# Patient Record
Sex: Female | Born: 1945 | Race: White | Hispanic: No | Marital: Married | Smoking: Current some day smoker
Health system: Southern US, Community
[De-identification: ages and names within clinical notes are randomized; demographics above are authoritative.]

## PROBLEM LIST (undated history)

## (undated) DIAGNOSIS — J449 Chronic obstructive pulmonary disease, unspecified: Secondary | ICD-10-CM

## (undated) DIAGNOSIS — B9681 Helicobacter pylori [H. pylori] as the cause of diseases classified elsewhere: Secondary | ICD-10-CM

## (undated) DIAGNOSIS — I2510 Atherosclerotic heart disease of native coronary artery without angina pectoris: Secondary | ICD-10-CM

## (undated) DIAGNOSIS — G894 Chronic pain syndrome: Secondary | ICD-10-CM

## (undated) DIAGNOSIS — G4733 Obstructive sleep apnea (adult) (pediatric): Secondary | ICD-10-CM

## (undated) DIAGNOSIS — K2970 Gastritis, unspecified, without bleeding: Secondary | ICD-10-CM

## (undated) DIAGNOSIS — B1920 Unspecified viral hepatitis C without hepatic coma: Secondary | ICD-10-CM

## (undated) DIAGNOSIS — I10 Essential (primary) hypertension: Secondary | ICD-10-CM

## (undated) DIAGNOSIS — I255 Ischemic cardiomyopathy: Secondary | ICD-10-CM

## (undated) HISTORY — DX: Helicobacter pylori (H. pylori) as the cause of diseases classified elsewhere: B96.81

## (undated) HISTORY — PX: TONSILLECTOMY: SUR1361

## (undated) HISTORY — DX: Chronic obstructive pulmonary disease, unspecified: J44.9

## (undated) HISTORY — DX: Chronic pain syndrome: G89.4

## (undated) HISTORY — DX: Unspecified viral hepatitis C without hepatic coma: B19.20

## (undated) HISTORY — DX: Gastritis, unspecified, without bleeding: K29.70

## (undated) HISTORY — DX: Ischemic cardiomyopathy: I25.5

## (undated) HISTORY — PX: TOTAL VAGINAL HYSTERECTOMY: SHX2548

## (undated) HISTORY — PX: OTHER SURGICAL HISTORY: SHX169

## (undated) HISTORY — DX: Obstructive sleep apnea (adult) (pediatric): G47.33

## (undated) HISTORY — PX: APPENDECTOMY: SHX54

## (undated) SURGERY — COLONOSCOPY WITH PROPOFOL
Anesthesia: Monitor Anesthesia Care

---

## 2002-02-07 HISTORY — PX: CARDIAC DEFIBRILLATOR PLACEMENT: SHX171

## 2002-10-09 DIAGNOSIS — I2510 Atherosclerotic heart disease of native coronary artery without angina pectoris: Secondary | ICD-10-CM

## 2002-10-09 HISTORY — DX: Atherosclerotic heart disease of native coronary artery without angina pectoris: I25.10

## 2002-10-28 ENCOUNTER — Inpatient Hospital Stay (HOSPITAL_COMMUNITY): Admission: EM | Admit: 2002-10-28 | Discharge: 2002-11-04 | Payer: Self-pay | Admitting: Internal Medicine

## 2002-10-29 ENCOUNTER — Encounter: Payer: Self-pay | Admitting: Internal Medicine

## 2002-10-31 ENCOUNTER — Encounter: Payer: Self-pay | Admitting: Internal Medicine

## 2002-12-10 ENCOUNTER — Ambulatory Visit (HOSPITAL_COMMUNITY): Admission: RE | Admit: 2002-12-10 | Discharge: 2002-12-11 | Payer: Self-pay | Admitting: Internal Medicine

## 2004-03-02 ENCOUNTER — Ambulatory Visit: Payer: Self-pay | Admitting: Internal Medicine

## 2004-03-02 ENCOUNTER — Ambulatory Visit: Payer: Self-pay

## 2004-07-15 ENCOUNTER — Ambulatory Visit: Payer: Self-pay | Admitting: Cardiology

## 2004-07-19 ENCOUNTER — Ambulatory Visit: Payer: Self-pay | Admitting: Internal Medicine

## 2004-09-23 ENCOUNTER — Ambulatory Visit: Payer: Self-pay | Admitting: *Deleted

## 2005-01-06 ENCOUNTER — Ambulatory Visit: Payer: Self-pay

## 2005-05-31 ENCOUNTER — Ambulatory Visit: Payer: Self-pay | Admitting: Cardiology

## 2006-02-23 ENCOUNTER — Ambulatory Visit: Payer: Self-pay | Admitting: Cardiology

## 2006-03-20 ENCOUNTER — Ambulatory Visit: Payer: Self-pay | Admitting: Cardiology

## 2006-03-27 ENCOUNTER — Ambulatory Visit: Payer: Self-pay | Admitting: Cardiology

## 2006-04-21 ENCOUNTER — Ambulatory Visit: Payer: Self-pay | Admitting: Internal Medicine

## 2006-04-25 ENCOUNTER — Ambulatory Visit: Payer: Self-pay | Admitting: Cardiology

## 2006-09-07 ENCOUNTER — Encounter: Payer: Self-pay | Admitting: Cardiology

## 2006-09-07 ENCOUNTER — Ambulatory Visit: Payer: Self-pay | Admitting: Cardiology

## 2006-09-11 ENCOUNTER — Ambulatory Visit: Payer: Self-pay | Admitting: Cardiology

## 2006-11-20 ENCOUNTER — Ambulatory Visit: Payer: Self-pay | Admitting: Cardiology

## 2006-11-21 ENCOUNTER — Ambulatory Visit: Payer: Self-pay | Admitting: Cardiology

## 2006-11-21 ENCOUNTER — Encounter: Payer: Self-pay | Admitting: Cardiology

## 2006-12-12 ENCOUNTER — Ambulatory Visit: Payer: Self-pay | Admitting: Cardiology

## 2007-04-27 ENCOUNTER — Ambulatory Visit: Payer: Self-pay | Admitting: Internal Medicine

## 2007-06-19 ENCOUNTER — Ambulatory Visit: Payer: Self-pay | Admitting: Cardiology

## 2008-01-29 ENCOUNTER — Ambulatory Visit: Payer: Self-pay | Admitting: Cardiology

## 2008-03-25 ENCOUNTER — Encounter: Payer: Self-pay | Admitting: Internal Medicine

## 2008-05-03 ENCOUNTER — Encounter: Payer: Self-pay | Admitting: Cardiology

## 2008-06-23 ENCOUNTER — Encounter: Payer: Self-pay | Admitting: Cardiology

## 2008-06-30 ENCOUNTER — Encounter: Payer: Self-pay | Admitting: Cardiology

## 2008-07-14 ENCOUNTER — Encounter: Payer: Self-pay | Admitting: Cardiology

## 2008-11-06 ENCOUNTER — Telehealth: Payer: Self-pay | Admitting: Internal Medicine

## 2008-11-06 DIAGNOSIS — I10 Essential (primary) hypertension: Secondary | ICD-10-CM | POA: Insufficient documentation

## 2008-11-06 DIAGNOSIS — I429 Cardiomyopathy, unspecified: Secondary | ICD-10-CM | POA: Insufficient documentation

## 2008-11-06 DIAGNOSIS — E785 Hyperlipidemia, unspecified: Secondary | ICD-10-CM

## 2008-11-19 ENCOUNTER — Ambulatory Visit: Payer: Self-pay

## 2008-11-20 ENCOUNTER — Ambulatory Visit: Payer: Self-pay | Admitting: Internal Medicine

## 2008-11-27 ENCOUNTER — Ambulatory Visit (HOSPITAL_COMMUNITY): Admission: RE | Admit: 2008-11-27 | Discharge: 2008-11-27 | Payer: Self-pay | Admitting: Internal Medicine

## 2008-11-27 ENCOUNTER — Ambulatory Visit: Payer: Self-pay | Admitting: Internal Medicine

## 2008-12-03 ENCOUNTER — Ambulatory Visit: Payer: Self-pay | Admitting: Internal Medicine

## 2009-01-05 ENCOUNTER — Telehealth (INDEPENDENT_AMBULATORY_CARE_PROVIDER_SITE_OTHER): Payer: Self-pay | Admitting: *Deleted

## 2009-02-11 ENCOUNTER — Encounter: Payer: Self-pay | Admitting: Internal Medicine

## 2009-04-27 ENCOUNTER — Telehealth: Payer: Self-pay | Admitting: Internal Medicine

## 2009-04-28 ENCOUNTER — Encounter (INDEPENDENT_AMBULATORY_CARE_PROVIDER_SITE_OTHER): Payer: Self-pay | Admitting: *Deleted

## 2009-04-28 ENCOUNTER — Ambulatory Visit: Payer: Self-pay | Admitting: Cardiology

## 2009-04-28 DIAGNOSIS — M79609 Pain in unspecified limb: Secondary | ICD-10-CM | POA: Insufficient documentation

## 2009-04-28 DIAGNOSIS — Z95810 Presence of automatic (implantable) cardiac defibrillator: Secondary | ICD-10-CM | POA: Insufficient documentation

## 2009-04-28 DIAGNOSIS — I2510 Atherosclerotic heart disease of native coronary artery without angina pectoris: Secondary | ICD-10-CM | POA: Insufficient documentation

## 2009-04-28 DIAGNOSIS — R072 Precordial pain: Secondary | ICD-10-CM | POA: Insufficient documentation

## 2009-05-05 ENCOUNTER — Encounter: Payer: Self-pay | Admitting: Cardiology

## 2009-05-05 ENCOUNTER — Ambulatory Visit: Payer: Self-pay | Admitting: Cardiology

## 2009-05-11 ENCOUNTER — Encounter (INDEPENDENT_AMBULATORY_CARE_PROVIDER_SITE_OTHER): Payer: Self-pay | Admitting: *Deleted

## 2009-05-13 ENCOUNTER — Telehealth (INDEPENDENT_AMBULATORY_CARE_PROVIDER_SITE_OTHER): Payer: Self-pay | Admitting: *Deleted

## 2009-06-17 ENCOUNTER — Encounter: Payer: Self-pay | Admitting: Internal Medicine

## 2009-08-05 ENCOUNTER — Ambulatory Visit: Payer: Self-pay | Admitting: Internal Medicine

## 2009-11-06 ENCOUNTER — Ambulatory Visit: Payer: Self-pay | Admitting: Internal Medicine

## 2009-11-06 DIAGNOSIS — I472 Ventricular tachycardia: Secondary | ICD-10-CM

## 2009-12-24 ENCOUNTER — Encounter: Payer: Self-pay | Admitting: Cardiology

## 2009-12-25 ENCOUNTER — Ambulatory Visit: Payer: Self-pay | Admitting: Cardiology

## 2009-12-25 ENCOUNTER — Encounter: Payer: Self-pay | Admitting: Physician Assistant

## 2009-12-28 ENCOUNTER — Encounter: Payer: Self-pay | Admitting: Cardiology

## 2010-01-19 ENCOUNTER — Ambulatory Visit: Payer: Self-pay | Admitting: Cardiology

## 2010-01-20 ENCOUNTER — Encounter: Payer: Self-pay | Admitting: Cardiology

## 2010-03-01 ENCOUNTER — Ambulatory Visit: Admit: 2010-03-01 | Payer: Self-pay | Admitting: Internal Medicine

## 2010-03-09 NOTE — Miscellaneous (Signed)
  Clinical Lists Changes  Observations: Added new observation of NUCLEAR NOS: The nuclear images reveal normal uptake in all segments. There is no scar or ischemia. Wall motion is normal. This is a normal stress nuclear study. (05/05/2009 10:18)      Nuclear Study  Procedure date:  05/05/2009  Findings:      The nuclear images reveal normal uptake in all segments. There is no scar or ischemia. Wall motion is normal. This is a normal stress nuclear study.

## 2010-03-09 NOTE — Letter (Signed)
Summary: Appointment -missed  [facility] at [facility] [address]   [place], [place] [address]   Phone: [phone]  Fax: [phone]     April 28, 2009 MRN: [id number]     [facility] [address] [place], [place]  [address]     Dear [name],  Our records indicate you missed your appointment on April 28, 2009                        with Dr.   Andee Lineman.   It is very important that we reach you to reschedule this appointment. We look forward to participating in your health care needs.   Please contact us at the number listed above at your earliest convenience to reschedule this appointment.   Sincerely,    Glass blower/designer

## 2010-03-09 NOTE — Progress Notes (Signed)
Summary: chest pain  Phone Note Call from Patient Call back at [phone]   Caller: Patient Reason for Call: Talk to Nurse Summary of Call: pt having chest pain since last night, SOB, [place] pt, [place] told pt to call here to get appt tomorrow, pt did get cut off in transfer, home number dc in emr, left message on home # in IDX to call back asap Initial call taken by: Migdalia Dk,  April 27, 2009 4:10 PM  Follow-up for Phone Call        attempted to reach pt, home number no longer in service, work # listed states it is full and can not leave mess at this time, home #in idx is [phone], Left message to call back Meredith Staggers, RN  April 27, 2009 4:27 PM   pt called back, c/o sharpe chest pain off/on , she states at the location of her defib, she states also has increased SOB and had to use her CPAP machine last pm, she is not sure if pain is her heart or defib, also states she has been having some trouble w/fluid and has been taking her furosemide, pt would like an appt w/MD for tom will call pt back w/appt, her call back # is  [phone] Meredith Staggers, RN  April 27, 2009 4:37 PM   Additional Follow-up for Phone Call Additional follow up Details #1::        Feel pt needs to be seen by her reg. cardiologist for eval of chest pain and increased SOB, will forward to Thompson Caul, RN  April 27, 2009 4:54 PM     Additional Follow-up for Phone Call Additional follow up Details #2::    Appt. scheduled for tomorrow at 9:00.  Pt. notified.   Follow-up by: Hoover Brunette, LPN,  April 27, 2009 5:11 PM   Appended Document: chest pain Patient no showed for appointment this morning.

## 2010-03-09 NOTE — Letter (Signed)
Summary: Lexiscan or Dobutamine Pharmacist, community at [facility]  [address]   [place], [place] [address]   Phone: [phone]  Fax: [phone]      [facility] Cardiovascular Services  Lexiscan or Dobutamine Cardiolite Strss Test    [facility]  Appointment Date:_  Appointment Time:_  Your doctor has ordered a CARDIOLITE STRESS TEST using a medication to stimulate exercise so that you will not have to walk on the treadmill to determine the condition of your heart during stress. If you take blood pressure medication, ask your doctor if you should take it the day of your test. You should not have anything to eat or drink at least 4 hours before your test is scheduled, and no caffeine, including decaffeinated tea and coffee, chocolate, and soft drinks for 24 hours before your test.  You will need to register at the Outpatient/Main Entrance at the hospital 15 minutes before your appointment time. It is a good idea to bring a copy of your order with you. They will direct you to the Diagnostic Imaging (Radiology) Department.  You will be asked to undress from the waist up and given a hospital gown to wear, so dress comfortably from the waist down for example: Sweat pants, shorts, or skirt Rubber soled lace up shoes (tennis shoes)  Plan on about three hours from registration to release from the hospital

## 2010-03-09 NOTE — Progress Notes (Signed)
Summary: Pending ABI's   Phone Note Outgoing Call Call back at [facility] Phone [phone]   Call placed by: Cyril Loosen, RN, BSN,  May 13, 2009 1:30 PM Call placed to: Patient Summary of Call: Contacted pt regarding appt for dopplers that was scheduled for April 4th. Pt states she did not have this done b/c she has a cold. She would like it rescheduled for one day next week if possible. Notified Lynden Ang will r/s and notify her of date and time.  Initial call taken by: Cyril Loosen, RN, BSN,  May 13, 2009 1:33 PM     Appended Document: Pending ABI's  Called Mrs. Lieurance and left voice message asking that she call me back so that I can re-schedule her test.

## 2010-03-09 NOTE — Letter (Signed)
Summary: Engineer, materials at [facility]  [address]   [place], [place] [address]   Phone: [phone]  Fax: [phone]        May 11, 2009 MRN: [id number]   [facility] [address] [place], [place]  [address]   Dear Ms. Cornfield,  Your test ordered by Selena Batten has been reviewed by your physician (or physician assistant) and was found to be normal or stable. Your physician (or physician assistant) felt no changes were needed at this time.  ____ Echocardiogram  __X__ Cardiac Stress Test  ____ Lab Work  ____ Peripheral vascular study of arms, legs or neck  ____ CT scan or X-ray  ____ Lung or Breathing test  ____ Other:   Thank you.   Hoover Brunette, LPN    Duane Boston, M.D., F.A.C.C. Thressa Sheller, M.D., F.A.C.C. Oneal Grout, M.D., F.A.C.C. Cheree Ditto, M.D., F.A.C.C. Daiva Nakayama, M.D., F.A.C.C. Kenney Houseman, M.D., F.A.C.C. Jeanne Ivan, PA-C

## 2010-03-09 NOTE — Letter (Signed)
Summary: Appointment -missed  [facility] at [facility] [address]   [place], [place] [address]   Phone: [phone]  Fax: [phone]     February 11, 2009 MRN: [id number]     [facility] [address] [place], [place]  [address]     Dear Ms. Gu,  Our records indicate you missed your appointment on February 11, 2009                        with Dr. Abundio Miu.   It is very important that we reach you to reschedule this appointment. We look forward to participating in your health care needs.   Please contact us at the number listed above at your earliest convenience to reschedule this appointment.   Sincerely,    Glass blower/designer

## 2010-03-09 NOTE — Assessment & Plan Note (Signed)
Summary: 3 month in [place] office/sl   Visit Type:  Pacemaker check Primary Provider:  none   History of Present Illness: [name] is a 65 year old female with a history of a mixed ischemic/nonischemic cardiomyopathy. Initially her EF was 19% and she had a Medtronic ICD implant in 2004. Her ejection fraction however is improved to 55% by echo in January 2008. She also normal adenosine Cardiolite study 3/11 with ejection fraction of 59%.  I recently replaced her ICD pulse generator for ERI battery status.   She received a Medtronic Virtuoso VR single chamber cardioverter defibrillator. She does have a 6949 lead and declined replacement of this. She has been made aware of potential defibrillator discharges and has a magnet at home. Presently she is doing well.  She reports no CP, SOB, orthopnea, or PND.   Preventive Screening-Counseling & Management  Alcohol-Tobacco     Smoking Status: quit > 6 months  Current Medications (verified): 1)  Carvedilol 12.5 Mg Tabs (Carvedilol) .... Take 1 Tablet By Mouth Two Times A Day 2)  Furosemide 20 Mg Tabs (Furosemide) .... Take 1 Tablet By Mouth Twice A Day 3)  Lisinopril 2.5 Mg Tabs (Lisinopril) .Marland Kitchen.. 1 Tablet By Mouth Once A Day (Not Compliant) As Needed 4)  Plavix 75 Mg Tabs (Clopidogrel Bisulfate) .... Take 1 Tablet By Mouth Once A Day 5)  Opana Er 20 Mg Xr12h-Tab (Oxymorphone Hcl) .... Take 1 By Mouth Two Times A Day 6)  Flexeril 10 Mg Tabs (Cyclobenzaprine Hcl) .... Take 1 By Mouth Two Times A Day As Needed 7)  Fish Oil 1000 Mg Caps (Omega-3 Fatty Acids) .... Take 2 By Mouth Once Daily 8)  Roxicodone 15 Mg Tabs (Oxycodone Hcl) .... Take 1 Tablet By Mouth Two Times A Day 9)  Centrum Silver  Tabs (Multiple Vitamins-Minerals) .... Take 1 Tablet By Mouth Once A Day 10)  Xanax 1 Mg Tabs (Alprazolam) .... Uad  Allergies (verified): 1)  ! Asa 2)  ! Pcn 3)  ! Lipitor (Atorvastatin) 4)  ! Zocor  Comments:  Nurse/Medical Assistant: The patient is  currently on medications but does not know the name or dosage at this time. Instructed to contact our office with details. Will update medication list at that time.  Past History:  Past Medical History: Reviewed history from 11/20/2008 and no changes required. HYPERTENSION, UNSPECIFIED (ICD-401.9) HYPERLIPIDEMIA-MIXED (ICD-272.4) CARDIOMYOPATHY, SECONDARY (ICD-425.9) Ventricular tachycardia, s/p ICD implantation OSA Chronic pain syndrome.  Heavy narcotic use COPD/ongoing tobacco.   Past Surgical History: Reviewed history from 11/06/2008 and no changes required. forearm reatachment Abdominal Hysterectomy-Total ICD - medtronic marquis  Social History: Reviewed history from 11/06/2008 and no changes required. Married  Tobacco Use - Yes.  Alcohol Use - no Drug Use - no Smoking Status:  quit > 6 months  Review of Systems       All systems are reviewed and negative except as listed in the HPI.   Vital Signs:  Patient profile:   65 year old female Height:      61 inches Weight:      151 pounds Pulse rate:   91 / minute BP sitting:   102 / 65  (left arm) Cuff size:   regular  Vitals Entered By: Carlye Grippe (November 06, 2009 3:51 PM)  Physical Exam  General:  lethargic on exam but interactive Head:  normocephalic and atraumatic Eyes:  meiotic pupils Mouth:  Teeth, gums and palate normal. Oral mucosa normal. Neck:  Neck supple, no JVD. No masses,  thyromegaly or abnormal cervical nodes. Chest Wall:  L ICD pocket is well healed, nontender on exam Lungs:  Clear bilaterally to auscultation and percussion. Heart:  Non-displaced PMI, chest non-tender; regular rate and rhythm, S1, S2 without murmurs, rubs or gallops. Carotid upstroke normal, no bruit. Normal abdominal aortic size, no bruits. Femorals normal pulses, no bruits. Pedals normal pulses. No edema, no varicosities. Abdomen:  Bowel sounds positive; abdomen soft and non-tender without masses, organomegaly, or hernias  noted. No hepatosplenomegaly. Msk:  Back normal, normal gait. L arm s/p surgery with major chronic deformity and chronic pain Pulses:  pulses normal in all 4 extremities Extremities:  No clubbing or cyanosis. Neurologic:  Alert and oriented x 3.    ICD Specifications Following MD:  Hillis Range, MD     ICD Vendor:  Medtronic     ICD Model Number:  [id number]     ICD Serial Number:  [id number] ICD DOI:  11/27/2008     ICD Implanting MD:  Hillis Range, MD  Lead 1:    Location: RV     DOI: 12/10/2002     Model #: [id number]     Serial #: [id number]     Status: active  Indications::  NSVT LIA  Explantation Comments: 11/27/2008 MARQUIS [id number] h explanted.  5621 LEAD REVISION REFUSED  ICD Follow Up Battery Voltage:  3.20 V     Charge Time:  8.7 seconds     Underlying rhythm:  SR ICD Dependent:  No       ICD Device Measurements Right Ventricle:  Amplitude: 9.0 mV, Impedance: 627 ohms, Threshold: 0.50 V at 0.40 msec Shock Impedance: 44/59 ohms   Episodes [name] Episodes:  0     Shock:  0     ATP:  0     Nonsustained:  6     Atrial Therapies:  0 Ventricular Pacing:  <0.1%  Brady Parameters Mode VVI     Lower Rate Limit:  40      Tachy Zones VF:  222     VT1:  182     Next Cardiology Appt Due:  01/11/2010 Tech Comments:  OPTIVOL INCREASE FROM 10-26-09 THRU ONGOING.  6 MONITORED VT EPISODES--LONGEST WAS 6 SECONDS.  CHANGED VF ZONE TO 222 AND VT ZONE TO 182 bpm.  6949 LEAD STABLE. SIC 0.  DEMONSTRATED TONES FOR PT.  PT HAS LETTER AND MAGNET.  ROV IN 3 MTHS W/DEVICE CLINIC. Vella Kohler  November 06, 2009 4:11 PM MD Comments:  Pt had VT (CL 300-330 [name]) which self terminated as well as nonsustained VT in the VF zone (CL 260-244ms) which self terminated.  I have reprogrammed today to turn the VT therapies on for CL <330 msec.  No evidence of fidelis lead issues.  Impression & Recommendations:  Problem # 1:  IMPLANTATION OF DEFIBRILLATOR, HX OF (ICD-V45.02) Normal ICD function. 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 She is euvolemic on exam  no changes today  Problem # 4:  HYPERTENSION, UNSPECIFIED (ICD-401.9) stable  Patient Instructions: 1)  return to the device clinic in 3 months

## 2010-03-09 NOTE — Cardiovascular Report (Signed)
Summary: Card Device Clinic/ INTERROGATION QUICK LOOK  Card Device Clinic/ INTERROGATION QUICK LOOK   Imported By: Dorise Hiss 11/09/2009 11:52:05  _____________________________________________________________________  External Attachment:    Type:   Image     Comment:   External Document

## 2010-03-09 NOTE — Assessment & Plan Note (Signed)
Summary: ROV-CHEST PAIN   Visit Type:  Follow-up Primary Provider:  Hasanaj  CC:  follow-up visit.  History of Present Illness: the patient is a 65 year old femalehistory of a mixed ischemic/nonischemic cardiomyopathy. Initially showed an ejection fraction of 19% and since he received a Medtronic ICD implant in 2004. Her ejection fraction however is improved to 55% by echo in January 2008. She also normal adenosine Cardiolite study with ejection fraction of 50% November 2008. She is a chronic pain syndrome which pain medication addiction. She also COPD with ongoing tobacco use. She statin intolerant.  Patient has a recent generator replacement. She received a Medtronic Virtuoso VR single chamber cardioverter defibrillator. She does have a 6949 lead and declined for placement of this. She has been made aware of potential defibrillator discharges and has a magnet at home X.  The patient currently reports substernal chest pain. She reports no orthopnea PND. She does report increased shortness of breath.she denies any palpitations orthopnea or PND. The patient is very lethargic in the office today.the patient reports pain in the lower extremities upon exertion. She also reports dizziness with carvedilol.  Preventive Screening-Counseling & Management  Alcohol-Tobacco     Smoking Status: current     Smoking Cessation Counseling: yes     Packs/Day: <0.25 PPD  Current Medications (verified): 1)  Carvedilol 12.5 Mg Tabs (Carvedilol) .... Take 1 Tablet By Mouth Two Times A Day 2)  Furosemide 20 Mg Tabs (Furosemide) .... Take 1 Tablet By Mouth Twice A Day 3)  Lisinopril 2.5 Mg Tabs (Lisinopril) .Marland Kitchen.. 1 Tablet By Mouth Once A Day (Not Compliant) As Needed 4)  Plavix 75 Mg Tabs (Clopidogrel Bisulfate) .... Take 1 Tablet By Mouth Once A Day 5)  Opana Er 20 Mg Xr12h-Tab (Oxymorphone Hcl) .... Take 1 By Mouth Two Times A Day 6)  Flexeril 10 Mg Tabs (Cyclobenzaprine Hcl) .... Take 1 By Mouth Two Times A  Day As Needed 7)  Fish Oil 1000 Mg Caps (Omega-3 Fatty Acids) .... Take 2 By Mouth Once Daily 8)  Roxicodone 15 Mg Tabs (Oxycodone Hcl) .... Take 1 Tablet By Mouth Two Times A Day 9)  Centrum Silver  Tabs (Multiple Vitamins-Minerals) .... Take 1 Tablet By Mouth Once A Day  Allergies (verified): 1)  ! Asa 2)  ! Pcn 3)  ! Lipitor (Atorvastatin) 4)  ! Zocor  Comments:  Nurse/Medical Assistant: The patient's medications and allergies were reviewed with the patient and were updated in the Medication and Allergy Lists. Verbally gave names.  Past History:  Past Medical History: Last updated: 11/20/2008 HYPERTENSION, UNSPECIFIED (ICD-401.9) HYPERLIPIDEMIA-MIXED (ICD-272.4) CARDIOMYOPATHY, SECONDARY (ICD-425.9) Ventricular tachycardia, s/p ICD implantation OSA Chronic pain syndrome.  Heavy narcotic use COPD/ongoing tobacco.   Past Surgical History: Last updated: 11/06/2008 forearm reatachment Abdominal Hysterectomy-Total ICD - medtronic marquis  Family History: Last updated: 11/20/2008 CAD  Social History: Last updated: 11/06/2008 Married  Tobacco Use - Yes.  Alcohol Use - no Drug Use - no  Risk Factors: Smoking Status: current (04/28/2009) Packs/Day: <0.25 PPD (04/28/2009)  Social History: Packs/Day:  <0.25 PPD  Review of Systems       The patient complains of chest pain, shortness of breath, and dizziness.  The patient denies fatigue, malaise, fever, weight gain/loss, vision loss, decreased hearing, hoarseness, palpitations, prolonged cough, wheezing, sleep apnea, coughing up blood, abdominal pain, blood in stool, nausea, vomiting, diarrhea, heartburn, incontinence, blood in urine, muscle weakness, joint pain, leg swelling, rash, skin lesions, headache, fainting, depression, anxiety, enlarged lymph nodes, easy  bruising or bleeding, and environmental allergies.    Vital Signs:  Patient profile:   65 year old female Height:      61 inches Weight:      157  pounds O2 Sat:      976 % Pulse rate:   91 / minute BP sitting:   108 / 72  (right arm) Cuff size:   regular  Vitals Entered By: Carlye Grippe (April 28, 2009 1:54 PM) CC: follow-up visit   Physical Exam  Additional Exam:  General: Somnolent white female in no distress head: Normocephalic and atraumatic eyes PERRLA/EOMI intact, conjunctiva and lids normal nose: No deformity or lesions mouth normal dentition, normal posterior pharynx neck: Supple, no JVD.  No masses, thyromegaly or abnormal cervical nodes lungs: Normal breath sounds bilaterally without wheezing.  Normal percussion Chest: Well-seated defibrillator implant without any erythema or swelling heart: regular rate and rhythm with normal S1 and S2, no S3 or S4.  PMI is normal.  No pathological murmurs abdomen: Normal bowel sounds, abdomen is soft and nontender without masses, organomegaly or hernias noted.  No hepatosplenomegaly musculoskeletal: Back normal, normal gait muscle strength and tone normal pulsus: difficult to palpate pulses dorsalis pedis and posterior tibial pulses Extremities: No peripheral pitting edema neurologic: Alert and oriented x 3 skin: Intact without lesions or rashes cervical nodes: No significant adenopathy psychologic: Normal affect     ICD Specifications Following MD:  Hillis Range, MD     ICD Vendor:  Medtronic     ICD Model Number:  [id number]     ICD Serial Number:  [id number] ICD DOI:  11/27/2008     ICD Implanting MD:  Hillis Range, MD  Lead 1:    Location: RV     DOI: 12/10/2002     Model #: [id number]     Serial #: [id number]     Status: active  Indications::  NSVT LIA  Explantation Comments: 11/27/2008 MARQUIS 7230/[id number] h explanted.  4854 LEAD REVISION REFUSED  ICD Follow Up ICD Dependent:  No      Brady Parameters Mode VVI     Lower Rate Limit:  40      Tachy Zones VF:  188     VT1:  162     Impression & Recommendations:  Problem # 1:  LEG PAIN, BILATERAL (ICD-729.5) the  patient has multiple risk factors for peripheral arterial disease. We have scheduled ABIs. 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 Take 1 tablet by mouth two times a day    Lisinopril 2.5 Mg Tabs (Lisinopril) .Marland Kitchen... 1 tablet by mouth once a day (not compliant) as needed    Plavix 75 Mg Tabs (Clopidogrel bisulfate) .Marland Kitchen... Take 1 tablet by mouth once a day  Orders: Nuclear Med (Nuc Med)  Problem # 3:  CARDIOMYOPATHY, SECONDARY (ICD-425.9) the patient is unable to tolerate high-dose carvedilol. She reports dizziness with this. I decreased her dosing to 12.5 milligram p.o. b.i.d. Her updated medication list for this problem includes:    Carvedilol 12.5 Mg Tabs (Carvedilol) .Marland Kitchen... Take 1 tablet by mouth two times a day    Furosemide 20 Mg Tabs (Furosemide) .Marland Kitchen... Take 1 tablet by mouth twice a day  Lisinopril 2.5 Mg Tabs (Lisinopril) .Marland Kitchen... 1 tablet by mouth once a day (not compliant) as needed    Plavix 75 Mg Tabs (Clopidogrel bisulfate) .Marland Kitchen... Take 1 tablet by mouth once a day  Orders: EKG w/ Interpretation (93[address])  Problem # 4:  IMPLANTATION OF DEFIBRILLATOR, HX OF (ICD-V45.02) 6949-lead  Patient Instructions: 1)  Lexiscan on meds 2)  ABI's  3)  Decrease Coreg to 12.5mg  two times a day  4)  Follow up in  6 months Prescriptions: CARVEDILOL 12.5 MG TABS (CARVEDILOL) Take 1 tablet by mouth two times a day  #60 x 6   Entered by:   Hoover Brunette, LPN   Authorized by:   Lewayne Bunting, MD, [facility]   Signed by:   Hoover Brunette, LPN on 25/36/6440   Method used:   Electronically to        [facility] [address] (retail)       [address]       [place], [place]  [address]       Ph: [phone] or [phone]       Fax: [phone]   RxID:   [id number]

## 2010-03-09 NOTE — Assessment & Plan Note (Signed)
Summary: f12m/per [name] Ofc/jss   Visit Type:  Follow-up Primary Provider:  Hasanaj   History of Present Illness: [name] is a 65 year old female with a history of a mixed ischemic/nonischemic cardiomyopathy. Initially her EF was 19% and she had a Medtronic ICD implant in 2004. Her ejection fraction however is improved to 55% by echo in January 2008. She also normal adenosine Cardiolite study 3/11 with ejection fraction of 59%.  I recently replaced her ICD pulse generator for ERI battery status.  She received a Medtronic Virtuoso VR single chamber cardioverter defibrillator. She does have a 6949 lead and declined replacement of this. She has been made aware of potential defibrillator discharges and has a magnet at home. Presently she is doing well.  She reports no CP, SOB, orthopnea, or PND.   Current Medications (verified): 1)  Carvedilol 12.5 Mg Tabs (Carvedilol) .... Take 1 Tablet By Mouth Two Times A Day 2)  Furosemide 20 Mg Tabs (Furosemide) .... Take 1 Tablet By Mouth Twice A Day 3)  Lisinopril 2.5 Mg Tabs (Lisinopril) .Marland Kitchen.. 1 Tablet By Mouth Once A Day (Not Compliant) As Needed 4)  Plavix 75 Mg Tabs (Clopidogrel Bisulfate) .... Take 1 Tablet By Mouth Once A Day 5)  Opana Er 20 Mg Xr12h-Tab (Oxymorphone Hcl) .... Take 1 By Mouth Two Times A Day 6)  Flexeril 10 Mg Tabs (Cyclobenzaprine Hcl) .... Take 1 By Mouth Two Times A Day As Needed 7)  Fish Oil 1000 Mg Caps (Omega-3 Fatty Acids) .... Take 2 By Mouth Once Daily 8)  Roxicodone 15 Mg Tabs (Oxycodone Hcl) .... Take 1 Tablet By Mouth Two Times A Day 9)  Centrum Silver  Tabs (Multiple Vitamins-Minerals) .... Take 1 Tablet By Mouth Once A Day 10)  Xanax 1 Mg Tabs (Alprazolam) .... Uad  Allergies: 1)  ! Asa 2)  ! Pcn 3)  ! Lipitor (Atorvastatin) 4)  ! Zocor  Past History:  Past Medical History: Reviewed history from 11/20/2008 and no changes required. HYPERTENSION, UNSPECIFIED (ICD-401.9) HYPERLIPIDEMIA-MIXED  (ICD-272.4) CARDIOMYOPATHY, SECONDARY (ICD-425.9) Ventricular tachycardia, s/p ICD implantation OSA Chronic pain syndrome.  Heavy narcotic use COPD/ongoing tobacco.   Past Surgical History: Reviewed history from 11/06/2008 and no changes required. forearm reatachment Abdominal Hysterectomy-Total ICD - medtronic marquis  Social History: Reviewed history from 11/06/2008 and no changes required. Married  Tobacco Use - Yes.  Alcohol Use - no Drug Use - no  Vital Signs:  Patient profile:   65 year old female Height:      61 inches Weight:      154 pounds BMI:     29.20 O2 Sat:      91 % Pulse rate:   95 / minute BP sitting:   140 / 80  (left arm)  Vitals Entered By: Laurance Flatten CMA (August 05, 2009 11:26 AM)  Physical Exam  General:  lethargic on exam but interactive Head:  normocephalic and atraumatic Eyes:  meiotic pupils Mouth:  Teeth, gums and palate normal. Oral mucosa normal. Neck:  Neck supple, no JVD. No masses, thyromegaly or abnormal cervical nodes. Chest Wall:  L ICD pocket is well healed, nontender on exam Lungs:  Clear bilaterally to auscultation and percussion. Heart:  Non-displaced PMI, chest non-tender; regular rate and rhythm, S1, S2 without murmurs, rubs or gallops. Carotid upstroke normal, no bruit. Normal abdominal aortic size, no bruits. Femorals normal pulses, no bruits. Pedals normal pulses. No edema, no varicosities. Abdomen:  Bowel sounds positive; abdomen soft and non-tender without masses,  organomegaly, or hernias noted. No hepatosplenomegaly. Msk:  Back normal, normal gait. Muscle strength and tone normal. Pulses:  pulses normal in all 4 extremities Extremities:  No clubbing or cyanosis.   EKG  Procedure date:  08/05/2009  Findings:      sinus rhythm 89 bpm, nonspecific ST/T changes   ICD Specifications Following MD:  [name] Range, MD     ICD Vendor:  Medtronic     ICD Model Number:  [id number]     ICD Serial Number:  [id number] H ICD DOI:   11/27/2008     ICD Implanting MD:  [name] Range, MD  Lead 1:    Location: RV     DOI: 12/10/2002     Model #: [id number]     Serial #: [id number]     Status: active  Indications::  NSVT LIA  Explantation Comments: 11/27/2008 MARQUIS [id number] h explanted.  5621 LEAD REVISION REFUSED  ICD Follow Up Remote Check?  No Battery Voltage:  3.20 V     Charge Time:  8.7 seconds     ICD Dependent:  No       ICD Device Measurements Right Ventricle:  Amplitude: 7.4 mV, Impedance: 589 ohms, Threshold: 0.5 V at 0.4 msec Shock Impedance: 40/55 ohms   Episodes Shock:  0     ATP:  0     Nonsustained:  23     Ventricular Pacing:  <0.1%  Brady Parameters Mode VVI     Lower Rate Limit:  40      Tachy Zones VF:  200     VT1:  OFF     Next Cardiology Appt Due:  10/08/2009 Tech Comments:  Normal device function.  No changes made today.  Longest NSVT episode 10 seconds.  Pt without symptoms.  [phone] lead stable.  ROV 3 months [facility]. Gypsy Balsam, RN, BSN MD Comments:  agree  Impression & Recommendations:  Problem # 1:  CARDIOMYOPATHY, SECONDARY (ICD-425.9) Stable without significant volume overload on exam.   She has chronic systolic dysfunction, though a recent myoview was without evidence of ischemia and revealed normalization of EF. Normal ICD function today.  No changes  Problem # 2:  HYPERTENSION, UNSPECIFIED (ICD-401.9) stable  Patient Instructions: 1)  Your physician recommends that you schedule a follow-up appointment in: 3 months with Dr Johney Frame in [place] Prescriptions: LISINOPRIL 2.5 MG TABS (LISINOPRIL) 1 tablet by mouth once a day (NOT COMPLIANT) as needed  #30 x 6   Entered by:   Dennis Bast, RN, BSN   Authorized by:   [name] Range, MD   Signed by:   Dennis Bast, RN, BSN on 08/05/2009   Method used:   Electronically to        [facility] [address] (retail)       [address]       [place], [place]  [address]       Ph: [phone] or  [phone]       Fax: [phone]   RxID:   [id number]

## 2010-03-11 NOTE — Letter (Signed)
Summary: [facility] D/C DR. HASANAJ  [facility] D[facility]C DR. HASANAJ   Imported By: Zachary George 01/19/2010 08:43:13  _____________________________________________________________________  External Attachment:    Type:   Image     Comment:   External Document

## 2010-03-11 NOTE — Consult Note (Signed)
Summary: CARDIOLOGY CONSULT/ [facility]  CARDIOLOGY CONSULT/ [facility]   Imported By: Zachary George 01/19/2010 08:38:52  _____________________________________________________________________  External Attachment:    Type:   Image     Comment:   External Document

## 2010-03-11 NOTE — Letter (Signed)
Summary: Appointment -missed  [facility] [address]   [place], [place] [address]   Phone: [phone]  Fax: [phone]     January 19, 2010 MRN: [id number]    [facility] [address] [place], [place]  [address]    Dear Ms. Horsfall,  Our records indicate you missed your appointment on January 19, 2010                        with Dr.  Andee Lineman.   It is very important that we reach you to reschedule this appointment. We look forward to participating in your health care needs.   Please contact us at the number listed above at your earliest convenience to reschedule this appointment.   Sincerely,    Glass blower/designer

## 2010-03-12 ENCOUNTER — Telehealth (INDEPENDENT_AMBULATORY_CARE_PROVIDER_SITE_OTHER): Payer: Self-pay | Admitting: *Deleted

## 2010-03-17 NOTE — Progress Notes (Signed)
Summary: Wants to know name of company to help in home  Phone Note Call from Patient Call back at [phone]   Summary of Call: Pt left message on voicemail stating she would like to know the phone number to an agency that helps in the home.   Returned call but received voicemail that was full.  Initial call taken by: Cyril Loosen, RN, BSN,  March 12, 2010 11:59 AM  Follow-up for Phone Call        Pt wanted to know an agency who could help in the home. Notified pt should could contact counsel on aging (or it's equivalent in [place]) for this.  Pt wanted to know if Gene had found out why her ICD fired when she was in the hospital several months ago. Pt notified she should schedule f/u appts with Drs Earnestine Leys and Allred as she no showed for her last appts with both providers.  Pt scheduled with Dr. Johney Frame on Monday, March 5th at 2pm and Dr. Earnestine Leys on Wednesday, April 21, 2010 at 10:30am. Pt confirmed both appts. Follow-up by: Cyril Loosen, RN, BSN,  March 12, 2010 12:09 PM

## 2010-04-12 ENCOUNTER — Encounter: Payer: Self-pay | Admitting: Internal Medicine

## 2010-04-12 ENCOUNTER — Encounter (INDEPENDENT_AMBULATORY_CARE_PROVIDER_SITE_OTHER): Payer: Medicare Other | Admitting: Internal Medicine

## 2010-04-12 DIAGNOSIS — I428 Other cardiomyopathies: Secondary | ICD-10-CM

## 2010-04-12 DIAGNOSIS — F17200 Nicotine dependence, unspecified, uncomplicated: Secondary | ICD-10-CM | POA: Insufficient documentation

## 2010-04-12 DIAGNOSIS — I10 Essential (primary) hypertension: Secondary | ICD-10-CM

## 2010-04-12 DIAGNOSIS — I5022 Chronic systolic (congestive) heart failure: Secondary | ICD-10-CM

## 2010-04-20 NOTE — Assessment & Plan Note (Signed)
Summary: DEVICE CHECK-NO SHOWED FOR LAST APPT-MEDTRONIC-JM   Visit Type:  Follow-up Primary Provider:  Dr. Olena Leatherwood   History of Present Illness: The patient presents today for routine electrophysiology followup. She reports doing very well since last being seen in our clinic.  She did receive ICD shocks for T wave oversensing 11/11.  Her sensitivity was adjusted to 0.6 (DFT testing done at 1.68mv) and this has resolved.  The patient denies symptoms of palpitations, chest pain, shortness of breath, orthopnea, PND, lower extremity edema, dizziness, presyncope, syncope, or neurologic sequela. The patient is tolerating medications without difficulties and is otherwise without complaint today.   Preventive Screening-Counseling & Management  Alcohol-Tobacco     Smoking Status: current     Packs/Day: 1/day - 1 / week  Comments: very seldon  Current Medications (verified): 1)  Carvedilol 12.5 Mg Tabs (Carvedilol) .... Take 1 Tablet By Mouth Two Times A Day 2)  Furosemide 20 Mg Tabs (Furosemide) .... Take 1 Tablet By Mouth Twice A Day 3)  Lisinopril 2.5 Mg Tabs (Lisinopril) .Marland Kitchen.. 1 Tablet By Mouth Once A Day (Not Compliant) As Needed 4)  Plavix 75 Mg Tabs (Clopidogrel Bisulfate) .... Take 1 Tablet By Mouth Once A Day 5)  Opana Er 20 Mg Xr12h-Tab (Oxymorphone Hcl) .... Take 1 By Mouth Two Times A Day 6)  Flexeril 10 Mg Tabs (Cyclobenzaprine Hcl) .... Take 1 By Mouth Two Times A Day As Needed 7)  Fish Oil 1000 Mg Caps (Omega-3 Fatty Acids) .... Take 2 By Mouth Once Daily 8)  Roxicodone 15 Mg Tabs (Oxycodone Hcl) .... Take One Tab Four X Day 9)  Centrum Silver  Tabs (Multiple Vitamins-Minerals) .... Take 1 Tablet By Mouth Once A Day 10)  Xanax 1 Mg Tabs (Alprazolam) .... Take One Tab Two Times A Day To Three Times A Day As Needed  Allergies (verified): 1)  ! Asa 2)  ! Pcn 3)  ! Lipitor (Atorvastatin) 4)  ! Zocor  Past History:  Past Medical History: Reviewed history from 11/20/2008 and no  changes required. HYPERTENSION, UNSPECIFIED (ICD-401.9) HYPERLIPIDEMIA-MIXED (ICD-272.4) CARDIOMYOPATHY, SECONDARY (ICD-425.9) Ventricular tachycardia, s/p ICD implantation OSA Chronic pain syndrome.  Heavy narcotic use COPD/ongoing tobacco.   Past Surgical History: Reviewed history from 11/06/2008 and no changes required. forearm reatachment Abdominal Hysterectomy-Total ICD - medtronic marquis  Social History: Reviewed history from 11/06/2008 and no changes required. Married  Tobacco Use - Yes.  Alcohol Use - no Drug Use - no Smoking Status:  current Packs/Day:  1/day - 1 / week  Vital Signs:  Patient profile:   65 year old female Weight:      153.75 pounds Pulse rate:   84 / minute BP sitting:   150 / 86  (right arm) Cuff size:   regular  Vitals Entered By: Hoover Brunette, LPN (April 11, 1608 2:42 PM) Is Patient Diabetic? No   Physical Exam  General:  lethargic on exam but interactive Head:  normocephalic and atraumatic Eyes:  meiotic pupils Mouth:  Teeth, gums and palate normal. Oral mucosa normal. Neck:  Neck supple, no JVD. No masses, thyromegaly or abnormal cervical nodes. Chest Wall:  L ICD pocket is well healed, nontender on exam Lungs:  Clear bilaterally to auscultation and percussion. Heart:  Non-displaced PMI, chest non-tender; regular rate and rhythm, S1, S2 without murmurs, rubs or gallops. Carotid upstroke normal, no bruit. Normal abdominal aortic size, no bruits. Femorals normal pulses, no bruits. Pedals normal pulses. No edema, no varicosities. Abdomen:  Bowel  sounds positive; abdomen soft and non-tender without masses, organomegaly, or hernias noted. No hepatosplenomegaly. Msk:  Back normal, normal gait. L arm s/p surgery with major chronic deformity and chronic pain Extremities:  No clubbing or cyanosis. Neurologic:  Alert and oriented x 3. Skin:  Intact without lesions or rashes. Psych:  Normal affect.    ICD Specifications Following MD:  Hillis Range, MD     ICD Vendor:  Medtronic     ICD Model Number:  [id number]     ICD Serial Number:  [id number] H ICD DOI:  11/27/2008     ICD Implanting MD:  Hillis Range, MD  Lead 1:    Location: RV     DOI: 12/10/2002     Model #: [id number]     Serial #: [id number]     Status: active  Indications::  NSVT LIA  Explantation Comments: 11/27/2008 MARQUIS 7230/[id number] h explanted.  5621 LEAD REVISION REFUSED  ICD Follow Up ICD Dependent:  No      Brady Parameters Mode VVI     Lower Rate Limit:  40      Tachy Zones VF:  222     VT1:  182     MD Comments:  see scanned report  Impression & Recommendations:  Problem # 1:  CARDIOMYOPATHY, SECONDARY (ICD-425.9) The patient has chronic systolic dysfunction.  Her EF has normalized.  No CHF on exam.  Problem # 2:  IMPLANTATION OF DEFIBRILLATOR, HX OF (ICD-V45.02) Normal ICD function today.  [phone] lead is working appropriately.  She did receive ICD shocks 11/11 for T wave oversensing.  Her sensitivity has been adjusted to 0.36ms (DFT testing at 1.63ms).  I also reprogrammed to single zone given normalization of EF to hopefully prevent inappropriate ICD shocks  Problem # 3:  TOBACCO ABUSE (ICD-305.1) cessation advised  Patient Instructions: 1)  Return to device clinic in 3 months 2)  follow-up with Dr Andee Lineman as scheduled

## 2010-04-21 ENCOUNTER — Ambulatory Visit: Payer: Medicare Other | Admitting: Cardiology

## 2010-04-27 NOTE — Cardiovascular Report (Signed)
Summary: Card Device Clinic/  SESSION SUMMARY  Card Device Clinic/  SESSION SUMMARY   Imported By: Dorise Hiss 04/21/2010 09:30:50  _____________________________________________________________________  External Attachment:    Type:   Image     Comment:   External Document

## 2010-05-13 LAB — BASIC METABOLIC PANEL
BUN: 12 mg/dL (ref 6–23)
CO2: 33 mEq/L — ABNORMAL HIGH (ref 19–32)
Calcium: 9.3 mg/dL (ref 8.4–10.5)
Chloride: 98 mEq/L (ref 96–112)
Creatinine, Ser: 1.05 mg/dL (ref 0.4–1.2)
GFR calc Af Amer: >60 mL/min (ref >60)
GFR calc non Af Amer: 53 mL/min — ABNORMAL LOW (ref >60)
Glucose, Bld: 96 mg/dL (ref 70–99)
Potassium: 4.2 mEq/L (ref 3.5–5.1)
Sodium: 140 mEq/L (ref 135–145)

## 2010-05-13 LAB — CBC
HCT: 42.4 % (ref 36.0–46.0)
Hemoglobin: 14.4 g/dL (ref 12.0–15.0)
MCHC: 34 g/dL (ref 30.0–36.0)
MCV: 87.7 fL (ref 78.0–100.0)
Platelets: 179 10*3/uL (ref 150–400)
RBC: 4.83 MIL/uL (ref 3.87–5.11)
RDW: 13.2 % (ref 11.5–15.5)
WBC: 7.3 10*3/uL (ref 4.0–10.5)

## 2010-06-17 ENCOUNTER — Other Ambulatory Visit: Payer: Self-pay | Admitting: Cardiology

## 2010-06-22 NOTE — Assessment & Plan Note (Signed)
[facility]                          [facility] NOTE   [name], [name]                    MRN:          [id number]  DATE:06/19/2007                            DOB:          05/02/1945    REFERRING PHYSICIAN:  Lia Hopping   HISTORY OF PRESENT ILLNESS:  The patient is a very pleasant 65 year old  female with a history of mixed ischemic and nonischemic cardiomyopathy  with improved ejection fraction to 55% status post ICD implant.  The  patient doing well.  She reports no chest pain, shortness of breath,  orthopnea, PND.  The patient previously described pain across the ICD  implant which has much improved.  Unfortunately, she has resumed tobacco  use and is requesting Chantix today.   MEDICATIONS:  Listed in the chart and include:  1. Plavix 75 mg p.o. daily.  2. Coreg 25 mg b.i.d.  3. Percocet.  4. OxyContin.  5. Lasix.  6. Lisinopril.  7. Fish oil.  8. _________.  9. Vitamin E.  10.Calcium.  11.Multivitamin.  12.Xanax.   PHYSICAL EXAMINATION:  VITAL SIGNS:  Blood pressure 122/73, heart rate  74, weight 160 pounds.  NECK:  Normal carotid upstrokes.  No carotid bruits.  LUNGS:  Clear.  HEART:  Regular rate and rhythm.  Normal S1 and S2.  ABDOMEN:  Soft.  EXTREMITIES:  No clubbing, cyanosis, or edema.   PROBLEM LIST:  1. Atypical chest pain, resolved.  2. History of vasovagal syncope.  3. Mixed ischemic and nonischemic cardiomyopathy with improved      ejection fraction.  4. Coronary artery disease with negative adenosine Cardiolite.  Last      study February of 2008.  5. Status post implantable cardioverter-defibrillator implant.  6. Dyslipidemia.  7. Chronic obstructive pulmonary disease.  8. Sleep apnea.  9. Hypertension.  10.Normal renal arteries by catheterization.  11.Chronic pain syndrome.   PLAN:  1. The patient is doing remarkably well.  She has no further pain      across the ICD implant.  2. I have  given the patient a prescription for Chantix.  3. No further cardiovascular testing is required at the present time.     Learta Codding, MD,FACC  Electronically Signed    GED/MedQ  DD: 06/19/2007  DT: 06/19/2007  Job #: [id number]   cc:   Lia Hopping
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
## 2010-06-22 NOTE — Cardiovascular Report (Signed)
[facility]                   [place] ELECTROPHYSIOLOGY DEVICE CLINIC NOTE   [name], [name]                    MRN:          [id number]  DATE:11/20/2006                            DOB:          1945-06-07    [name] was seen today in the [facility] for followup of  her Medtronic ICD.  She has a Marquis model [id number] that was implanted on  December 10, 2002, for ventricular tachycardia.   Interrogation of her device demonstrates,  The presence of a 69/49 lead which was stable.  Her R waves measured 7.2 millivolts with an RV impedance of 544 ohms and  a threshold of 1 volt at 0.2 milliseconds.  Her shock impedances were 47 and 60 ohms.  Her battery voltage was 2.96 volts with a charge time of 7.96 seconds.  She was in a normal sinus rhythm today.  She had 11 nonsustained episodes which ranged from 5-9 beats.  She had  not had any episodes that had been treated by her device.  She is programmed as a one zone VF device at 188 beats per minute.  She is currently V pacing less than 0.1% of the time.  I was unable to install the lead integrity alert on her device because  she has a Psychologist, sport and exercise and the older models do not accept the software.   An appointment was made for her today to return to the clinic in 3  months in [place].      Gypsy Balsam, RN,BSN  Electronically Signed      Doylene Canning. Ladona Ridgel, MD  Electronically Signed   AS/MedQ  DD: 11/20/2006  DT: 11/20/2006  Job #: [id number]
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
## 2010-06-25 NOTE — Discharge Summary (Signed)
[name], [name]                       ACCOUNT NO.:  [id number]   MEDICAL RECORD NO.:  [id number]                   PATIENT TYPE:  OIB   LOCATION:  6529                                 FACILITY:  [facility]   PHYSICIAN:  Doylene Canning. Ladona Ridgel, M.D.               DATE OF BIRTH:  1945-02-17   DATE OF ADMISSION:  12/10/2002  DATE OF DISCHARGE:  12/11/2002                                 DISCHARGE SUMMARY   PRIMARY DIAGNOSIS:  Ischemic cardiomyopathy with nonsustained ventricular  tachycardia.   SECONDARY DIAGNOSES:  1. Hyperlipidemia.  2. Bilateral renal insufficiency.  3. Class II congestive heart failure.  4. Positive tobacco abuse.   This is a 65 year old female who was initially admitted to the hospital  transferred from [facility] for evaluation of chest pain and congestive heart  failure.  During her hospitalization she had nonsustained ventricular  tachycardia.  Because of concerns of ischemic heart disease with her chest  pain and nonsustained VT in the setting of left ventricular dysfunction she  underwent a heart catheterization which demonstrated mildly- to moderately-  elevated pulmonary pressures, modest coronary disease - specifically, she  had a diffuse stenosis 70% stenosis and an 80% ostial diagonal stenosis; the  left circumflex artery was free of significant disease; right coronary had a  tubular 75% stenosis in the mid region with two-vessel coronary disease.  She was considered for operative therapy but her LV function was severely  impaired with an EF of 19%.  She underwent an adenosine thallium stress test  demonstrating inferior scar but no clear-cut areas of viability or ischemia.  She was on heart failure medications and returns for follow-up.  Overall she  feels better than she did prior hospitalization.  Heart failure presently is  a class I to II.  She denies syncope or near-syncope or palpitations.  The  patient underwent a P-wave alternans path per the  Masters trial and was  admitted for placement of a Medtronic-type defibrillator.  She underwent  placement of a Medtronic defibrillator on December 10, 2002, tolerated the  procedure well, had no immediate postoperative complications.  Chest x-ray  showed the lead to be in the RV with no pneumothorax.  Values were within  normal limits.  Threshold R wave was 10.5 volts, impendence 608 ohms, HBB  was 41 or HVX 54 ohms, threshold was 1.01 volts at 0.1 milliseconds.  She  was discharged on all of her previous medications:  1. Plavix 75 daily.  2. Coreg 6.25 b.i.d.  3. Altace 2.5 daily.  4.     Zocor 20 nightly.  5. Lipitor 10 daily.  6. All of her previous pain medications.      Chinita Pester, C.R.N.P. [facility]                 Doylene Canning. Ladona Ridgel, M.D.    DS/MEDQ  D:  12/11/2002  T:  12/12/2002  Job:  [id number]  cc:   [facility]   Evlyn Courier, M.D.  [place], [place]   [facility] [employer] in [place]

## 2010-06-25 NOTE — Assessment & Plan Note (Signed)
[facility]                          [place] CARDI[facility] OFFICE NOTE   [name], [name]                    MRN:          [id number]  DATE:01/30/2008                            DOB:          11/21/1945    PRIMARY CARDIOLOGIST:  Learta Codding, MD, [facility]   REASON FOR VISIT:  Scheduled followup.   HISTORY OF PRESENT ILLNESS:  [name] returns to the clinic for  scheduled followup, essentially reporting no significant change from her  baseline symptoms.  Unfortunately, she continues to smoke, but is  currently down to 2-3 cigarettes a day.  She states that the Chantix  that Dr. Andee Lineman prescribed for her earlier this year did help,  initially.  However, she was not able to tolerate the recommended higher  doses.  Consequently, she stopped taking the medication and resumed  smoking.  However, she does report that for the initial period that she  was on this medication, she did stop smoking altogether.   With respect to her chronic symptoms of exertional dyspnea, these are  essentially stable and unchanged from her previous visit.   The patient denies any firing of her defibrillator device.   CURRENT MEDICATIONS:  1. Plavix 75 mg daily.  2. Coreg 25 mg b.i.d.  3. OxyContin 40 mg t.i.d.  4. Lasix 20 mg b.i.d.  5. Lisinopril 40 mg daily.  6. Fish oil 1 g daily.  7. Seroquel 200 mg nightly.  8. Xanax 0.25 mg t.i.d.  9. Percocet p.r.n.  10.CPAP.   PHYSICAL EXAMINATION:  VITAL SIGNS:  Blood pressure 134/72, pulse 60 and  regular, and weight 155 (down 5 pounds).  GENERAL:  A 65 year old female, sitting upright, in no distress.  HEENT:  Normocephalic, atraumatic.  NECK:  Palpable carotid pulses without bruits.  No JVD.  LUNGS:  Clear to auscultation in all fields.  HEART:  Regular rate and rhythm.  No significant murmurs.  ABDOMEN:  Benign.  EXTREMITIES:  No significant edema.  NEUROLOGIC:  Mildly anxious, but no focal deficit.   IMPRESSION:  1. Mixed cardiomyopathy.      a.     Ischemic/nonischemic      b.     Improved LVF (EF 55%) by 2-D echo, January 2008.      c.     Previous EF of 19%.      d.     Status post Medtronic ICD implantation in 2004, for primary       prevention.      e.     Normal adenosine Cardiolite; EF 58%, November 2008.  2. Chronic pain syndrome.  3. COPD/ongoing tobacco.  4. Hypertension.  5. Dyslipidemia.      a.     Statin intolerance.  6. Sleep apnea.  7. ASPIRIN allergy.   PLAN:  1. Continue current medication regimen.  In addition, we have agreed      to comply with her request to provide her with a 1-week supply of      OxyContin at the current dose, given that her primary physician is      on vacation.  I told her that I will be willing to do this, but      with a limited supply consisting only of enough to last for the      next 7 days, without any refills.  She is to otherwise follow up      with Dr. Olena Leatherwood for continuation of this medication.  2. The patient has also expressed a desire in being rechallenged with      Chantix, albeit at the initial starting dose of 0.5 mg.  She was      not able to tolerate the advised higher doses secondary to      significant headache.  I will, therefore, prescribe for her this      low dose to be continued throughout the recommended 3 months.  3. Schedule return clinic follow up with myself and Dr. Andee Lineman in 6      months, or sooner if needed.  The patient is to otherwise follow up      with Dr. Sherryl Manges here in the [facility], as scheduled.      Gene Serpe, PA-C  Electronically Signed      Learta Codding, MD,FACC  Electronically Signed   GS/MedQ  DD: 01/30/2008  DT: 01/31/2008  Job #: [id number]   cc:   [name]

## 2010-06-25 NOTE — Assessment & Plan Note (Signed)
[facility]                         ELECTROPHYSIOLOGY OFFICE NOTE   LISANNE, PONCE                    MRN:          [id number]  DATE:04/21/2006                            DOB:          1945/09/11    Ms. Delafuente is a 65 year old woman with a history of a mixed  cardiomyopathy, previously implanted defibrillator for nonsustained  ventricular tachycardia in the setting of this myopathy with an ejection  fraction of 20%.  She has never had appropriate ICD therapy.  She does  have a 6949 lead implanted.  Her last evaluation of her ejection  fraction in January of 2008 demonstrated, by echo, 55%.  She denies  chest pain, shortness of breath.  She has stopped smoking.   MEDICATIONS:  1. Plavix 425 b.i.d.  2. Percocet and OxyContin for chronic pain.  3. Lasix 20 b.i.d.  4. Lisinopril 20.  5. Fish oil.  6. Ambien h.s.   EXAMINATION:  She is an elderly female appearing somewhat older than her  stated age of 8.  Her blood pressure was recorded but I cannot find it.  NECK VEINS:  Flat.  CAROTIDS:  Brisk and full bilaterally without bruits.  BACK:  Without kyphosis, scoliosis.  LUNGS:  Clear.  HEART SOUNDS:  Regular.  EXTREMITIES:  Without edema.   Interrogation of her Medtronic 7230 ICD demonstrates an R wave of 8.8  with an impedance of 560, a threshold of 1 volt at 0.4, battery voltage  3, there were no intercurrent therapies, there were 5 intercurrent  episodes.   IMPRESSION:  1. Nonsustained ventricular tachycardia.  2. Ischemic and nonischemic cardiomyopathy, previously with an      ejection fraction of 20%, now at 55.  3. Status post implantable cardioverter-defibrillator for the above.  4. Cigarette cessation - hooray.  5. Chronic narcotic use for her pain.   Ms. Waitman is stable from an arrhythmia point of view and a  cardiovascular point of view; she is trying to establish with primary  care.  I think it is very important, it  would be cognizant of her  narcotic use and I would defer filling these prescriptions to primary  care.     Duke Salvia, MD, [facility]  Electronically Signed    SCK/MedQ  DD: 04/21/2006  DT: 04/23/2006  Job #: [id number]
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
## 2010-06-25 NOTE — Op Note (Signed)
NAMEEMAAN, GARY                       ACCOUNT NO.:  [id number]   MEDICAL RECORD NO.:  [id number]                   PATIENT TYPE:  OIB   LOCATION:  6529                                 FACILITY:  [facility]   PHYSICIAN:  Doylene Canning. Ladona Ridgel, M.D.               DATE OF BIRTH:  1945/04/21   DATE OF PROCEDURE:  12/10/2002  DATE OF DISCHARGE:                                 OPERATIVE REPORT   PROCEDURE PERFORMED:  Insertion of a single chamber  defibrillator.   INDICATIONS FOR PROCEDURE:  Paroxysmal nonsustained ventricular tachycardia,  status post myocardial infarction with ischemic cardiomyopathy and an  ejection fraction of 19%.   INTRODUCTION:  The patient is a 65 year old woman with a history of  coronary artery disease, status post  myocardial infarction. She has severe  left ventricular dysfunction which has been sustained. She has had a  diaphragmatic MI with an ejection fraction of 19%. She is now referred for  ICD insertion as part of the master trial.   DESCRIPTION OF PROCEDURE:  After informed consent was obtained the patient  was taken to the diagnostic electrophysiology laboratory  in a fasting  state. After the usual prepping and draping, intravenous Fentanyl and  midazolam was given for sedation. A total of 30 mL of Lidocaine was  infiltrated into the left infraclavicular region.   A 10-cm incision was carried out over this region and electrocautery was  utilized to dissect down to the fascial plane. The left subclavian vein was  punctured, and the Medtronic model 6949-65 cm active fixation  defibrillation lead serial number [id number] was advanced into the right  ventricle. Mapping was carried out in the right ventricle and at the RV  apex. The R-waves through the analyzer measured 15 millivolts with a pacing  threshold of 0.5 volts at 0.5 milliseconds once the lead was actively fixed.  The pacing impedance was 833 ohms; 10 volt pacing did not stimulate the  diaphragm.   With the ventricular lead in satisfactory position, it was secured to the  subpectoralis fascia with a figure-of-8 silk suture. The sewing sleeve was  secured with silk suture. Electrocautery was then utilized to make a  subcutaneous pocket. Kanamycin irrigation was utilized to irrigate the  pocket and electrocautery was utilized to assure hemostasis.   The Medtronic [place] VR model [id number], serial number [id number] was connected  to the fibrillation lead and placed in the subcutaneous pocket. The  generator was secured with a silk suture. At this point  defibrillation  threshold testing was carried out.   After the patient was more deeply sedated with a combination of Fentanyl,  Valium and Versed,  defibrillation threshold testing was carried out. Of  note the patient required a total of 350 micrograms of Fentanyl, 30 mg of  Valium, and 22 mg of Versed to achieve adequate sedation for  defibrillation  testing. Ventricular fibrillation was induced with T-wave shock and  a 14  joule shock was delivered, terminating ventricular fibrillation, restoring  sinus rhythm.   Five minutes was allowed to elapse and a 2nd DFT test was carried out. Again  ventricular fibrillation was induced with a T-wave shock and a 14 joule  shock terminated ventricular fibrillation, restoring sinus rhythm. At this  point no additional  defibrillation threshold testing was carried out and  the incision was closed with a layer of 2-0 Vicryl followed  by a layer of 3-  0 Vicryl, followed  by a layer of 4-0 Vicryl.   Benzoin was painted on the skin, Steri-Strips were applied and a pressure  dressing  was placed. The patient returned to her room in satisfactory  condition.   COMPLICATIONS:  There were no immediate procedure complications.   RESULTS:  Successful  implantation of a Medtronic single chamber  defibrillator in a patient with an ischemic cardiomyopathy status post  myocardial infarction with  an ejection fraction of 19% and nonsustained  ventricular tachycardia.                                               Doylene Canning. Ladona Ridgel, M.D.    GWT/MEDQ  D:  12/10/2002  T:  12/10/2002  Job:  [id number]   cc:   Evlyn Courier, M.D.  [place], [place]   [facility]  [place], [place]   Kathrine Cords, R.N. [facility]
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
## 2010-06-25 NOTE — Assessment & Plan Note (Signed)
[facility]                          [facility] NOTE   [name], [name]                    MRN:          [id number]  DATE:03/20/2006                            DOB:          [birth year]    PRIMARY CARDIOLOGIST:  Dr. Andee Lineman.   PRIMARY ELECTROPHYSIOLOGIST:  Dr. Doylene Canning. Ladona Ridgel.   REASON FOR VISIT:  Post hospital followup.   [name] is a remarkable 65 year old female, with complex cardiac  history notable for mixed cardiomyopathy (EF previously 19%) and status  post Medtronic single chamber ICG (MASTER study) by Dr. Lewayne Bunting  November 2004, who was recently referred to [place] here at [facility] for further evaluation of chest pain.  Her symptoms were felt  to be quite atypical for ischemia and, in fact, serial cardiac markers  were all within normal limits.  Recommendation, thusly, was to proceed  with outpatient testing consisting of an adenosine Cardiolite.  However,  this has not yet been scheduled.   Additionally we repeated a 2D echocardiogram which showed marked  improvement with normalization of LV function with an EF of 55%.  The  right ventricle was also within normal limits.   Additional recommendations consisted of up titration of Coreg to b.i.d.  dosing regimen; however, this also was not fully executed.   From a symptomatic standpoint, patient remains stable and refers to  occasional episodes of chest pain which continue to remain quite  atypical.  Of note, however, the patient was, and remains, a somewhat  difficult historian.   Of note, the patient is in the process of establishing with her primary  care physician.   Also of note, while the patient was in the hospital we did request  interrogation of her defibrillator.  This was interpreted as revealing  SVT and sinus tachycardia, but no atrial fibrillation or ventricular  tachycardia/fibrillation.   CURRENT MEDICATIONS:  1. Plavix.  2. Coreg  25 daily.  3. OxyContin 40 t.i.d.  4. Percocet 5/325 q. 4 hours.  5. Lasix 20 b.i.d.  6. Lisinopril 20 daily,   PHYSICAL EXAMINATION:  Blood pressure 162/100, pulse 86 and regular,  weight 147.8.  GENERAL:  A 65 year old female sitting upright in no distress.  NECK:  Palpable bilateral carotid pulses without bruits; no JVD.  LUNGS:  Diminished breath sounds at bases, but without crackles or  wheezes.  HEART:  Regular rate and rhythm (S1, S2), no significant murmurs.  EXTREMITIES:  Palpable distal pulses without edema.  NEURO:  No focal deficit.   IMPRESSION:  1. Recurrent atypical chest pain.  2. Mixed cardiomyopathy.      a.     Ejection fraction 19% by cardiac catheterization September       2004; now improved to 55% by current 2D echo.      b.     Diffuse residual moderate disease by catheterization       September 2004.      c.     Status post single chamber Medtronic ICD, November 2004       (MASTER study) by Dr. Lewayne Bunting:  Interrogated during  recent       admission with no evidence of ventricular tachycardia/fibrillation       or atrial fibrillation.  3. Chronic obstructive pulmonary disease/history of tobacco.  4. Intermittent claudication.      a.     Bilateral femoral bruits.  5. ASPIRIN ALLERGY.      a.     On Plavix.  6. History of nonsustained ventricular tachycardia.  7. Hypertension.  8. History of hyperlipidemia.   PLAN:  1. Reschedule adenosine stress Cardiolite for risk stratification.  2. Up titrate Coreg to 25 b.i.d. as previously recommended.  3. Check fasting lipids/liver profile at time of stress testing for      reassessment of lipid status.  Further recommendations will be made      pending review of lipid levels.  4. Schedule lower extremity arterial Dopplers (ABIs) to exclude      significant peripheral vascular disease.  5. Resume followup with Dr. Graciela Husbands here in [place] for continued      monitoring of her defibrillator.  6. Schedule return  clinic, follow with myself and Dr. Andee Lineman in 1      month for review of stress test results and further      recommendations.      Gene Serpe, PA-C  Electronically Signed      Learta Codding, MD,FACC  Electronically Signed   GS/MedQ  DD: 03/20/2006  DT: 03/20/2006  Job #: [id number]

## 2010-06-25 NOTE — Cardiovascular Report (Signed)
[name], [name]                       ACCOUNT NO.:  [id number]   MEDICAL RECORD NO.:  [id number]                   PATIENT TYPE:  INP   LOCATION:  3709                                 FACILITY:  [facility]   PHYSICIAN:  Carole Binning, M.D. [facility]         DATE OF BIRTH:  [birth year]   DATE OF PROCEDURE:  10/29/2002  DATE OF DISCHARGE:                              CARDIAC CATHETERIZATION   PROCEDURE PERFORMED:  Right and left heart catheterization with coronary  angiography, left ventriculography, and abdominal aortography.   INDICATIONS:  [name] is a 65 year old woman who presented with  congestive heart failure.  An echocardiogram showed severely decreased left  ventricular systolic function with the ejection fraction of less than 25%.  She was referred for right and left heart catheterization to evaluate her  hemodynamics and rule out coronary artery disease.   PROCEDURAL NOTE:  An 8-French sheath was placed in the right femoral vein, 6-  French sheath in the right femoral artery.  Right heart catheterization was  performed with a Swan-Ganz catheter.  Left ventriculography and abdominal  aortography were performed with an angled pigtail catheter.  Coronary  arteriography was performed with a 6-French JL4 catheter and a 6-French no-  torque right catheter.  Contrast was Omnipaque.  There were no  complications.   RESULTS:  HEMODYNAMICS:  Right atrial mean pressure 20.  Right ventricular pressure 58/22.  Pulmonary  artery pressure 58/36.  Pulmonary capillary wedge mean pressure 32.  Left  ventricular pressure 126/32.  Aortic pressure 126/80.  There is no aortic  valve gradient.  Simultaneous measurements and pulmonary capillary wedge  pressure and left ventricular end-diastolic pressure revealed no gradient  across the mitral valve.   Cardiac output by the Fick method is 2.6, cardiac index 1.6.   LEFT VENTRICULOGRAM:  There is severe global hypokinesis of the left  ventricle with akinesis of  the anterior apical wall.  Ejection fraction calculated at 19%.   ABDOMINAL AORTOGRAM:  Normal renal arteries.  There is minimal atherosclerotic disease of the  abdominal aorta.  The iliac arteries are normal.   CORONARY ARTERIOGRAPHY:  (Right dominant)  Left main is normal.   Left anterior descending artery has a diffuse 70% stenosis extending to the  proximal mid vessel.  The LAD gives rise to a single very large first  diagonal branch.  There is an 80% stenosis at the ostium of this diagonal  branch.   Left circumflex gives rise to a small to normal sized first marginal, normal  sized second marginal, and small third marginal.  The left circumflex has  only mild luminal irregularities.   Right coronary artery has a tubular 75% stenosis in the mid vessel.  The  distal right coronary artery gives rise to a large posterior descending  artery and a large posterolateral branch.   IMPRESSION:  1. Elevated right and left heart filling pressures with significant     pulmonary  hypertension.  2. Severely decreased left ventricular systolic function which appears to be     out of proportion to the coronary artery disease present.  3. Two vessel coronary artery disease characterized by bifurcational disease     involving the left anterior descending artery and a large diagonal branch     as well as disease in the right coronary artery.   PLAN:  The patient will be referred for an Adenosine thallium scan to assess  for ischemia and viability.  Further recommendations for revascularization  will be based on results of this study.                                               Carole Binning, M.D. [facility]    MWP/MEDQ  D:  10/29/2002  T:  10/29/2002  Job:  [id number]   cc:   Evlyn Courier, M.D.  [place], [place]   [facility], [employer]
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
## 2010-06-25 NOTE — Assessment & Plan Note (Signed)
[facility]                          [facility]   [name], [name]                    MRN:          [id number]  DATE:04/25/2006                            DOB:          05-Nov-1945    Please send a copy of my note to Dr. Gerilyn [name] and also to Dr. Caron Presume.      Dorian Pod, ACNP       Learta Codding, MD,FACC    MB/MedQ  DD: 04/25/2006  DT: 04/25/2006  Job #: [id number]   cc:   [name], M.D.
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
## 2010-09-10 ENCOUNTER — Other Ambulatory Visit: Payer: Self-pay | Admitting: Cardiology

## 2010-09-17 ENCOUNTER — Other Ambulatory Visit: Payer: Self-pay | Admitting: Cardiology

## 2010-09-17 NOTE — Telephone Encounter (Signed)
[place] pt. 

## 2010-10-08 ENCOUNTER — Encounter: Payer: Self-pay | Admitting: *Deleted

## 2010-11-02 ENCOUNTER — Encounter: Payer: Self-pay | Admitting: *Deleted

## 2010-11-02 ENCOUNTER — Other Ambulatory Visit: Payer: Self-pay | Admitting: Cardiology

## 2010-11-04 ENCOUNTER — Ambulatory Visit (INDEPENDENT_AMBULATORY_CARE_PROVIDER_SITE_OTHER): Payer: Medicare Other | Admitting: Internal Medicine

## 2010-11-04 ENCOUNTER — Encounter: Payer: Self-pay | Admitting: Internal Medicine

## 2010-11-04 DIAGNOSIS — I429 Cardiomyopathy, unspecified: Secondary | ICD-10-CM

## 2010-11-04 DIAGNOSIS — I472 Ventricular tachycardia, unspecified: Secondary | ICD-10-CM

## 2010-11-04 DIAGNOSIS — F17200 Nicotine dependence, unspecified, uncomplicated: Secondary | ICD-10-CM

## 2010-11-04 DIAGNOSIS — I509 Heart failure, unspecified: Secondary | ICD-10-CM

## 2010-11-04 LAB — ICD DEVICE OBSERVATION
BATTERY VOLTAGE: 3.1696 V
BRDY-0002RV: 40 {beats}/min
CHARGE TIME: 9.379 s
DEV-0020ICD: NEGATIVE
FVT: 0
PACEART VT: 0
RV LEAD AMPLITUDE: 8 mv
RV LEAD IMPEDENCE ICD: 589 Ohm
RV LEAD THRESHOLD: 0.75 V
TOT-0001: 7
TOT-0002: 2
TOT-0006: 20101021000000
TZAT-0001FASTVT: 1
TZAT-0001SLOWVT: 1
TZAT-0001SLOWVT: 2
TZAT-0002FASTVT: NEGATIVE
TZAT-0004SLOWVT: 8
TZAT-0004SLOWVT: 8
TZAT-0005SLOWVT: 84 pct
TZAT-0005SLOWVT: 88 pct
TZAT-0011SLOWVT: 10 ms
TZAT-0011SLOWVT: 10 ms
TZAT-0012FASTVT: 200 ms
TZAT-0012SLOWVT: 200 ms
TZAT-0012SLOWVT: 200 ms
TZAT-0013SLOWVT: 3
TZAT-0013SLOWVT: 3
TZAT-0018FASTVT: NEGATIVE
TZAT-0018SLOWVT: NEGATIVE
TZAT-0018SLOWVT: NEGATIVE
TZAT-0019FASTVT: 8 V
TZAT-0019SLOWVT: 8 V
TZAT-0019SLOWVT: 8 V
TZAT-0020FASTVT: 1.5 ms
TZAT-0020SLOWVT: 1.5 ms
TZAT-0020SLOWVT: 1.5 ms
TZON-0003SLOWVT: 330 ms
TZON-0003VSLOWVT: 320 ms
TZON-0004SLOWVT: 32
TZON-0004VSLOWVT: 40
TZON-0005SLOWVT: 12
TZST-0001FASTVT: 2
TZST-0001FASTVT: 3
TZST-0001FASTVT: 4
TZST-0001FASTVT: 5
TZST-0001FASTVT: 6
TZST-0001SLOWVT: 3
TZST-0001SLOWVT: 4
TZST-0001SLOWVT: 5
TZST-0001SLOWVT: 6
TZST-0002FASTVT: NEGATIVE
TZST-0002FASTVT: NEGATIVE
TZST-0002FASTVT: NEGATIVE
TZST-0002FASTVT: NEGATIVE
TZST-0002FASTVT: NEGATIVE
TZST-0003SLOWVT: 25 J
TZST-0003SLOWVT: 35 J
TZST-0003SLOWVT: 35 J
TZST-0003SLOWVT: 35 J
VENTRICULAR PACING ICD: 0.01 pct
VF: 0

## 2010-11-04 NOTE — Assessment & Plan Note (Signed)
No recent VT Prior ICD shocks for T wave oversensing have been resolved with sensitivity reprogramming  Normal ICD function See Pace Art report No changes today  I have strongly encouraged home monitoring of her device, but she declines.   She will return in 3 months
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
## 2010-11-04 NOTE — Assessment & Plan Note (Signed)
Complete cessation encouraged. 

## 2010-11-30 ENCOUNTER — Other Ambulatory Visit: Payer: Self-pay | Admitting: Cardiology

## 2010-12-21 ENCOUNTER — Encounter: Payer: Medicare Other | Admitting: *Deleted

## 2010-12-22 ENCOUNTER — Encounter: Payer: Self-pay | Admitting: *Deleted

## 2011-01-05 ENCOUNTER — Ambulatory Visit: Payer: Medicare Other | Admitting: Cardiology

## 2011-02-23 ENCOUNTER — Ambulatory Visit: Payer: Medicare Other | Admitting: Cardiology

## 2011-02-24 ENCOUNTER — Encounter: Payer: Self-pay | Admitting: Cardiology

## 2011-02-28 DIAGNOSIS — R079 Chest pain, unspecified: Secondary | ICD-10-CM

## 2011-02-28 DIAGNOSIS — R55 Syncope and collapse: Secondary | ICD-10-CM

## 2011-03-02 ENCOUNTER — Other Ambulatory Visit: Payer: Self-pay | Admitting: Cardiovascular Disease

## 2011-03-02 DIAGNOSIS — R072 Precordial pain: Secondary | ICD-10-CM

## 2011-03-07 ENCOUNTER — Encounter: Payer: Self-pay | Admitting: *Deleted

## 2011-03-09 ENCOUNTER — Telehealth: Payer: Self-pay | Admitting: *Deleted

## 2011-03-09 DIAGNOSIS — R55 Syncope and collapse: Secondary | ICD-10-CM

## 2011-03-09 NOTE — Telephone Encounter (Signed)
Patient states that Dr. Andee Lineman told her to see Dr. Sandria Manly (neurology) for her black out spells when she was in the hospital.  Stated she called to do this, but they need referral from [place].  Explained to her that I reviewed consult note & d/c summary from [facility] and did not see this mentioned anywhere.  Will forward to MD.  Also, advised her to go to ED for worsening symptoms or syncope.  She verbalized understanding.

## 2011-03-13 NOTE — Telephone Encounter (Signed)
Yes I did recommend that - we can give her referral.

## 2011-03-15 NOTE — Telephone Encounter (Signed)
Left message to return call 

## 2011-03-17 NOTE — Telephone Encounter (Signed)
Referral faxed

## 2011-03-21 ENCOUNTER — Encounter: Payer: Medicare Other | Admitting: Cardiology

## 2011-04-26 ENCOUNTER — Encounter: Payer: Self-pay | Admitting: Internal Medicine

## 2011-04-26 ENCOUNTER — Telehealth: Payer: Self-pay | Admitting: Internal Medicine

## 2011-04-26 NOTE — Telephone Encounter (Signed)
04-26-11 sent past due letter, pt needs device ck with clinic/mt

## 2011-05-21 ENCOUNTER — Other Ambulatory Visit: Payer: Self-pay | Admitting: Cardiology

## 2011-07-21 ENCOUNTER — Ambulatory Visit (INDEPENDENT_AMBULATORY_CARE_PROVIDER_SITE_OTHER): Payer: Medicare Other | Admitting: *Deleted

## 2011-07-21 ENCOUNTER — Encounter: Payer: Self-pay | Admitting: Internal Medicine

## 2011-07-21 DIAGNOSIS — I429 Cardiomyopathy, unspecified: Secondary | ICD-10-CM

## 2011-07-21 DIAGNOSIS — I509 Heart failure, unspecified: Secondary | ICD-10-CM

## 2011-07-21 LAB — ICD DEVICE OBSERVATION
BATTERY VOLTAGE: 3.1287 V
BRDY-0002RV: 40 {beats}/min
CHARGE TIME: 9.779 s
DEV-0020ICD: NEGATIVE
FVT: 0
PACEART VT: 0
RV LEAD AMPLITUDE: 8 mv
RV LEAD IMPEDENCE ICD: 551 Ohm
RV LEAD THRESHOLD: 0.75 V
TOT-0001: 7
TOT-0002: 2
TOT-0006: 20101021000000
TZAT-0001FASTVT: 1
TZAT-0001SLOWVT: 1
TZAT-0001SLOWVT: 2
TZAT-0002FASTVT: NEGATIVE
TZAT-0004SLOWVT: 8
TZAT-0004SLOWVT: 8
TZAT-0005SLOWVT: 84 pct
TZAT-0005SLOWVT: 88 pct
TZAT-0011SLOWVT: 10 ms
TZAT-0011SLOWVT: 10 ms
TZAT-0012FASTVT: 200 ms
TZAT-0012SLOWVT: 200 ms
TZAT-0012SLOWVT: 200 ms
TZAT-0013SLOWVT: 3
TZAT-0013SLOWVT: 3
TZAT-0018FASTVT: NEGATIVE
TZAT-0018SLOWVT: NEGATIVE
TZAT-0018SLOWVT: NEGATIVE
TZAT-0019FASTVT: 8 V
TZAT-0019SLOWVT: 8 V
TZAT-0019SLOWVT: 8 V
TZAT-0020FASTVT: 1.5 ms
TZAT-0020SLOWVT: 1.5 ms
TZAT-0020SLOWVT: 1.5 ms
TZON-0003SLOWVT: 330 ms
TZON-0003VSLOWVT: 320 ms
TZON-0004SLOWVT: 32
TZON-0004VSLOWVT: 40
TZON-0005SLOWVT: 12
TZST-0001FASTVT: 2
TZST-0001FASTVT: 3
TZST-0001FASTVT: 4
TZST-0001FASTVT: 5
TZST-0001FASTVT: 6
TZST-0001SLOWVT: 3
TZST-0001SLOWVT: 4
TZST-0001SLOWVT: 5
TZST-0001SLOWVT: 6
TZST-0002FASTVT: NEGATIVE
TZST-0002FASTVT: NEGATIVE
TZST-0002FASTVT: NEGATIVE
TZST-0002FASTVT: NEGATIVE
TZST-0002FASTVT: NEGATIVE
TZST-0003SLOWVT: 25 J
TZST-0003SLOWVT: 35 J
TZST-0003SLOWVT: 35 J
TZST-0003SLOWVT: 35 J
VENTRICULAR PACING ICD: 0 pct
VF: 0

## 2011-07-21 NOTE — Progress Notes (Signed)
defib check in clinic  

## 2011-08-10 ENCOUNTER — Ambulatory Visit: Payer: Medicare Other | Admitting: Cardiology

## 2011-10-04 ENCOUNTER — Ambulatory Visit: Payer: Medicare Other | Admitting: Cardiology

## 2011-10-27 ENCOUNTER — Encounter: Payer: Self-pay | Admitting: *Deleted

## 2011-10-27 ENCOUNTER — Ambulatory Visit (INDEPENDENT_AMBULATORY_CARE_PROVIDER_SITE_OTHER): Payer: Medicare Other | Admitting: Physician Assistant

## 2011-10-27 ENCOUNTER — Encounter: Payer: Self-pay | Admitting: Physician Assistant

## 2011-10-27 ENCOUNTER — Other Ambulatory Visit: Payer: Self-pay | Admitting: Physician Assistant

## 2011-10-27 VITALS — BP 150/80 | HR 97 | Ht 61.0 in | Wt 160.7 lb

## 2011-10-27 DIAGNOSIS — R079 Chest pain, unspecified: Secondary | ICD-10-CM

## 2011-10-27 DIAGNOSIS — I10 Essential (primary) hypertension: Secondary | ICD-10-CM

## 2011-10-27 DIAGNOSIS — R5381 Other malaise: Secondary | ICD-10-CM

## 2011-10-27 DIAGNOSIS — E782 Mixed hyperlipidemia: Secondary | ICD-10-CM

## 2011-10-27 DIAGNOSIS — R0602 Shortness of breath: Secondary | ICD-10-CM

## 2011-10-27 DIAGNOSIS — R5383 Other fatigue: Secondary | ICD-10-CM

## 2011-10-27 DIAGNOSIS — I739 Peripheral vascular disease, unspecified: Secondary | ICD-10-CM

## 2011-10-27 DIAGNOSIS — I2510 Atherosclerotic heart disease of native coronary artery without angina pectoris: Secondary | ICD-10-CM

## 2011-10-27 MED ORDER — LISINOPRIL 2.5 MG PO TABS: 2.5000 mg | ORAL_TABLET | Freq: Every day | ORAL | Status: DC

## 2011-10-27 MED ORDER — NITROGLYCERIN 0.4 MG SL SUBL: 0.4000 mg | SUBLINGUAL_TABLET | SUBLINGUAL | Status: DC | PRN

## 2011-10-27 NOTE — Assessment & Plan Note (Signed)
Will renew prescription for lisinopril at current dose, and reemphasize importance of strict compliance with all her medications.

## 2011-10-27 NOTE — Assessment & Plan Note (Signed)
We'll reassess LVF with 2-D echocardiogram.
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
## 2011-10-27 NOTE — Assessment & Plan Note (Signed)
Will reassess lipid status with a FLP

## 2011-10-27 NOTE — Patient Instructions (Addendum)
   Schedule follow up with Dr. Johney Frame  Lisinopril - refill sent to pharmacy  Nitroglycerin - as needed for severe chest pain   Labs:  CMET, CBC, BNP, TSH, FLP  Echo   Dobutamine Stress Myoview  Lower Extremity Arterial Doppler + ABI  Office will contact with results Follow up in  2 weeks

## 2011-10-27 NOTE — Assessment & Plan Note (Signed)
We'll order lower extremity segmental arterial Dopplers, to rule out significant PAD. 

## 2011-10-27 NOTE — Assessment & Plan Note (Signed)
Resume followup with Dr. Hillis Range here in our device clinic, as previously scheduled.

## 2011-10-27 NOTE — Assessment & Plan Note (Addendum)
We'll rule out CAD progression with a dobutamine stress Myoview study. If this is suggestive of ischemia, then I would recommend more aggressive evaluation with a cardiac catheterization. Patient presents with several cardiac risk factors, including ongoing tobacco smoking, in the context of previously documented moderate CAD , back in 2004.

## 2011-10-31 ENCOUNTER — Telehealth: Payer: Self-pay

## 2011-10-31 NOTE — Telephone Encounter (Signed)
Dobutamine Stress Myoview Scheduled for 11-01-2011 [facility]

## 2011-10-31 NOTE — Telephone Encounter (Signed)
Pt has Medicare and (out of [place]  No precert required.

## 2011-11-02 ENCOUNTER — Other Ambulatory Visit: Payer: Self-pay

## 2011-11-02 ENCOUNTER — Other Ambulatory Visit (INDEPENDENT_AMBULATORY_CARE_PROVIDER_SITE_OTHER): Payer: Medicare Other

## 2011-11-02 DIAGNOSIS — R0602 Shortness of breath: Secondary | ICD-10-CM

## 2011-11-02 DIAGNOSIS — I2510 Atherosclerotic heart disease of native coronary artery without angina pectoris: Secondary | ICD-10-CM

## 2011-11-02 DIAGNOSIS — R079 Chest pain, unspecified: Secondary | ICD-10-CM

## 2011-11-03 ENCOUNTER — Encounter (INDEPENDENT_AMBULATORY_CARE_PROVIDER_SITE_OTHER): Payer: Medicare Other

## 2011-11-03 ENCOUNTER — Other Ambulatory Visit: Payer: Self-pay | Admitting: *Deleted

## 2011-11-03 DIAGNOSIS — I739 Peripheral vascular disease, unspecified: Secondary | ICD-10-CM

## 2011-11-03 MED ORDER — CLOPIDOGREL BISULFATE 75 MG PO TABS: 75.0000 mg | ORAL_TABLET | Freq: Every day | ORAL | Status: DC

## 2011-11-03 MED ORDER — CARVEDILOL 12.5 MG PO TABS: 12.5000 mg | ORAL_TABLET | Freq: Two times a day (BID) | ORAL | Status: DC

## 2011-11-11 ENCOUNTER — Ambulatory Visit (INDEPENDENT_AMBULATORY_CARE_PROVIDER_SITE_OTHER): Payer: Medicare Other | Admitting: Physician Assistant

## 2011-11-11 ENCOUNTER — Encounter: Payer: Self-pay | Admitting: Physician Assistant

## 2011-11-11 VITALS — BP 150/97 | HR 96 | Ht 61.0 in | Wt 157.8 lb

## 2011-11-11 DIAGNOSIS — E785 Hyperlipidemia, unspecified: Secondary | ICD-10-CM

## 2011-11-11 DIAGNOSIS — R7989 Other specified abnormal findings of blood chemistry: Secondary | ICD-10-CM

## 2011-11-11 DIAGNOSIS — R946 Abnormal results of thyroid function studies: Secondary | ICD-10-CM

## 2011-11-11 DIAGNOSIS — Z79899 Other long term (current) drug therapy: Secondary | ICD-10-CM

## 2011-11-11 MED ORDER — ATORVASTATIN CALCIUM 20 MG PO TABS: 20.0000 mg | ORAL_TABLET | Freq: Every evening | ORAL | Status: DC

## 2011-11-11 MED ORDER — CARVEDILOL 12.5 MG PO TABS: 18.7500 mg | ORAL_TABLET | Freq: Two times a day (BID) | ORAL | Status: DC

## 2011-11-11 NOTE — Assessment & Plan Note (Signed)
Will increase carvedilol to 18.75 twice a day for more aggressive management.

## 2011-11-11 NOTE — Assessment & Plan Note (Signed)
Will reschedule a dobutamine stress Myoview for next week. As noted before, if this is suggestive of ischemia, then I would recommend more aggressive evaluation with a cardiac catheterization. If, however, the study is negative, then we will have patient  resume clinic followup with me in 3 months.

## 2011-11-11 NOTE — Assessment & Plan Note (Signed)
We'll initiate statin therapy with Lipitor 20 daily. FLP/LFT profile in 12 weeks. Aggressive management recommended with target LDL 70 or less, if feasible.

## 2011-11-11 NOTE — Assessment & Plan Note (Signed)
Recent TSH 0.32. Will proceed with a free T3/T4 level to rule out hyperthyroidism.

## 2011-11-11 NOTE — Assessment & Plan Note (Signed)
No further workup indicated. Recent LE arterial Dopplers revealed normal ABIs.
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
## 2011-11-11 NOTE — Patient Instructions (Addendum)
   Reschedule Dobutamine Stress Myoview - remember to hold Coreg x 24 hours prior to test.    Increase Coreg to 18.75mg  twice a day    Begin Lipitor 20mg  every evening   Labs:  Due around February 11, 2012 - FLP / LFT  Labs: today - free T4, free T3  Office will contact with results Follow up in  3 months

## 2011-11-15 DIAGNOSIS — R079 Chest pain, unspecified: Secondary | ICD-10-CM

## 2011-11-17 ENCOUNTER — Encounter: Payer: Self-pay | Admitting: *Deleted

## 2011-11-25 ENCOUNTER — Other Ambulatory Visit: Payer: Self-pay | Admitting: *Deleted

## 2011-11-25 MED ORDER — FUROSEMIDE 20 MG PO TABS: 20.0000 mg | ORAL_TABLET | Freq: Every day | ORAL | Status: DC

## 2011-12-06 ENCOUNTER — Encounter: Payer: Self-pay | Admitting: *Deleted

## 2011-12-12 ENCOUNTER — Ambulatory Visit (INDEPENDENT_AMBULATORY_CARE_PROVIDER_SITE_OTHER): Payer: Medicare Other | Admitting: Internal Medicine

## 2011-12-12 ENCOUNTER — Encounter: Payer: Self-pay | Admitting: Internal Medicine

## 2011-12-12 VITALS — BP 128/79 | HR 101 | Ht 61.0 in | Wt 158.0 lb

## 2011-12-12 DIAGNOSIS — I2510 Atherosclerotic heart disease of native coronary artery without angina pectoris: Secondary | ICD-10-CM

## 2011-12-12 DIAGNOSIS — I429 Cardiomyopathy, unspecified: Secondary | ICD-10-CM

## 2011-12-12 DIAGNOSIS — I472 Ventricular tachycardia: Secondary | ICD-10-CM

## 2011-12-12 DIAGNOSIS — Z95810 Presence of automatic (implantable) cardiac defibrillator: Secondary | ICD-10-CM

## 2011-12-12 LAB — ICD DEVICE OBSERVATION
FVT: 0
RV LEAD IMPEDENCE ICD: 589 Ohm
RV LEAD THRESHOLD: 0.75 V
TZAT-0001FASTVT: 1
TZAT-0001SLOWVT: 1
TZAT-0001SLOWVT: 2
TZAT-0002FASTVT: NEGATIVE
TZAT-0004SLOWVT: 8
TZAT-0004SLOWVT: 8
TZAT-0012FASTVT: 200 ms
TZAT-0013SLOWVT: 3
TZAT-0013SLOWVT: 3
TZAT-0018FASTVT: NEGATIVE
TZAT-0020SLOWVT: 1.5 ms
TZAT-0020SLOWVT: 1.5 ms
TZON-0004SLOWVT: 32
TZST-0001FASTVT: 4
TZST-0001FASTVT: 5
TZST-0001FASTVT: 6
TZST-0001SLOWVT: 3
TZST-0001SLOWVT: 5
TZST-0002FASTVT: NEGATIVE
TZST-0002FASTVT: NEGATIVE
TZST-0003SLOWVT: 35 J
TZST-0003SLOWVT: 35 J
VF: 0

## 2011-12-12 MED ORDER — CLOPIDOGREL BISULFATE 75 MG PO TABS: 75.0000 mg | ORAL_TABLET | Freq: Every day | ORAL | Status: DC

## 2011-12-12 NOTE — Assessment & Plan Note (Signed)
Normal ICD function See Arita Miss Art report No changes today  Nonsustained VT observed, no sustained arrhythmias Normal MDT 6949 lead function

## 2011-12-12 NOTE — Patient Instructions (Addendum)
   Plavix refill sent to [facility] Continue all current medications. 3 months - device clinic with Kristen 1 year - Dr. Johney Frame

## 2011-12-12 NOTE — Assessment & Plan Note (Signed)
Recent dobutamine stress test was low risk No further workup planned
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
## 2012-02-17 ENCOUNTER — Encounter: Payer: Self-pay | Admitting: Internal Medicine

## 2012-07-27 ENCOUNTER — Encounter: Payer: Self-pay | Admitting: Internal Medicine

## 2012-07-27 ENCOUNTER — Ambulatory Visit (INDEPENDENT_AMBULATORY_CARE_PROVIDER_SITE_OTHER): Payer: Medicare Other | Admitting: *Deleted

## 2012-07-27 DIAGNOSIS — I472 Ventricular tachycardia: Secondary | ICD-10-CM

## 2012-07-27 LAB — ICD DEVICE OBSERVATION
DEV-0020ICD: NEGATIVE
FVT: 0
PACEART VT: 0
TOT-0001: 7
TOT-0002: 2
TZAT-0001SLOWVT: 1
TZAT-0001SLOWVT: 2
TZAT-0002FASTVT: NEGATIVE
TZAT-0004SLOWVT: 8
TZAT-0004SLOWVT: 8
TZAT-0005SLOWVT: 84 pct
TZAT-0005SLOWVT: 88 pct
TZAT-0018FASTVT: NEGATIVE
TZAT-0019FASTVT: 8 V
TZAT-0020FASTVT: 1.5 ms
TZON-0004SLOWVT: 32
TZON-0004VSLOWVT: 40
TZST-0001FASTVT: 5
TZST-0001FASTVT: 6
TZST-0001SLOWVT: 3
TZST-0001SLOWVT: 6
TZST-0002FASTVT: NEGATIVE
TZST-0002FASTVT: NEGATIVE
TZST-0002FASTVT: NEGATIVE
TZST-0003SLOWVT: 35 J
VENTRICULAR PACING ICD: 0.01 pct

## 2012-07-27 NOTE — Progress Notes (Signed)
ICD check in office. 

## 2012-09-15 ENCOUNTER — Encounter: Payer: Self-pay | Admitting: Physician Assistant

## 2012-09-15 DIAGNOSIS — R079 Chest pain, unspecified: Secondary | ICD-10-CM

## 2012-09-21 ENCOUNTER — Other Ambulatory Visit: Payer: Self-pay | Admitting: Physician Assistant

## 2012-10-07 ENCOUNTER — Other Ambulatory Visit: Payer: Self-pay | Admitting: Internal Medicine

## 2012-10-23 ENCOUNTER — Telehealth: Payer: Self-pay | Admitting: *Deleted

## 2012-10-23 NOTE — Telephone Encounter (Signed)
Mrs. Sampedro needs a note written stating what her heart condition is. She needs this paper so that she can try and get Some type of financial help.  Please call her # [phone]

## 2012-10-26 NOTE — Telephone Encounter (Signed)
Returned call - pt stated that she has this taken care of.

## 2012-11-02 ENCOUNTER — Ambulatory Visit (INDEPENDENT_AMBULATORY_CARE_PROVIDER_SITE_OTHER): Payer: Medicare Other | Admitting: *Deleted

## 2012-11-02 DIAGNOSIS — I472 Ventricular tachycardia: Secondary | ICD-10-CM

## 2012-11-02 DIAGNOSIS — I429 Cardiomyopathy, unspecified: Secondary | ICD-10-CM

## 2012-11-02 LAB — ICD DEVICE OBSERVATION
BRDY-0002RV: 40 {beats}/min
CHARGE TIME: 10.2 s
DEV-0020ICD: NEGATIVE
RV LEAD AMPLITUDE: 6.4 mv
TZAT-0001SLOWVT: 1
TZAT-0001SLOWVT: 2
TZAT-0002FASTVT: NEGATIVE
TZAT-0012FASTVT: 200 ms
TZAT-0018SLOWVT: NEGATIVE
TZAT-0018SLOWVT: NEGATIVE
TZAT-0019SLOWVT: 8 V
TZAT-0019SLOWVT: 8 V
TZAT-0020FASTVT: 1.5 ms
TZAT-0020SLOWVT: 1.5 ms
TZAT-0020SLOWVT: 1.5 ms
TZON-0003VSLOWVT: 320 ms
TZST-0001FASTVT: 5
TZST-0001SLOWVT: 5
TZST-0002FASTVT: NEGATIVE
TZST-0002FASTVT: NEGATIVE
TZST-0002FASTVT: NEGATIVE
TZST-0003SLOWVT: 25 J
TZST-0003SLOWVT: 35 J
TZST-0003SLOWVT: 35 J

## 2012-11-02 NOTE — Progress Notes (Signed)
icd check in clinic. Battery voltate 3.10 V .[id number] lead stable at this time. SIC 0. Normal device function. 3 nst episodes recorded. No changes made. ROV in 3 mths w/JA in [place].

## 2012-11-10 ENCOUNTER — Encounter: Payer: Self-pay | Admitting: Internal Medicine

## 2012-11-23 ENCOUNTER — Other Ambulatory Visit: Payer: Self-pay | Admitting: Physician Assistant

## 2012-12-06 ENCOUNTER — Ambulatory Visit: Payer: Medicare Other | Admitting: Cardiovascular Disease

## 2013-01-08 ENCOUNTER — Ambulatory Visit: Payer: Medicare Other | Admitting: Cardiovascular Disease

## 2013-01-08 ENCOUNTER — Encounter: Payer: Self-pay | Admitting: Cardiovascular Disease

## 2013-01-17 ENCOUNTER — Encounter: Payer: Medicare Other | Admitting: Internal Medicine

## 2013-01-17 ENCOUNTER — Encounter: Payer: Self-pay | Admitting: Internal Medicine

## 2013-03-07 ENCOUNTER — Other Ambulatory Visit: Payer: Self-pay | Admitting: Physician Assistant

## 2013-05-15 ENCOUNTER — Encounter: Payer: Self-pay | Admitting: Cardiovascular Disease

## 2013-05-15 ENCOUNTER — Ambulatory Visit (INDEPENDENT_AMBULATORY_CARE_PROVIDER_SITE_OTHER): Payer: Medicare Other | Admitting: Cardiovascular Disease

## 2013-05-15 ENCOUNTER — Other Ambulatory Visit: Payer: Self-pay | Admitting: *Deleted

## 2013-05-15 VITALS — BP 154/93 | HR 92 | Ht 61.5 in | Wt 167.0 lb

## 2013-05-15 DIAGNOSIS — Z95810 Presence of automatic (implantable) cardiac defibrillator: Secondary | ICD-10-CM | POA: Diagnosis not present

## 2013-05-15 DIAGNOSIS — I472 Ventricular tachycardia: Secondary | ICD-10-CM | POA: Diagnosis not present

## 2013-05-15 DIAGNOSIS — I2510 Atherosclerotic heart disease of native coronary artery without angina pectoris: Secondary | ICD-10-CM | POA: Diagnosis not present

## 2013-05-15 DIAGNOSIS — E785 Hyperlipidemia, unspecified: Secondary | ICD-10-CM

## 2013-05-15 DIAGNOSIS — I4729 Other ventricular tachycardia: Secondary | ICD-10-CM

## 2013-05-15 DIAGNOSIS — I10 Essential (primary) hypertension: Secondary | ICD-10-CM

## 2013-05-15 MED ORDER — LISINOPRIL 5 MG PO TABS: 5.0000 mg | ORAL_TABLET | Freq: Every day | ORAL | Status: DC

## 2013-05-15 MED ORDER — CLOPIDOGREL BISULFATE 75 MG PO TABS: 75.0000 mg | ORAL_TABLET | Freq: Every day | ORAL | Status: DC

## 2013-05-15 MED ORDER — FUROSEMIDE 20 MG PO TABS: 20.0000 mg | ORAL_TABLET | Freq: Every day | ORAL | Status: DC

## 2013-05-15 NOTE — Patient Instructions (Signed)
   Increase Lisinopril to 5mg  daily - may take 2 tabs of your 2.5mg  tablet daily till finish current supply - new sent to pharm Continue all other medications.   Labs for FLP / LFT Office will contact with results via phone or letter.   Your physician wants you to follow up in: 6 months.  You will receive a reminder letter in the mail one-two months in advance.  If you don't receive a letter, please call our office to schedule the follow up appointment
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
## 2013-07-19 ENCOUNTER — Ambulatory Visit (INDEPENDENT_AMBULATORY_CARE_PROVIDER_SITE_OTHER): Payer: Medicare Other | Admitting: *Deleted

## 2013-07-19 ENCOUNTER — Encounter: Payer: Self-pay | Admitting: Internal Medicine

## 2013-07-19 DIAGNOSIS — I472 Ventricular tachycardia: Secondary | ICD-10-CM

## 2013-07-19 DIAGNOSIS — I429 Cardiomyopathy, unspecified: Secondary | ICD-10-CM

## 2013-07-19 DIAGNOSIS — I4729 Other ventricular tachycardia: Secondary | ICD-10-CM

## 2013-07-19 LAB — MDC_IDC_ENUM_SESS_TYPE_INCLINIC
Battery Voltage: 3.05 V
Brady Statistic RV Percent Paced: 0.04 %
Date Time Interrogation Session: 20150612134041
HIGH POWER IMPEDANCE MEASURED VALUE: 47 Ohm
HighPow Impedance: 66 Ohm
Lead Channel Setting Pacing Pulse Width: 0.4 ms
Lead Channel Setting Sensing Sensitivity: 0.6 mV
MDC IDC MSMT LEADCHNL RV IMPEDANCE VALUE: 551 Ohm
MDC IDC MSMT LEADCHNL RV PACING THRESHOLD AMPLITUDE: 0.75 V
MDC IDC MSMT LEADCHNL RV PACING THRESHOLD PULSEWIDTH: 0.4 ms
MDC IDC MSMT LEADCHNL RV SENSING INTR AMPL: 7.75 mV
MDC IDC SET LEADCHNL RV PACING AMPLITUDE: 2.5 V
MDC IDC SET ZONE DETECTION INTERVAL: 270 ms
Zone Setting Detection Interval: 320 ms
Zone Setting Detection Interval: 330 ms

## 2013-07-19 NOTE — Progress Notes (Signed)
ICD check in clinic. Battery voltage 3.05 V. 6949 lead stable at this time. SIC 0 . Normal device function. 9 nst episodes. ROV 08-22-13 @ 1215 with JA/[place].

## 2013-08-22 ENCOUNTER — Encounter: Payer: Medicare Other | Admitting: Internal Medicine

## 2013-08-27 ENCOUNTER — Encounter: Payer: Medicare Other | Admitting: Internal Medicine

## 2013-09-02 ENCOUNTER — Emergency Department (HOSPITAL_COMMUNITY): Payer: Medicare Other

## 2013-09-02 ENCOUNTER — Encounter (HOSPITAL_COMMUNITY): Payer: Self-pay | Admitting: Emergency Medicine

## 2013-09-02 ENCOUNTER — Inpatient Hospital Stay (HOSPITAL_COMMUNITY)
Admission: EM | Admit: 2013-09-02 | Discharge: 2013-09-07 | DRG: 392 | Disposition: A | Discharge status: Home / Self Care | Payer: Medicare Other | Attending: Internal Medicine | Admitting: Internal Medicine

## 2013-09-02 DIAGNOSIS — Z823 Family history of stroke: Secondary | ICD-10-CM

## 2013-09-02 DIAGNOSIS — E860 Dehydration: Secondary | ICD-10-CM | POA: Diagnosis present

## 2013-09-02 DIAGNOSIS — I2510 Atherosclerotic heart disease of native coronary artery without angina pectoris: Secondary | ICD-10-CM | POA: Diagnosis present

## 2013-09-02 DIAGNOSIS — I4729 Other ventricular tachycardia: Secondary | ICD-10-CM | POA: Diagnosis present

## 2013-09-02 DIAGNOSIS — I959 Hypotension, unspecified: Secondary | ICD-10-CM | POA: Diagnosis present

## 2013-09-02 DIAGNOSIS — R079 Chest pain, unspecified: Secondary | ICD-10-CM | POA: Diagnosis not present

## 2013-09-02 DIAGNOSIS — R072 Precordial pain: Secondary | ICD-10-CM

## 2013-09-02 DIAGNOSIS — R110 Nausea: Secondary | ICD-10-CM

## 2013-09-02 DIAGNOSIS — K219 Gastro-esophageal reflux disease without esophagitis: Secondary | ICD-10-CM | POA: Diagnosis present

## 2013-09-02 DIAGNOSIS — I472 Ventricular tachycardia, unspecified: Secondary | ICD-10-CM

## 2013-09-02 DIAGNOSIS — F259 Schizoaffective disorder, unspecified: Secondary | ICD-10-CM | POA: Diagnosis present

## 2013-09-02 DIAGNOSIS — I10 Essential (primary) hypertension: Secondary | ICD-10-CM

## 2013-09-02 DIAGNOSIS — Z800 Family history of malignant neoplasm of digestive organs: Secondary | ICD-10-CM

## 2013-09-02 DIAGNOSIS — G894 Chronic pain syndrome: Secondary | ICD-10-CM | POA: Diagnosis present

## 2013-09-02 DIAGNOSIS — F17200 Nicotine dependence, unspecified, uncomplicated: Secondary | ICD-10-CM

## 2013-09-02 DIAGNOSIS — I252 Old myocardial infarction: Secondary | ICD-10-CM

## 2013-09-02 DIAGNOSIS — K3184 Gastroparesis: Principal | ICD-10-CM

## 2013-09-02 DIAGNOSIS — M549 Dorsalgia, unspecified: Secondary | ICD-10-CM | POA: Diagnosis present

## 2013-09-02 DIAGNOSIS — N179 Acute kidney failure, unspecified: Secondary | ICD-10-CM | POA: Diagnosis present

## 2013-09-02 DIAGNOSIS — M542 Cervicalgia: Secondary | ICD-10-CM | POA: Diagnosis present

## 2013-09-02 DIAGNOSIS — K649 Unspecified hemorrhoids: Secondary | ICD-10-CM | POA: Diagnosis present

## 2013-09-02 DIAGNOSIS — Z8249 Family history of ischemic heart disease and other diseases of the circulatory system: Secondary | ICD-10-CM

## 2013-09-02 DIAGNOSIS — I25119 Atherosclerotic heart disease of native coronary artery with unspecified angina pectoris: Secondary | ICD-10-CM

## 2013-09-02 DIAGNOSIS — I429 Cardiomyopathy, unspecified: Secondary | ICD-10-CM

## 2013-09-02 DIAGNOSIS — Z95810 Presence of automatic (implantable) cardiac defibrillator: Secondary | ICD-10-CM

## 2013-09-02 DIAGNOSIS — K5900 Constipation, unspecified: Secondary | ICD-10-CM

## 2013-09-02 DIAGNOSIS — K625 Hemorrhage of anus and rectum: Secondary | ICD-10-CM

## 2013-09-02 DIAGNOSIS — I2589 Other forms of chronic ischemic heart disease: Secondary | ICD-10-CM | POA: Diagnosis present

## 2013-09-02 DIAGNOSIS — Z886 Allergy status to analgesic agent status: Secondary | ICD-10-CM

## 2013-09-02 DIAGNOSIS — I739 Peripheral vascular disease, unspecified: Secondary | ICD-10-CM

## 2013-09-02 DIAGNOSIS — J4489 Other specified chronic obstructive pulmonary disease: Secondary | ICD-10-CM | POA: Diagnosis present

## 2013-09-02 DIAGNOSIS — R1310 Dysphagia, unspecified: Secondary | ICD-10-CM

## 2013-09-02 DIAGNOSIS — R634 Abnormal weight loss: Secondary | ICD-10-CM

## 2013-09-02 DIAGNOSIS — R1319 Other dysphagia: Secondary | ICD-10-CM

## 2013-09-02 DIAGNOSIS — G4733 Obstructive sleep apnea (adult) (pediatric): Secondary | ICD-10-CM | POA: Diagnosis present

## 2013-09-02 DIAGNOSIS — J449 Chronic obstructive pulmonary disease, unspecified: Secondary | ICD-10-CM | POA: Diagnosis present

## 2013-09-02 DIAGNOSIS — R946 Abnormal results of thyroid function studies: Secondary | ICD-10-CM

## 2013-09-02 DIAGNOSIS — R1010 Upper abdominal pain, unspecified: Secondary | ICD-10-CM

## 2013-09-02 DIAGNOSIS — E785 Hyperlipidemia, unspecified: Secondary | ICD-10-CM

## 2013-09-02 HISTORY — DX: Essential (primary) hypertension: I10

## 2013-09-02 HISTORY — DX: Atherosclerotic heart disease of native coronary artery without angina pectoris: I25.10

## 2013-09-02 LAB — COMPREHENSIVE METABOLIC PANEL
ALT: 25 U/L (ref 0–35)
AST: 32 U/L (ref 0–37)
Albumin: 3.8 g/dL (ref 3.5–5.2)
Alkaline Phosphatase: 57 U/L (ref 39–117)
Anion gap: 10 (ref 5–15)
BILIRUBIN TOTAL: 0.3 mg/dL (ref 0.3–1.2)
BUN: 12 mg/dL (ref 6–23)
CHLORIDE: 99 meq/L (ref 96–112)
CO2: 32 meq/L (ref 19–32)
Calcium: 9.3 mg/dL (ref 8.4–10.5)
Creatinine, Ser: 0.9 mg/dL (ref 0.50–1.10)
GFR calc Af Amer: 75 mL/min — ABNORMAL LOW (ref >90)
GFR calc non Af Amer: 65 mL/min — ABNORMAL LOW (ref >90)
Glucose, Bld: 136 mg/dL — ABNORMAL HIGH (ref 70–99)
Potassium: 4 mEq/L (ref 3.7–5.3)
SODIUM: 141 meq/L (ref 137–147)
Total Protein: 7.4 g/dL (ref 6.0–8.3)

## 2013-09-02 LAB — CBC WITH DIFFERENTIAL/PLATELET
BASOS ABS: 0 10*3/uL (ref 0.0–0.1)
BASOS PCT: 0 % (ref 0–1)
Eosinophils Absolute: 0.4 10*3/uL (ref 0.0–0.7)
Eosinophils Relative: 5 % (ref 0–5)
HCT: 42.5 % (ref 36.0–46.0)
Hemoglobin: 14.3 g/dL (ref 12.0–15.0)
LYMPHS PCT: 42 % (ref 12–46)
Lymphs Abs: 3.4 10*3/uL (ref 0.7–4.0)
MCH: 30 pg (ref 26.0–34.0)
MCHC: 33.6 g/dL (ref 30.0–36.0)
MCV: 89.1 fL (ref 78.0–100.0)
Monocytes Absolute: 0.6 10*3/uL (ref 0.1–1.0)
Monocytes Relative: 7 % (ref 3–12)
NEUTROS ABS: 3.6 10*3/uL (ref 1.7–7.7)
NEUTROS PCT: 46 % (ref 43–77)
PLATELETS: 192 10*3/uL (ref 150–400)
RBC: 4.77 MIL/uL (ref 3.87–5.11)
RDW: 12.6 % (ref 11.5–15.5)
WBC: 8.1 10*3/uL (ref 4.0–10.5)

## 2013-09-02 LAB — TROPONIN I

## 2013-09-02 LAB — LIPASE, BLOOD: Lipase: 27 U/L (ref 11–59)

## 2013-09-02 NOTE — ED Notes (Signed)
Pt c/o chest heaviness and sob
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
## 2013-09-03 ENCOUNTER — Observation Stay (HOSPITAL_COMMUNITY): Payer: Medicare Other

## 2013-09-03 ENCOUNTER — Encounter (HOSPITAL_COMMUNITY): Payer: Self-pay

## 2013-09-03 DIAGNOSIS — Z95810 Presence of automatic (implantable) cardiac defibrillator: Secondary | ICD-10-CM

## 2013-09-03 DIAGNOSIS — F17200 Nicotine dependence, unspecified, uncomplicated: Secondary | ICD-10-CM

## 2013-09-03 DIAGNOSIS — R072 Precordial pain: Secondary | ICD-10-CM

## 2013-09-03 DIAGNOSIS — I2510 Atherosclerotic heart disease of native coronary artery without angina pectoris: Secondary | ICD-10-CM

## 2013-09-03 DIAGNOSIS — I369 Nonrheumatic tricuspid valve disorder, unspecified: Secondary | ICD-10-CM

## 2013-09-03 DIAGNOSIS — E785 Hyperlipidemia, unspecified: Secondary | ICD-10-CM

## 2013-09-03 DIAGNOSIS — I209 Angina pectoris, unspecified: Secondary | ICD-10-CM

## 2013-09-03 DIAGNOSIS — I472 Ventricular tachycardia, unspecified: Secondary | ICD-10-CM

## 2013-09-03 DIAGNOSIS — I429 Cardiomyopathy, unspecified: Secondary | ICD-10-CM

## 2013-09-03 DIAGNOSIS — I4729 Other ventricular tachycardia: Secondary | ICD-10-CM

## 2013-09-03 DIAGNOSIS — R946 Abnormal results of thyroid function studies: Secondary | ICD-10-CM

## 2013-09-03 DIAGNOSIS — I10 Essential (primary) hypertension: Secondary | ICD-10-CM

## 2013-09-03 DIAGNOSIS — I739 Peripheral vascular disease, unspecified: Secondary | ICD-10-CM

## 2013-09-03 DIAGNOSIS — R079 Chest pain, unspecified: Secondary | ICD-10-CM

## 2013-09-03 LAB — TROPONIN I: Troponin I: <0.3 ng/mL (ref <0.30)

## 2013-09-03 MED ORDER — OXYCODONE HCL 5 MG PO TABS
15.0000 mg | ORAL_TABLET | Freq: Four times a day (QID) | ORAL | Status: DC | PRN
  Administered 2013-09-03 – 2013-09-07 (×14): 15 mg via ORAL
  Filled 2013-09-03 (×14): qty 3

## 2013-09-03 MED ORDER — REGADENOSON 0.4 MG/5ML IV SOLN
0.4000 mg | Freq: Once | INTRAVENOUS | Status: AC | PRN
  Administered 2013-09-03: 0.4 mg via INTRAVENOUS
  Filled 2013-09-03: qty 5

## 2013-09-03 MED ORDER — TECHNETIUM TC 99M SESTAMIBI GENERIC - CARDIOLITE
30.0000 | Freq: Once | INTRAVENOUS | Status: AC | PRN
  Administered 2013-09-03: 30 via INTRAVENOUS

## 2013-09-03 MED ORDER — ALPRAZOLAM 1 MG PO TABS
2.0000 mg | ORAL_TABLET | Freq: Three times a day (TID) | ORAL | Status: DC | PRN
  Administered 2013-09-03 – 2013-09-05 (×3): 2 mg via ORAL
  Filled 2013-09-03 (×3): qty 2

## 2013-09-03 MED ORDER — CARVEDILOL 3.125 MG PO TABS
18.7500 mg | ORAL_TABLET | Freq: Two times a day (BID) | ORAL | Status: DC
  Administered 2013-09-03 – 2013-09-07 (×8): 18.75 mg via ORAL
  Filled 2013-09-03: qty 2
  Filled 2013-09-03: qty 1
  Filled 2013-09-03: qty 2
  Filled 2013-09-03 (×3): qty 1
  Filled 2013-09-03: qty 2
  Filled 2013-09-03: qty 1
  Filled 2013-09-03 (×2): qty 2
  Filled 2013-09-03: qty 1
  Filled 2013-09-03 (×4): qty 2
  Filled 2013-09-03 (×3): qty 1

## 2013-09-03 MED ORDER — BIOTENE DRY MOUTH MT LIQD
15.0000 mL | Freq: Two times a day (BID) | OROMUCOSAL | Status: DC
  Administered 2013-09-03 – 2013-09-07 (×9): 15 mL via OROMUCOSAL

## 2013-09-03 MED ORDER — LISINOPRIL 5 MG PO TABS
5.0000 mg | ORAL_TABLET | Freq: Every day | ORAL | Status: DC
  Administered 2013-09-03 – 2013-09-07 (×5): 5 mg via ORAL
  Filled 2013-09-03 (×5): qty 1

## 2013-09-03 MED ORDER — SODIUM CHLORIDE 0.9 % IJ SOLN
INTRAMUSCULAR | Status: AC
  Administered 2013-09-03: 10 mL via INTRAVENOUS
  Filled 2013-09-03: qty 10

## 2013-09-03 MED ORDER — REGADENOSON 0.4 MG/5ML IV SOLN
INTRAVENOUS | Status: AC
  Administered 2013-09-03: 0.4 mg via INTRAVENOUS
  Filled 2013-09-03: qty 5

## 2013-09-03 MED ORDER — TECHNETIUM TC 99M SESTAMIBI - CARDIOLITE
10.0000 | Freq: Once | INTRAVENOUS | Status: AC | PRN
  Administered 2013-09-03: 11:00:00 11 via INTRAVENOUS

## 2013-09-03 MED ORDER — ACETAMINOPHEN 325 MG PO TABS
650.0000 mg | ORAL_TABLET | ORAL | Status: DC | PRN
  Administered 2013-09-03: 650 mg via ORAL
  Filled 2013-09-03: qty 2

## 2013-09-03 MED ORDER — FUROSEMIDE 20 MG PO TABS
20.0000 mg | ORAL_TABLET | Freq: Every day | ORAL | Status: DC
  Administered 2013-09-03 – 2013-09-04 (×2): 20 mg via ORAL
  Filled 2013-09-03 (×2): qty 1

## 2013-09-03 MED ORDER — CLOPIDOGREL BISULFATE 75 MG PO TABS
75.0000 mg | ORAL_TABLET | Freq: Every day | ORAL | Status: DC
  Administered 2013-09-03 – 2013-09-07 (×5): 75 mg via ORAL
  Filled 2013-09-03 (×5): qty 1

## 2013-09-03 MED ORDER — ONDANSETRON HCL 4 MG/2ML IJ SOLN
4.0000 mg | Freq: Four times a day (QID) | INTRAMUSCULAR | Status: DC | PRN
  Administered 2013-09-03 – 2013-09-06 (×5): 4 mg via INTRAVENOUS
  Filled 2013-09-03 (×5): qty 2

## 2013-09-03 MED ORDER — SODIUM CHLORIDE 0.9 % IJ SOLN
10.0000 mL | INTRAMUSCULAR | Status: DC | PRN
  Administered 2013-09-03: 10 mL via INTRAVENOUS
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
## 2013-09-03 NOTE — Progress Notes (Signed)
[facility] PROGRESS NOTE   [name] ZOX:[id number] DOB: [birth year] DOA: 09/02/2013 PCP: Pcp Not In System  HPI/Subjective: Denies any chest pain, shortness of breath.  Assessment/Plan: Principal Problem:   Chest pain Active Problems:   HYPERTENSION, UNSPECIFIED   CORONARY ATHEROSCLEROSIS NATIVE CORONARY ARTERY   CARDIOMYOPATHY, SECONDARY   VENTRICULAR TACHYCARDIA   ICD-Medtronic    Chest pain -Atypical chest pain, seen by cardiology. -Has significant cardiac history, history of cardiomyopathy status post ICD. -Cardiology recommended 2-D echocardiogram and [name] LexiScan stress test.  Hypertension -Continue home medications.  Chronic pain syndrome -Patient is narcotic dependent due to to chronic back and neck pain.  History of tobacco use -Patient used to smoke 1-1.5 pack per day for about 40 years, patient said she quit 2 years ago. -Now she uses nicotine free laconic cigarette.  COPD/OSA -Patient wears CPAP at night, continue, continue her bronchodilators.  Code Status: Full code Family Communication: Plan discussed with the patient. Disposition Plan: Remains inpatient   Consultants:  Cardiology  Procedures:  None  Antibiotics:  None   Objective: Filed Vitals:   09/03/13 0654  BP: 117/64  Pulse: 67  Temp: 97.6 F (36.4 C)  Resp: 18   No intake or output data in the 24 hours ending 09/03/13 1153 Filed Weights   09/02/13 2101 09/03/13 0046  Weight: 72.122 kg (159 lb) 71.079 kg (156 lb 11.2 oz)    Exam: General: Alert and awake, oriented x3, not in any acute distress. HEENT: anicteric sclera, pupils reactive to light and accommodation, EOMI CVS: S1-S2 clear, no murmur rubs or gallops Chest: clear to auscultation bilaterally, no wheezing, rales or rhonchi Abdomen: soft nontender, nondistended, normal bowel sounds, no organomegaly Extremities: no cyanosis, clubbing or edema noted bilaterally Neuro: Cranial nerves II-XII intact,  no focal neurological deficits  Data Reviewed: Basic Metabolic Panel:  Recent Labs Lab 09/02/13 2213  NA 141  K 4.0  CL 99  CO2 32  GLUCOSE 136*  BUN 12  CREATININE 0.90  CALCIUM 9.3   Liver Function Tests:  Recent Labs Lab 09/02/13 2213  AST 32  ALT 25  ALKPHOS 57  BILITOT 0.3  PROT 7.4  ALBUMIN 3.8    Recent Labs Lab 09/02/13 2213  LIPASE 27   No results found for this basename: AMMONIA,  in the last 168 hours CBC:  Recent Labs Lab 09/02/13 2213  WBC 8.1  NEUTROABS 3.6  HGB 14.3  HCT 42.5  MCV 89.1  PLT 192   Cardiac Enzymes:  Recent Labs Lab 09/02/13 2213 09/03/13 0139 09/03/13 0758  TROPONINI <0.30 <0.30 <0.30   BNP (last 3 results) No results found for this basename: PROBNP,  in the last 8760 hours CBG: No results found for this basename: GLUCAP,  in the last 168 hours  Micro No results found for this or any previous visit (from the past 240 hour(s)).   Studies: Dg Chest 2 View  09/02/2013   CLINICAL DATA:  Chest pain tonight.  Smoker.  EXAM: CHEST  2 VIEW  COMPARISON:  None.  FINDINGS: Cardiac pacemaker. Normal heart size and pulmonary vascularity. No focal airspace disease or consolidation in the lungs. No blunting of costophrenic angles. No pneumothorax. Calcification of the aorta.  IMPRESSION: No active cardiopulmonary disease.   Electronically Signed   By: Burman Nieves M.D.   On: 09/02/2013 22:48    Scheduled Meds: . antiseptic oral rinse  15 mL Mouth Rinse BID  . carvedilol  18.75 mg Oral BID  .  clopidogrel  75 mg Oral Daily  . furosemide  20 mg Oral Daily  . lisinopril  5 mg Oral Daily   Continuous Infusions:      Time spent: 35 minutes    Meritus Medical CenterELMAHI,[name]  [facility] Pager [phone] If 7PM-7AM, please contact night-coverage at [email or web address], password [email or web address] 09/03/2013, 11:53 AM  LOS: 1 day

## 2013-09-03 NOTE — Progress Notes (Signed)
Stress Lab Nurses Notes - [facility]  Seabron SpatesMickey H Hallstrom 09/03/2013 Reason for doing test: CAD and Chest Pain Type of test: Marlane HatcherLexiscan Cardiolite / Inpatient Rm 306 Nurse performing test: Parke PoissonPhyllis Billingsly, RN Nuclear Medicine Tech: Lyndel Pleasureyan Liles Echo Tech: Not Applicable MD performing test: S. McDowell/K.Lawrence NP Family ZO:XWRUED:Young  Test explained and consent signed: Yes.   IV started: No redness or edema and Saline lock from floor Symptoms: pressure in chest Treatment/Intervention: None Reason test stopped: protocol completed After recovery IV was: No redness or edema and Saline Lock flushed Patient to return to Nuc. Med at : 12:20 Patient discharged: Transported back to room 306 via wc Patient's Condition upon discharge was: stable Comments: During test BP 134/75 & HR 88.  Recovery BP 132/63 & HR 75.  Symptoms resolved in recovery.  Erskine SpeedBillingsley, Detravion Tester T

## 2013-09-03 NOTE — Progress Notes (Signed)
CARE MANAGEMENT NOTE 09/03/2013  Patient:  [name] SpatesCHAMBERS,[name]   Account Number:  [id number]  Date Initiated:  09/03/2013  Documentation initiated by:  Kathyrn SheriffHILDRESS,[name]  Subjective/Objective Assessment:   Patient admitted with chest pain. Patient is from home and lives with husband. Patient has walker that she uses as needed for weakness at night. Patient has CPAP and home oxygen at 2-3L that she uses at night.     Action/Plan:   Will give patient list of private duty care agencies.   Anticipated DC Date:  09/04/2013   Anticipated DC Plan:  HOME/SELF CARE      DC Planning Services  CM consult      Choice offered to / List presented to:             Status of service:  Completed, signed off Medicare Important Message given?   (If response is "NO", the following Medicare IM given date fields will be blank) Date Medicare IM given:   Medicare IM given by:   Date Additional Medicare IM given:   Additional Medicare IM given by:    Discharge Disposition:  HOME/SELF CARE  Per UR Regulation:    If discussed at Long Length of Stay Meetings, dates discussed:    Comments:  09/03/2013 1122 Christie NottinghamJessica Childeress, RN, MSN, [facility]icsCCN

## 2013-09-03 NOTE — Progress Notes (Signed)
Utilization review completed.  

## 2013-09-03 NOTE — Progress Notes (Signed)
  Echocardiogram 2D Echocardiogram has been performed.  Debar Plate 09/03/2013, 11:50 AM

## 2013-09-04 ENCOUNTER — Observation Stay (HOSPITAL_COMMUNITY): Payer: Medicare Other

## 2013-09-04 DIAGNOSIS — R110 Nausea: Secondary | ICD-10-CM | POA: Diagnosis not present

## 2013-09-04 DIAGNOSIS — R079 Chest pain, unspecified: Secondary | ICD-10-CM | POA: Diagnosis not present

## 2013-09-04 LAB — CBC
HEMATOCRIT: 41 % (ref 36.0–46.0)
Hemoglobin: 13.5 g/dL (ref 12.0–15.0)
MCH: 29.8 pg (ref 26.0–34.0)
MCHC: 32.9 g/dL (ref 30.0–36.0)
MCV: 90.5 fL (ref 78.0–100.0)
Platelets: 163 10*3/uL (ref 150–400)
RBC: 4.53 MIL/uL (ref 3.87–5.11)
RDW: 12.7 % (ref 11.5–15.5)
WBC: 8.7 10*3/uL (ref 4.0–10.5)

## 2013-09-04 LAB — COMPREHENSIVE METABOLIC PANEL
ALBUMIN: 3.7 g/dL (ref 3.5–5.2)
ALK PHOS: 50 U/L (ref 39–117)
ALT: 23 U/L (ref 0–35)
ANION GAP: 11 (ref 5–15)
AST: 29 U/L (ref 0–37)
BILIRUBIN TOTAL: 0.3 mg/dL (ref 0.3–1.2)
BUN: 24 mg/dL — AB (ref 6–23)
CHLORIDE: 96 meq/L (ref 96–112)
CO2: 32 mEq/L (ref 19–32)
Calcium: 9.2 mg/dL (ref 8.4–10.5)
Creatinine, Ser: 1.78 mg/dL — ABNORMAL HIGH (ref 0.50–1.10)
GFR calc Af Amer: 33 mL/min — ABNORMAL LOW (ref >90)
GFR calc non Af Amer: 28 mL/min — ABNORMAL LOW (ref >90)
Glucose, Bld: 100 mg/dL — ABNORMAL HIGH (ref 70–99)
POTASSIUM: 4.3 meq/L (ref 3.7–5.3)
SODIUM: 139 meq/L (ref 137–147)
Total Protein: 7.2 g/dL (ref 6.0–8.3)

## 2013-09-04 LAB — LIPASE, BLOOD: Lipase: 33 U/L (ref 11–59)

## 2013-09-04 MED ORDER — PANTOPRAZOLE SODIUM 40 MG IV SOLR
40.0000 mg | INTRAVENOUS | Status: DC
  Administered 2013-09-04 – 2013-09-05 (×2): 40 mg via INTRAVENOUS
  Filled 2013-09-04 (×2): qty 40

## 2013-09-04 MED ORDER — SODIUM CHLORIDE 0.9 % IV BOLUS (SEPSIS)
500.0000 mL | Freq: Once | INTRAVENOUS | Status: AC
  Administered 2013-09-04: 500 mL via INTRAVENOUS

## 2013-09-04 MED ORDER — OXYCODONE HCL 5 MG PO TABS
7.5000 mg | ORAL_TABLET | Freq: Once | ORAL | Status: AC
  Administered 2013-09-04: 7.5 mg via ORAL
  Filled 2013-09-04: qty 2

## 2013-09-04 MED ORDER — SODIUM CHLORIDE 0.9 % IV SOLN
INTRAVENOUS | Status: AC
  Administered 2013-09-04 – 2013-09-05 (×2): via INTRAVENOUS

## 2013-09-04 NOTE — Progress Notes (Signed)
[facility] PROGRESS NOTE  KOA PALLA ZOX:[id number] DOB: 09-16-1945 DOA: 09/02/2013  Brief HPI: 68yo female presented with chest pain.  Past medical history:  Past Medical History  Diagnosis Date  . Cardiomyopathy, ischemic     Low ejection fraction of 19%,but normalized to 55% in 2008.  Marland Kitchen Coronary atherosclerosis of native coronary artery 10/2002    Diffuse moderate  . Ventricular tachycardia, nonsustained   . COPD (chronic obstructive pulmonary disease)   . Essential hypertension, benign   . OSA (obstructive sleep apnea)     CPAP Compliant  . Chronic pain syndrome     Consultants: Cardiology  Procedures:  Cardiac Stress Test: Overall low risk Lexiscan Cardiolite. There were no diagnostic ST segment changes. Perfusion imaging is most consistent with breast attenuation artifact, no clear evidence of scar or ischemia. LVEF calculated at 46% with no focal wall motion abnormalities, and normal volumes.  2D ECHO: Impressions: Upper normal LV wall thickness with LVEF 50-55%, grade 2 diastolic dysfunction. Mild left atrial enlargement. Moderately sclerotic aortic valve. Device wire in right heart. PASP 30 mmHg.  Antibiotics: None  Subjective: Patient complains of nausea today. Denies chest pain currently. Some abdominal discomfort.  Objective: Vital Signs  Filed Vitals:   09/03/13 1456 09/03/13 2254 09/04/13 0442 09/04/13 1059  BP: 114/70 156/111 103/60 105/72  Pulse: 75 71 72   Temp: 97.8 F (36.6 C) 97.9 F (36.6 C) 97.5 F (36.4 C)   TempSrc:  Axillary Oral   Resp: 18 20 18    Height:      Weight:      SpO2: 96% 96% 100%     Intake/Output Summary (Last 24 hours) at 09/04/13 1402 Last data filed at 09/04/13 1317  Gross per 24 hour  Intake    480 ml  Output      0 ml  Net    480 ml   Filed Weights   09/02/13 2101 09/03/13 0046  Weight: 72.122 kg (159 lb) 71.079 kg (156 lb 11.2 oz)    General appearance: alert, cooperative, appears stated  age, no distress and moderately obese Head: Normocephalic, without obvious abnormality, atraumatic Throat: lips, mucosa, and tongue normal; teeth and gums normal Resp: clear to auscultation bilaterally Cardio: regular rate and rhythm, S1, S2 normal, no murmur, click, rub or gallop GI: soft, mildly tender in RUQ and epigastrium. No rebound rigidity or guarding. No masses. BS present. Neurologic: No focal deficits  Lab Results:  Basic Metabolic Panel:  Recent Labs Lab 09/02/13 2213 09/04/13 1033  NA 141 139  K 4.0 4.3  CL 99 96  CO2 32 32  GLUCOSE 136* 100*  BUN 12 24*  CREATININE 0.90 1.78*  CALCIUM 9.3 9.2   Liver Function Tests:  Recent Labs Lab 09/02/13 2213 09/04/13 1033  AST 32 29  ALT 25 23  ALKPHOS 57 50  BILITOT 0.3 0.3  PROT 7.4 7.2  ALBUMIN 3.8 3.7    Recent Labs Lab 09/02/13 2213  LIPASE 27   CBC:  Recent Labs Lab 09/02/13 2213 09/04/13 1033  WBC 8.1 8.7  NEUTROABS 3.6  --   HGB 14.3 13.5  HCT 42.5 41.0  MCV 89.1 90.5  PLT 192 163   Cardiac Enzymes:  Recent Labs Lab 09/02/13 2213 09/03/13 0139 09/03/13 0758 09/03/13 1341  TROPONINI <0.30 <0.30 <0.30 <0.30     Studies/Results: Dg Chest 2 View  09/02/2013   CLINICAL DATA:  Chest pain tonight.  Smoker.  EXAM: CHEST  2 VIEW  COMPARISON:  None.  FINDINGS: Cardiac pacemaker. Normal heart size and pulmonary vascularity. No focal airspace disease or consolidation in the lungs. No blunting of costophrenic angles. No pneumothorax. Calcification of the aorta.  IMPRESSION: No active cardiopulmonary disease.   Electronically Signed   By: Burman NievesWilliam  Stevens M.D.   On: 09/02/2013 22:48   [place] Abdomen Complete  09/04/2013   CLINICAL DATA:  Abdominal pain and nausea  EXAM: ULTRASOUND ABDOMEN COMPLETE  COMPARISON:  None.  FINDINGS: Gallbladder:  No gallstones or wall thickening visualized. There is no pericholecystic fluid. No sonographic Murphy sign noted.  Common bile duct:  Diameter: 6 mm. There is no  intrahepatic, common hepatic, or common bile duct dilatation.  Liver:  No focal lesion identified. Liver is prominent measuring 15.5 cm in length. Liver echogenicity is diffusely increased.  IVC:  No abnormality visualized.  Pancreas:  Visualized portion unremarkable. The pancreatic tail is incompletely visualized due to overlying gas.  Spleen:  Size and appearance within normal limits.  Right Kidney:  Length: 9.7 cm. Echogenicity within normal limits. No mass or hydronephrosis visualized.  Left Kidney:  Length: 10.0 cm. Echogenicity within normal limits. No mass or hydronephrosis visualized.  Abdominal aorta:  No aneurysm visualized.  Other findings:  No demonstrable ascites.  IMPRESSION: Liver mildly enlarged with diffuse increased echogenicity consistent with fatty change. While no focal liver lesions are identified, it must be cautioned that the sensitivity of ultrasound for focal liver lesions is diminished in this circumstance.  Portions of the pancreatic tailor obscured by gas. Visualized portions of pancreas appear normal.  Study otherwise unremarkable.   Electronically Signed   By: Bretta BangWilliam  Woodruff M.D.   On: 09/04/2013 10:34   Nm Myocar Single W/spect W/wall Motion And Ef  09/03/2013   CLINICAL DATA:  68 year old woman with history of CAD, ischemic cardiomyopathy with subsequent normalization of LVEF, prior ICD placement, COPD, and hypertension, now referred with atypical chest pain for the assessment of ischemia.  EXAM: MYOCARDIAL IMAGING WITH SPECT (REST AND PHARMACOLOGIC-STRESS)  GATED LEFT VENTRICULAR WALL MOTION STUDY  LEFT VENTRICULAR EJECTION FRACTION  TECHNIQUE: Standard myocardial SPECT imaging was performed after resting intravenous injection of 10 mCi [id number] sestamibi. Subsequently, intravenous infusion of Lexiscan was performed under the supervision of the Cardiology staff. At peak effect of the drug, 30 mCi Tc-4071m sestamibi was injected intravenously and standard myocardial SPECT imaging  was performed. Quantitative gated imaging was also performed to evaluate left ventricular wall motion, and estimate left ventricular ejection fraction.  FINDINGS: Baseline tracing shows sinus rhythm at 68 beats per min with poor R wave progression and nonspecific ST T changes. Lexiscan bolus was given in standard fashion. Heart rate increased from 66 beats per min up to 91 beats per min, and blood pressure increased from 107/69 up to 134/75. No chest pain was reported. No diagnostic ST segment changes were noted over baseline, there were rare PVCs.  Analysis of the overall perfusion data shows adequate radiotracer uptake within the myocardium, some breast attenuation as well.  Tomographic views were obtained using the short axis, vertical long axis, and horizontal long axis planes. There is a small, mild intensity, anteroapical defect that is consistent with soft tissue attenuation, fixed. No large reversible defects are noted to indicate ischemia.  Gated imaging reveals an EDV of 63, a ESV of 34, LVEF of 46%, and TID ratio of 1.23. No focal wall motion abnormalities noted.  IMPRESSION: Overall low risk Lexiscan Cardiolite. There were no diagnostic ST segment changes. Perfusion  imaging is most consistent with breast attenuation artifact, no clear evidence of scar or ischemia. LVEF calculated at 46% with no focal wall motion abnormalities, and normal volumes.   Electronically Signed   By: Nona Dell M.D.   On: 09/03/2013 13:52    Medications:  Scheduled: . antiseptic oral rinse  15 mL Mouth Rinse BID  . carvedilol  18.75 mg Oral BID  . clopidogrel  75 mg Oral Daily  . furosemide  20 mg Oral Daily  . lisinopril  5 mg Oral Daily  . pantoprazole (PROTONIX) IV  40 mg Intravenous Q24H   Continuous: . sodium chloride     ZOX:[id number], alprazolam, ondansetron (ZOFRAN) IV, oxyCODONE, sodium chloride  Assessment/Plan:  Principal Problem:   Chest pain Active Problems:   HYPERTENSION,  UNSPECIFIED   CORONARY ATHEROSCLEROSIS NATIVE CORONARY ARTERY   CARDIOMYOPATHY, SECONDARY   VENTRICULAR TACHYCARDIA   ICD-Medtronic    Chest pain  Atypical chest pain. Stress test was low risk. ECHO also did not show any concerning findings. Cleared by cardiology. She has significant cardiac history, history of cardiomyopathy status post ICD.    Nausea with Abdominal Pain LFT normal. US abdomen did not reveal any GB issues. Check Lipase. Antiemetics.  Elevated Creatinine Give IVF and recheck in AM.  Hypertension  Continue home medications.   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   COPD/OSA  Stable. CPCP.   Code Status: Full Code  DVT Prophylaxis: SCD's    Family Communication: Discussed with patient  Disposition Plan: Possible DC in AM.    LOS: 2 days   [facility]  [facility] Pager [phone] 09/04/2013, 2:02 PM  If 8PM-8AM, please contact night-coverage at [email or web address], password [email or web address]   Disclaimer: This note was dictated with voice recognition software. Similar sounding words can inadvertently be transcribed and may not be corrected upon review.

## 2013-09-05 ENCOUNTER — Observation Stay (HOSPITAL_COMMUNITY): Payer: Medicare Other

## 2013-09-05 ENCOUNTER — Encounter (HOSPITAL_COMMUNITY): Payer: Self-pay | Admitting: Radiology

## 2013-09-05 DIAGNOSIS — R110 Nausea: Secondary | ICD-10-CM | POA: Diagnosis not present

## 2013-09-05 DIAGNOSIS — R109 Unspecified abdominal pain: Secondary | ICD-10-CM | POA: Diagnosis not present

## 2013-09-05 DIAGNOSIS — R1314 Dysphagia, pharyngoesophageal phase: Secondary | ICD-10-CM

## 2013-09-05 DIAGNOSIS — Z823 Family history of stroke: Secondary | ICD-10-CM | POA: Diagnosis not present

## 2013-09-05 DIAGNOSIS — K649 Unspecified hemorrhoids: Secondary | ICD-10-CM | POA: Diagnosis present

## 2013-09-05 DIAGNOSIS — K5900 Constipation, unspecified: Secondary | ICD-10-CM

## 2013-09-05 DIAGNOSIS — M542 Cervicalgia: Secondary | ICD-10-CM | POA: Diagnosis present

## 2013-09-05 DIAGNOSIS — I429 Cardiomyopathy, unspecified: Secondary | ICD-10-CM | POA: Diagnosis present

## 2013-09-05 DIAGNOSIS — Z95810 Presence of automatic (implantable) cardiac defibrillator: Secondary | ICD-10-CM | POA: Diagnosis not present

## 2013-09-05 DIAGNOSIS — I10 Essential (primary) hypertension: Secondary | ICD-10-CM | POA: Diagnosis present

## 2013-09-05 DIAGNOSIS — I2510 Atherosclerotic heart disease of native coronary artery without angina pectoris: Secondary | ICD-10-CM | POA: Diagnosis present

## 2013-09-05 DIAGNOSIS — J449 Chronic obstructive pulmonary disease, unspecified: Secondary | ICD-10-CM | POA: Diagnosis present

## 2013-09-05 DIAGNOSIS — G4733 Obstructive sleep apnea (adult) (pediatric): Secondary | ICD-10-CM | POA: Diagnosis present

## 2013-09-05 DIAGNOSIS — Z8249 Family history of ischemic heart disease and other diseases of the circulatory system: Secondary | ICD-10-CM | POA: Diagnosis not present

## 2013-09-05 DIAGNOSIS — I959 Hypotension, unspecified: Secondary | ICD-10-CM | POA: Diagnosis present

## 2013-09-05 DIAGNOSIS — Z800 Family history of malignant neoplasm of digestive organs: Secondary | ICD-10-CM | POA: Diagnosis not present

## 2013-09-05 DIAGNOSIS — R1319 Other dysphagia: Secondary | ICD-10-CM | POA: Diagnosis present

## 2013-09-05 DIAGNOSIS — K219 Gastro-esophageal reflux disease without esophagitis: Secondary | ICD-10-CM | POA: Diagnosis present

## 2013-09-05 DIAGNOSIS — K3184 Gastroparesis: Secondary | ICD-10-CM | POA: Diagnosis present

## 2013-09-05 DIAGNOSIS — R079 Chest pain, unspecified: Secondary | ICD-10-CM | POA: Diagnosis present

## 2013-09-05 DIAGNOSIS — G894 Chronic pain syndrome: Secondary | ICD-10-CM | POA: Diagnosis present

## 2013-09-05 DIAGNOSIS — I2589 Other forms of chronic ischemic heart disease: Secondary | ICD-10-CM | POA: Diagnosis present

## 2013-09-05 DIAGNOSIS — M549 Dorsalgia, unspecified: Secondary | ICD-10-CM | POA: Diagnosis present

## 2013-09-05 DIAGNOSIS — N179 Acute kidney failure, unspecified: Secondary | ICD-10-CM | POA: Diagnosis present

## 2013-09-05 DIAGNOSIS — K625 Hemorrhage of anus and rectum: Secondary | ICD-10-CM

## 2013-09-05 DIAGNOSIS — I252 Old myocardial infarction: Secondary | ICD-10-CM | POA: Diagnosis not present

## 2013-09-05 DIAGNOSIS — E860 Dehydration: Secondary | ICD-10-CM | POA: Diagnosis present

## 2013-09-05 DIAGNOSIS — I4729 Other ventricular tachycardia: Secondary | ICD-10-CM | POA: Diagnosis present

## 2013-09-05 DIAGNOSIS — I472 Ventricular tachycardia: Secondary | ICD-10-CM | POA: Diagnosis present

## 2013-09-05 DIAGNOSIS — F17200 Nicotine dependence, unspecified, uncomplicated: Secondary | ICD-10-CM | POA: Diagnosis present

## 2013-09-05 DIAGNOSIS — R634 Abnormal weight loss: Secondary | ICD-10-CM

## 2013-09-05 DIAGNOSIS — Z886 Allergy status to analgesic agent status: Secondary | ICD-10-CM | POA: Diagnosis not present

## 2013-09-05 DIAGNOSIS — F259 Schizoaffective disorder, unspecified: Secondary | ICD-10-CM | POA: Diagnosis present

## 2013-09-05 DIAGNOSIS — E785 Hyperlipidemia, unspecified: Secondary | ICD-10-CM | POA: Diagnosis not present

## 2013-09-05 LAB — COMPREHENSIVE METABOLIC PANEL
ALBUMIN: 3.3 g/dL — AB (ref 3.5–5.2)
ALT: 22 U/L (ref 0–35)
AST: 26 U/L (ref 0–37)
Alkaline Phosphatase: 49 U/L (ref 39–117)
Anion gap: 10 (ref 5–15)
BUN: 22 mg/dL (ref 6–23)
CALCIUM: 8.5 mg/dL (ref 8.4–10.5)
CO2: 27 meq/L (ref 19–32)
Chloride: 104 mEq/L (ref 96–112)
Creatinine, Ser: 1.54 mg/dL — ABNORMAL HIGH (ref 0.50–1.10)
GFR calc Af Amer: 39 mL/min — ABNORMAL LOW (ref >90)
GFR calc non Af Amer: 34 mL/min — ABNORMAL LOW (ref >90)
Glucose, Bld: 117 mg/dL — ABNORMAL HIGH (ref 70–99)
Potassium: 4.8 mEq/L (ref 3.7–5.3)
SODIUM: 141 meq/L (ref 137–147)
TOTAL PROTEIN: 6.8 g/dL (ref 6.0–8.3)
Total Bilirubin: 0.2 mg/dL — ABNORMAL LOW (ref 0.3–1.2)

## 2013-09-05 LAB — CBC
HCT: 39.6 % (ref 36.0–46.0)
Hemoglobin: 12.8 g/dL (ref 12.0–15.0)
MCH: 29.1 pg (ref 26.0–34.0)
MCHC: 32.3 g/dL (ref 30.0–36.0)
MCV: 90 fL (ref 78.0–100.0)
Platelets: 180 10*3/uL (ref 150–400)
RBC: 4.4 MIL/uL (ref 3.87–5.11)
RDW: 12.7 % (ref 11.5–15.5)
WBC: 9.8 10*3/uL (ref 4.0–10.5)

## 2013-09-05 MED ORDER — SODIUM CHLORIDE 0.9 % IV SOLN
INTRAVENOUS | Status: AC
  Administered 2013-09-05: 10:00:00 via INTRAVENOUS

## 2013-09-05 MED ORDER — PANTOPRAZOLE SODIUM 40 MG PO TBEC
40.0000 mg | DELAYED_RELEASE_TABLET | Freq: Two times a day (BID) | ORAL | Status: DC
  Administered 2013-09-05 – 2013-09-07 (×4): 40 mg via ORAL
  Filled 2013-09-05 (×4): qty 1

## 2013-09-05 NOTE — Progress Notes (Signed)
Ambulated patient in hallway. Tolerated well 

## 2013-09-05 NOTE — Progress Notes (Signed)
Discussed patients low BP with physician. Will continue medicines as ordered.
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
## 2013-09-05 NOTE — Consult Note (Signed)
REVIEWED. GES JUL 31 ~9 AM. TCS/EGD Dx: NAUSEA, BRBPR/WEIGHT LOSS W/ MAC DUE TO POLYPHARMACY.

## 2013-09-05 NOTE — Progress Notes (Addendum)
[facility] PROGRESS NOTE  [name] SpatesMickey H [name] WUJ:[id number] DOB: [birth year] DOA: 09/02/2013  Brief HPI: 68yo female presented with chest pain.  Past medical history:  Past Medical History  Diagnosis Date  . Cardiomyopathy, ischemic     Low ejection fraction of 19%,but normalized to 55% in 2008.  Marland Kitchen. Coronary atherosclerosis of native coronary artery 10/2002    Diffuse moderate  . Ventricular tachycardia, nonsustained   . COPD (chronic obstructive pulmonary disease)   . Essential hypertension, benign   . OSA (obstructive sleep apnea)     CPAP Compliant  . Chronic pain syndrome     Consultants: Cardiology, GI  Procedures:  Cardiac Stress Test: Overall low risk Lexiscan Cardiolite. There were no diagnostic ST segment changes. Perfusion imaging is most consistent with breast attenuation artifact, no clear evidence of scar or ischemia. LVEF calculated at 46% with no focal wall motion abnormalities, and normal volumes.  2D ECHO: Impressions: Upper normal LV wall thickness with LVEF 50-55%, grade 2 diastolic dysfunction. Mild left atrial enlargement. Moderately sclerotic aortic valve. Device wire in right heart. PASP 30 mmHg.  Antibiotics: None  Subjective: Patient continues to have nausea and upper abdominal discomfort. Also complains of constipation. Also mentions occasional blood in stool. [place] [facility]. Also complains of headache staring this morning.  Objective: Vital Signs  Filed Vitals:   09/04/13 2230 09/05/13 0100 09/05/13 0550 09/05/13 0809  BP: 105/61 113/67 120/64 105/51  Pulse: 70  80 67  Temp:   99.7 F (37.6 C)   TempSrc:   Oral   Resp: 16  18   Height:      Weight:      SpO2: 95%  95%     Intake/Output Summary (Last 24 hours) at 09/05/13 0824 Last data filed at 09/05/13 0600  Gross per 24 hour  Intake 2221.67 ml  Output    450 ml  Net 1771.67 ml   Filed Weights   09/02/13 2101 09/03/13 0046  Weight: 72.122  kg (159 lb) 71.079 kg (156 lb 11.2 oz)    General appearance: alert, cooperative, appears stated age, no distress and moderately obese Resp: clear to auscultation bilaterally Cardio: regular rate and rhythm, S1, S2 normal, no murmur, click, rub or gallop GI: soft, mildly tender in RUQ and epigastrium. No rebound rigidity or guarding. No masses. BS present. Neurologic: No focal deficits  Lab Results:  Basic Metabolic Panel:  Recent Labs Lab 09/02/13 2213 09/04/13 1033 09/05/13 0543  NA 141 139 141  K 4.0 4.3 4.8  CL 99 96 104  CO2 32 32 27  GLUCOSE 136* 100* 117*  BUN 12 24* 22  CREATININE 0.90 1.78* 1.54*  CALCIUM 9.3 9.2 8.5   Liver Function Tests:  Recent Labs Lab 09/02/13 2213 09/04/13 1033 09/05/13 0543  AST 32 29 26  ALT 25 23 22   ALKPHOS 57 50 49  BILITOT 0.3 0.3 0.2*  PROT 7.4 7.2 6.8  ALBUMIN 3.8 3.7 3.3*    Recent Labs Lab 09/02/13 2213 09/04/13 1015  LIPASE 27 33   CBC:  Recent Labs Lab 09/02/13 2213 09/04/13 1033 09/05/13 0543  WBC 8.1 8.7 9.8  NEUTROABS 3.6  --   --   HGB 14.3 13.5 12.8  HCT 42.5 41.0 39.6  MCV 89.1 90.5 90.0  PLT 192 163 180   Cardiac Enzymes:  Recent Labs Lab 09/02/13 2213 09/03/13 0139 09/03/13 0758 09/03/13 1341  TROPONINI <0.30 <0.30 <0.30 <0.30  Studies/Results: US Abdomen Complete  09/04/2013   CLINICAL DATA:  Abdominal pain and nausea  EXAM: ULTRASOUND ABDOMEN COMPLETE  COMPARISON:  None.  FINDINGS: Gallbladder:  No gallstones or wall thickening visualized. There is no pericholecystic fluid. No sonographic Murphy sign noted.  Common bile duct:  Diameter: 6 mm. There is no intrahepatic, common hepatic, or common bile duct dilatation.  Liver:  No focal lesion identified. Liver is prominent measuring 15.5 cm in length. Liver echogenicity is diffusely increased.  IVC:  No abnormality visualized.  Pancreas:  Visualized portion unremarkable. The pancreatic tail is incompletely visualized due to overlying  gas.  Spleen:  Size and appearance within normal limits.  Right Kidney:  Length: 9.7 cm. Echogenicity within normal limits. No mass or hydronephrosis visualized.  Left Kidney:  Length: 10.0 cm. Echogenicity within normal limits. No mass or hydronephrosis visualized.  Abdominal aorta:  No aneurysm visualized.  Other findings:  No demonstrable ascites.  IMPRESSION: Liver mildly enlarged with diffuse increased echogenicity consistent with fatty change. While no focal liver lesions are identified, it must be cautioned that the sensitivity of ultrasound for focal liver lesions is diminished in this circumstance.  Portions of the pancreatic tailor obscured by gas. Visualized portions of pancreas appear normal.  Study otherwise unremarkable.   Electronically Signed   By: Bretta Bang M.D.   On: 09/04/2013 10:34   Nm Myocar Single W/spect W/wall Motion And Ef  09/03/2013   CLINICAL DATA:  68 year old woman with history of CAD, ischemic cardiomyopathy with subsequent normalization of LVEF, prior ICD placement, COPD, and hypertension, now referred with atypical chest pain for the assessment of ischemia.  EXAM: MYOCARDIAL IMAGING WITH SPECT (REST AND PHARMACOLOGIC-STRESS)  GATED LEFT VENTRICULAR WALL MOTION STUDY  LEFT VENTRICULAR EJECTION FRACTION  TECHNIQUE: Standard myocardial SPECT imaging was performed after resting intravenous injection of 10 mCi Tc-42m sestamibi. Subsequently, intravenous infusion of Lexiscan was performed under the supervision of the Cardiology staff. At peak effect of the drug, 30 mCi Tc-31m sestamibi was injected intravenously and standard myocardial SPECT imaging was performed. Quantitative gated imaging was also performed to evaluate left ventricular wall motion, and estimate left ventricular ejection fraction.  FINDINGS: Baseline tracing shows sinus rhythm at 68 beats per min with poor R wave progression and nonspecific ST T changes. Lexiscan bolus was given in standard fashion. Heart rate  increased from 66 beats per min up to 91 beats per min, and blood pressure increased from 107/69 up to 134/75. No chest pain was reported. No diagnostic ST segment changes were noted over baseline, there were rare PVCs.  Analysis of the overall perfusion data shows adequate radiotracer uptake within the myocardium, some breast attenuation as well.  Tomographic views were obtained using the short axis, vertical long axis, and horizontal long axis planes. There is a small, mild intensity, anteroapical defect that is consistent with soft tissue attenuation, fixed. No large reversible defects are noted to indicate ischemia.  Gated imaging reveals an EDV of 63, a ESV of 34, LVEF of 46%, and TID ratio of 1.23. No focal wall motion abnormalities noted.  IMPRESSION: Overall low risk Lexiscan Cardiolite. There were no diagnostic ST segment changes. Perfusion imaging is most consistent with breast attenuation artifact, no clear evidence of scar or ischemia. LVEF calculated at 46% with no focal wall motion abnormalities, and normal volumes.   Electronically Signed   By: Nona Dell M.D.   On: 09/03/2013 13:52    Medications:  Scheduled: . antiseptic oral rinse  15  mL Mouth Rinse BID  . carvedilol  18.75 mg Oral BID  . clopidogrel  75 mg Oral Daily  . lisinopril  5 mg Oral Daily  . pantoprazole (PROTONIX) IV  40 mg Intravenous Q24H   Continuous: . sodium chloride     WUJ:[id number], alprazolam, ondansetron (ZOFRAN) IV, oxyCODONE, sodium chloride  Assessment/Plan:  Principal Problem:   Chest pain Active Problems:   HYPERTENSION, UNSPECIFIED   CORONARY ATHEROSCLEROSIS NATIVE CORONARY ARTERY   CARDIOMYOPATHY, SECONDARY   VENTRICULAR TACHYCARDIA   ICD-Medtronic    Chest pain  Atypical chest pain. Stress test was low risk. ECHO also did not show any concerning findings. Cleared by cardiology. She has significant cardiac history, history of cardiomyopathy status post ICD. Hypotensive last night  which improved with IVF bolus.  Nausea with Abdominal Pain LFT and Lipase normal. US abdomen did not reveal any GB issues. Since symptoms persist will consult GI. Continue PRN Antiemetics. Continue PPI. Might need endoscopy and/or CT.  Elevated Creatinine Improved with IVF. Continue fluids for now.   Hypertension  Continue home medications.   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   COPD/OSA  Stable. CPCP.   Code Status: Full Code  DVT Prophylaxis: SCD's    Family Communication: Discussed with patient  Disposition Plan: Not ready for discharge.    LOS: 3 days   [facility]  [facility] Pager [phone] 09/05/2013, 8:24 AM  If 8PM-8AM, please contact night-coverage at [email or web address], password [email or web address]   Disclaimer: This note was dictated with voice recognition software. Similar sounding words can inadvertently be transcribed and may not be corrected upon review.

## 2013-09-05 NOTE — Progress Notes (Signed)
Patient IV leaking and not in arm well. IV removed and new attempt made but unable. Nursing supervisor notified and she will come try

## 2013-09-06 ENCOUNTER — Telehealth: Payer: Self-pay | Admitting: Gastroenterology

## 2013-09-06 ENCOUNTER — Inpatient Hospital Stay (HOSPITAL_COMMUNITY): Payer: Medicare Other

## 2013-09-06 LAB — CBC
HEMATOCRIT: 36.7 % (ref 36.0–46.0)
HEMOGLOBIN: 11.9 g/dL — AB (ref 12.0–15.0)
MCH: 29.3 pg (ref 26.0–34.0)
MCHC: 32.4 g/dL (ref 30.0–36.0)
MCV: 90.4 fL (ref 78.0–100.0)
Platelets: 131 10*3/uL — ABNORMAL LOW (ref 150–400)
RBC: 4.06 MIL/uL (ref 3.87–5.11)
RDW: 12.6 % (ref 11.5–15.5)
WBC: 8.1 10*3/uL (ref 4.0–10.5)

## 2013-09-06 LAB — BASIC METABOLIC PANEL
Anion gap: 7 (ref 5–15)
BUN: 17 mg/dL (ref 6–23)
CO2: 29 meq/L (ref 19–32)
CREATININE: 1.07 mg/dL (ref 0.50–1.10)
Calcium: 8.7 mg/dL (ref 8.4–10.5)
Chloride: 106 mEq/L (ref 96–112)
GFR calc Af Amer: 61 mL/min — ABNORMAL LOW (ref >90)
GFR calc non Af Amer: 52 mL/min — ABNORMAL LOW (ref >90)
GLUCOSE: 109 mg/dL — AB (ref 70–99)
Potassium: 4.6 mEq/L (ref 3.7–5.3)
Sodium: 142 mEq/L (ref 137–147)

## 2013-09-06 MED ORDER — POLYETHYLENE GLYCOL 3350 17 G PO PACK
17.0000 g | PACK | Freq: Once | ORAL | Status: AC
Start: 2013-09-06 — End: 2013-09-06
  Administered 2013-09-06: 17 g via ORAL
  Filled 2013-09-06: qty 1

## 2013-09-06 MED ORDER — METOCLOPRAMIDE HCL 10 MG PO TABS
5.0000 mg | ORAL_TABLET | Freq: Three times a day (TID) | ORAL | Status: DC
  Administered 2013-09-06 – 2013-09-07 (×4): 5 mg via ORAL
  Filled 2013-09-06 (×4): qty 1

## 2013-09-06 MED ORDER — POLYETHYLENE GLYCOL 3350 17 G PO PACK: 17.0000 g | PACK | Freq: Every day | ORAL | Status: DC | PRN

## 2013-09-06 MED ORDER — TECHNETIUM TC 99M SULFUR COLLOID
2.0000 | Freq: Once | INTRAVENOUS | Status: AC | PRN
  Administered 2013-09-06: 2 via ORAL

## 2013-09-06 NOTE — Progress Notes (Signed)
Pt ambulates to the bathroom. When asked if she will take a walk in the hallway, she states she will walk during the day. Pt educated on the benefits of movement . Pt voices understanding.

## 2013-09-06 NOTE — Telephone Encounter (Signed)
Please schedule TCS/EGD/ED with [facility] in OR (polypharmacy). ZO:XWRUE,AVx:brbpr,wt loss, nausea, esophageal dysphagia, abd pain. Patient likely to go home from hospital within 24 hours. Please triage for procedure.

## 2013-09-06 NOTE — Progress Notes (Signed)
REVIEWED. GES shows gastroparesis. ADD REGLAN 5 MG QACHS. LOW FAT/SOFT MECHANICAL DIET.  MEATS SHOULD BE CHOPPED OR GROUND ONLY. DO NOT EAT CHUNKS OF ANYTHING.

## 2013-09-06 NOTE — Progress Notes (Signed)
  Subjective:  Feels better. Wants to go home. Less abdominal pain and nausea. Tolerated diet yesterday. No BM since Monday.  Objective: Vital signs in last 24 hours: Temp:  [97.6 F (36.4 C)-98.6 F (37 C)] 97.8 F (36.6 C) (07/31 0531) Pulse Rate:  [66-82] 69 (07/31 0531) Resp:  [17-20] 17 (07/31 0531) BP: (94-125)/(48-77) 125/52 mmHg (07/31 0531) SpO2:  [94 %-95 %] 95 % (07/31 0531) Last BM Date: 09/02/13 General:   Alert,  Well-developed, well-nourished, pleasant and cooperative in NAD Head:  Normocephalic and atraumatic. Eyes:  Sclera clear, no icterus.  Abdomen:  Soft, nontender and nondistended.   Normal bowel sounds, without guarding, and without rebound.   Extremities:  Without clubbing, deformity or edema. Neurologic:  Alert and  oriented x4;  grossly normal neurologically. Skin:  Intact without significant lesions or rashes. Psych:  Alert and cooperative. Normal mood and affect.  Intake/Output from previous day: 07/30 0701 - 07/31 0700 In: 1310 [P.O.:560; I.V.:750] Out: -  Intake/Output this shift:    Lab Results: CBC  Recent Labs  09/04/13 1033 09/05/13 0543 09/06/13 0651  WBC 8.7 9.8 8.1  HGB 13.5 12.8 11.9*  HCT 41.0 39.6 36.7  MCV 90.5 90.0 90.4  PLT 163 180 131*   BMET  Recent Labs  09/04/13 1033 09/05/13 0543 09/06/13 0651  NA 139 141 142  K 4.3 4.8 4.6  CL 96 104 106  CO2 32 27 29  GLUCOSE 100* 117* 109*  BUN 24* 22 17  CREATININE 1.78* 1.54* 1.07  CALCIUM 9.2 8.5 8.7   LFTs  Recent Labs  09/04/13 1033 09/05/13 0543  BILITOT 0.3 0.2*  ALKPHOS 50 49  AST 29 26  ALT 23 22  PROT 7.2 6.8  ALBUMIN 3.7 3.3*    Recent Labs  09/04/13 1015  LIPASE 33    Assessment: 68 year old lady who presented with chest pain as outlined above. Cardiac evaluation has included negative troponins, echocardiogram, LexiScan Cardiolite (low risk). We were consulted regarding chronic nausea associated with weight loss, postprandial abdominal  swelling and upper abdominal discomfort, chronic constipation with intermittent bright red blood per rectum. Patient also describes esophageal dysphagia. Abdominal ultrasound unremarkable with regards to gallbladder. Differential diagnosis includes GERD, gastritis, peptic ulcer disease. Suspect element of gastroparesis possibly narcotic-related. Discussed at length with Dr. Darrick PennaFields. At this time we'll evaluate for gastroparesis. We will plan for a colonoscopy with upper endoscopy and possible esophageal dilation with propofol as an outpatient.  Consumed 100% meals yesterday.   Plan: 1. Miralax 17g daily as needed. 2. Follow-up GES today. 3. Plan for outpatient colonoscopy and EGD/ED with MAC due to polypharmacy. Office to arrange.    LOS: 4 days   Tana CoastLeslie Lewis  09/06/2013, 7:52 AM

## 2013-09-06 NOTE — Care Management Note (Signed)
    Page 1 of 1   09/06/2013     11:25:36 AM CARE MANAGEMENT NOTE 09/06/2013  Patient:  [name],[name] H   Account Number:  [id number]  Date Initiated:  09/03/2013  Documentation initiated by:  Kathyrn SheriffHILDRESS,JESSICA  Subjective/Objective Assessment:   Patient admitted with chest pain. Patient is from home and lives with husband. Patient has walker that she uses as needed for weakness at night. Patient has CPAP and home oxygen at 2-3L that she uses at night.     Action/Plan:   Will give patient list of private duty care agencies.   Anticipated DC Date:  09/04/2013   Anticipated DC Plan:  HOME/SELF CARE      DC Planning Services  CM consult      Choice offered to / List presented to:             Status of service:  Completed, signed off Medicare Important Message given?  YES (If response is "NO", the following Medicare IM given date fields will be blank) Date Medicare IM given:  09/06/2013 Medicare IM given by:  Sharrie RothmanBLACKWELL,Corneluis Allston C Date Additional Medicare IM given:   Additional Medicare IM given by:    Discharge Disposition:  HOME/SELF CARE  Per UR Regulation:    If discussed at Long Length of Stay Meetings, dates discussed:    Comments:  09/06/13 1125 Arlyss Queenammy Ilani Otterson, RN BSN CM Pt potential discharge within 48 hours. No CM needs noted.  09/03/2013 1122 Christie NottinghamJessica Childeress, RN, MSN, [employer]

## 2013-09-06 NOTE — Progress Notes (Signed)
[facility] PROGRESS NOTE  [name] KRIENKE MVH:[id number] DOB: 03-18-1945 DOA: 09/02/2013  Brief HPI: 68yo female presented with chest pain. Had cardiac work up including stress test and ECHO as mentioned below. Now mainly here with GI issues.  Past medical history:  Past Medical History  Diagnosis Date  . Cardiomyopathy, ischemic     Low ejection fraction of 19%,but normalized to 55% in 2008.  Marland Kitchen Coronary atherosclerosis of native coronary artery 10/2002    Diffuse moderate  . Ventricular tachycardia, nonsustained   . COPD (chronic obstructive pulmonary disease)   . Essential hypertension, benign   . OSA (obstructive sleep apnea)     CPAP Compliant  . Chronic pain syndrome     Consultants: Cardiology, GI  Procedures:  Cardiac Stress Test: Overall low risk Lexiscan Cardiolite. There were no diagnostic ST segment changes. Perfusion imaging is most consistent with breast attenuation artifact, no clear evidence of scar or ischemia. LVEF calculated at 46% with no focal wall motion abnormalities, and normal volumes.  2D ECHO: Impressions: Upper normal LV wall thickness with LVEF 50-55%, grade 2 diastolic dysfunction. Mild left atrial enlargement. Moderately sclerotic aortic valve. Device wire in right heart. PASP 30 mmHg.  Antibiotics: None  Subjective: Patient feels about same. Continues to have nausea and upper abdominal discomfort. Still no BM.   Objective: Vital Signs  Filed Vitals:   09/05/13 1255 09/05/13 1520 09/05/13 2222 09/06/13 0531  BP: 94/77 95/48 108/62 125/52  Pulse: 82 70 66 69  Temp:  98.6 F (37 C) 97.6 F (36.4 C) 97.8 F (36.6 C)  TempSrc:  Oral Oral Oral  Resp:  17 20 17   Height:      Weight:      SpO2:  94% 95% 95%    Intake/Output Summary (Last 24 hours) at 09/06/13 0816 Last data filed at 09/05/13 1900  Gross per 24 hour  Intake   1310 ml  Output      0 ml  Net   1310 ml   Filed Weights   09/02/13 2101 09/03/13 0046  Weight:  72.122 kg (159 lb) 71.079 kg (156 lb 11.2 oz)    General appearance: alert, cooperative, appears stated age, no distress and moderately obese Resp: clear to auscultation bilaterally Cardio: regular rate and rhythm, S1, S2 normal, no murmur, click, rub or gallop GI: soft, mildly tender in RUQ and epigastrium. No rebound rigidity or guarding. No masses. BS present. Neurologic: No focal deficits  Lab Results:  Basic Metabolic Panel:  Recent Labs Lab 09/02/13 2213 09/04/13 1033 09/05/13 0543 09/06/13 0651  NA 141 139 141 142  K 4.0 4.3 4.8 4.6  CL 99 96 104 106  CO2 32 32 27 29  GLUCOSE 136* 100* 117* 109*  BUN 12 24* 22 17  CREATININE 0.90 1.78* 1.54* 1.07  CALCIUM 9.3 9.2 8.5 8.7   Liver Function Tests:  Recent Labs Lab 09/02/13 2213 09/04/13 1033 09/05/13 0543  AST 32 29 26  ALT 25 23 22   ALKPHOS 57 50 49  BILITOT 0.3 0.3 0.2*  PROT 7.4 7.2 6.8  ALBUMIN 3.8 3.7 3.3*    Recent Labs Lab 09/02/13 2213 09/04/13 1015  LIPASE 27 33   CBC:  Recent Labs Lab 09/02/13 2213 09/04/13 1033 09/05/13 0543 09/06/13 0651  WBC 8.1 8.7 9.8 8.1  NEUTROABS 3.6  --   --   --   HGB 14.3 13.5 12.8 11.9*  HCT 42.5 41.0 39.6 36.7  MCV 89.1 90.5 90.0 90.4  PLT 192 163 180 131*   Cardiac Enzymes:  Recent Labs Lab 09/02/13 2213 09/03/13 0139 09/03/13 0758 09/03/13 1341  TROPONINI <0.30 <0.30 <0.30 <0.30     Studies/Results: Ct Head Wo Contrast  09/05/2013   CLINICAL DATA:  Headache.  Nausea.  No injury.  EXAM: CT HEAD WITHOUT CONTRAST  TECHNIQUE: Contiguous axial images were obtained from the base of the skull through the vertex without intravenous contrast.  [facility]  [facility]  FINDINGS: No mass lesion, mass effect, midline shift, hydrocephalus, hemorrhage. No territorial ischemia or acute infarction. Unchanged low attenuation is present in the periventricular RIGHT parietal lobe in the white matter, compatible with chronic ischemic disease.  Paranasal sinuses appear within normal limits.  IMPRESSION: No acute intracranial abnormality. Mild chronic ischemic white matter disease. No change from prior.   Electronically Signed   By: Andreas NewportGeoffrey  Lamke M.D.   On: 09/05/2013 09:15   [place] Abdomen Complete  09/04/2013   CLINICAL DATA:  Abdominal pain and nausea  EXAM: ULTRASOUND ABDOMEN COMPLETE  COMPARISON:  None.  FINDINGS: Gallbladder:  No gallstones or wall thickening visualized. There is no pericholecystic fluid. No sonographic Murphy sign noted.  Common bile duct:  Diameter: 6 mm. There is no intrahepatic, common hepatic, or common bile duct dilatation.  Liver:  No focal lesion identified. Liver is prominent measuring 15.5 cm in length. Liver echogenicity is diffusely increased.  IVC:  No abnormality visualized.  Pancreas:  Visualized portion unremarkable. The pancreatic tail is incompletely visualized due to overlying gas.  Spleen:  Size and appearance within normal limits.  Right Kidney:  Length: 9.7 cm. Echogenicity within normal limits. No mass or hydronephrosis visualized.  Left Kidney:  Length: 10.0 cm. Echogenicity within normal limits. No mass or hydronephrosis visualized.  Abdominal aorta:  No aneurysm visualized.  Other findings:  No demonstrable ascites.  IMPRESSION: Liver mildly enlarged with diffuse increased echogenicity consistent with fatty change. While no focal liver lesions are identified, it must be cautioned that the sensitivity of ultrasound for focal liver lesions is diminished in this circumstance.  Portions of the pancreatic tailor obscured by gas. Visualized portions of pancreas appear normal.  Study otherwise unremarkable.   Electronically Signed   By: Bretta BangWilliam  Woodruff M.D.   On: 09/04/2013 10:34    Medications:  Scheduled: . antiseptic oral rinse  15 mL Mouth Rinse BID  . carvedilol  18.75 mg Oral BID  . clopidogrel  75 mg Oral Daily  . lisinopril  5 mg Oral Daily  . pantoprazole  40 mg Oral BID AC   Continuous:     ZOX:[id number]:acetaminophen, alprazolam, ondansetron (ZOFRAN) IV, oxyCODONE, sodium chloride  Assessment/Plan:  Principal Problem:   Chest pain Active Problems:   HYPERTENSION, UNSPECIFIED   CORONARY ATHEROSCLEROSIS NATIVE CORONARY ARTERY   CARDIOMYOPATHY, SECONDARY   VENTRICULAR TACHYCARDIA   ICD-Medtronic   Schizoaffective schizophrenia   Nausea with Abdominal Pain/Constipation Etiology remains unclear. GI input appreciated. LFT and Lipase normal. US abdomen did not reveal any GB issues. Continue PRN Antiemetics. Continue PPI. Will consider laxative considering significant constipation but will let GI address this as well since there are plans for endoscopies. GES today. Also reports occasional rectal bleed. None currently. Hgb is stable.  Chest pain/Cardiomyopathy  Atypical chest pain. Stress test was low risk. ECHO also did not show any concerning findings. Cleared by cardiology. She has significant cardiac history, history of cardiomyopathy status post ICD. BP remains stable now. Was hypotensive 7/29 night.   Elevated Creatinine Improved with  IVF. Now at baseline. Continue fluids for now.   Hypertension  Continue home medications.   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   COPD/OSA  Stable. CPCP.   Code Status: Full Code  DVT Prophylaxis: SCD's    Family Communication: Discussed with patient  Disposition Plan: Not ready for discharge.    LOS: 4 days   [facility]  [facility] Pager [phone] 09/06/2013, 8:16 AM  If 8PM-8AM, please contact night-coverage at [email or web address], password [email or web address]   Disclaimer: This note was dictated with voice recognition software. Similar sounding words can inadvertently be transcribed and may not be corrected upon review.

## 2013-09-07 DIAGNOSIS — R110 Nausea: Secondary | ICD-10-CM

## 2013-09-07 DIAGNOSIS — B9681 Helicobacter pylori [H. pylori] as the cause of diseases classified elsewhere: Secondary | ICD-10-CM

## 2013-09-07 DIAGNOSIS — K3184 Gastroparesis: Secondary | ICD-10-CM

## 2013-09-07 DIAGNOSIS — K921 Melena: Secondary | ICD-10-CM

## 2013-09-07 DIAGNOSIS — R1310 Dysphagia, unspecified: Secondary | ICD-10-CM

## 2013-09-07 HISTORY — DX: Helicobacter pylori (H. pylori) as the cause of diseases classified elsewhere: B96.81

## 2013-09-07 LAB — BASIC METABOLIC PANEL
ANION GAP: 8 (ref 5–15)
BUN: 15 mg/dL (ref 6–23)
CHLORIDE: 103 meq/L (ref 96–112)
CO2: 29 meq/L (ref 19–32)
CREATININE: 1.03 mg/dL (ref 0.50–1.10)
Calcium: 8.9 mg/dL (ref 8.4–10.5)
GFR calc Af Amer: 64 mL/min — ABNORMAL LOW (ref >90)
GFR calc non Af Amer: 55 mL/min — ABNORMAL LOW (ref >90)
Glucose, Bld: 120 mg/dL — ABNORMAL HIGH (ref 70–99)
POTASSIUM: 4.3 meq/L (ref 3.7–5.3)
Sodium: 140 mEq/L (ref 137–147)

## 2013-09-07 LAB — MAGNESIUM: MAGNESIUM: 1.7 mg/dL (ref 1.5–2.5)

## 2013-09-07 MED ORDER — FAMOTIDINE 20 MG PO TABS: 20.0000 mg | ORAL_TABLET | Freq: Two times a day (BID) | ORAL | Status: DC

## 2013-09-07 MED ORDER — METOCLOPRAMIDE HCL 5 MG PO TABS: 5.0000 mg | ORAL_TABLET | Freq: Three times a day (TID) | ORAL | Status: DC

## 2013-09-07 MED ORDER — POLYETHYLENE GLYCOL 3350 17 G PO PACK: 17.0000 g | PACK | Freq: Every day | ORAL | Status: DC | PRN

## 2013-09-07 NOTE — Progress Notes (Signed)
Subjective:  Patient feels better. Her nausea is much less. She ate most of her breakfast. She denies abdominal pain or rectal bleeding. She has not noted any side effects with metoclopramide. She denies being nervous.    Objective: Blood pressure 103/60, pulse 80, temperature 98.4 F (36.9 C), temperature source Oral, resp. rate 20, height 5\' 1"  (1.549 m), weight 156 lb 11.2 oz (71.079 kg), SpO2 94.00%. Patient is alert and does not have tremors. Abdomen is soft with mild tenderness in midepigastrium and right lower quadrant. No organomegaly or masses. No LE edema or clubbing noted.  Labs/studies Results:   Recent Labs  09/05/13 0543 09/06/13 0651  WBC 9.8 8.1  HGB 12.8 11.9*  HCT 39.6 36.7  PLT 180 131*    BMET   Recent Labs  09/05/13 0543 09/06/13 0651 09/07/13 0540  NA 141 142 140  K 4.8 4.6 4.3  CL 104 106 103  CO2 27 29 29   GLUCOSE 117* 109* 120*  BUN 22 17 15   CREATININE 1.54* 1.07 1.03  CALCIUM 8.5 8.7 8.9      Assessment:  #1. Gastroparesis. Patient is tolerating low-dose metoclopramide. #2. Intermittent low wall hematochezia. Insignificant drop in H&H. #3. Dysphagia.  Patient stable for discharge from GI standpoint.  Recommendations;  Continue metoclopramide at current dose. EGD and colonoscopy on an outpatient basis under monitored anesthesia care. Dr. Darrick PennaFields will arrange.

## 2013-09-07 NOTE — Progress Notes (Signed)
Patient states understanding of discharge instructions, prescriptions given 

## 2013-09-07 NOTE — Progress Notes (Signed)
Patient had a 25 run of v-tach. The patient was up using the restroom at the time and asymptomatic. Paged the on-call physician, will follow any orders given and continue to monitor the patient.

## 2013-09-07 NOTE — Discharge Instructions (Signed)

## 2013-09-07 NOTE — Discharge Summary (Addendum)
Physician Discharge Summary  [name] ZOX:[id number] DOB: 09-Nov-1945 DOA: 09/02/2013  PCP: Pcp Not In System  Admit date: 09/02/2013 Discharge date: 09/07/2013  Time spent: 40 minutes  Recommendations for Outpatient Follow-up:  1. Followup with gastroenterology as an outpatient for consideration of EGD and colonoscopy under anesthesia  Discharge Diagnoses:  Principal Problem:   Chest pain possibly related to GERD versus element of gastroparesis Active Problems:   HYPERTENSION, UNSPECIFIED   CORONARY ATHEROSCLEROSIS NATIVE CORONARY ARTERY   CARDIOMYOPATHY, SECONDARY   VENTRICULAR TACHYCARDIA   ICD-Medtronic  gastroparesis, possibly related to chronic narcotics. Nausea and vomiting, resolved Acute renal failure due to dehydration  Discharge Condition: Improved  Diet recommendation: Low salt, chopped meats  Filed Weights   09/02/13 2101 09/03/13 0046  Weight: 72.122 kg (159 lb) 71.079 kg (156 lb 11.2 oz)    History of present illness and hospital course:  This patient was initially admitted to the hospital with atypical chest pain. She was seen by cardiology and underwent a stress test that was found to be low risk. Echocardiogram did not show any concerning findings. She was cleared by cardiology but continues to have chest pain, nausea and vomiting. She was seen by gastroenterology and had normal LFT and lipase. Ultrasound of the abdomen did not reveal any gallbladder issues. She did have a gastric emptying study done which was found to be abnormal consistent with gastroparesis. She was started on Reglan with good effect which haven't resolved her symptoms and she is not able to tolerate a solid diet. She's not had any recurrence of her symptoms. Plans are for outpatient EGD/colonoscopy under anesthesia by GI. She also had elevation in creatinine due to dehydration which has resolved with IV fluids. The patient was cleared for discharge home.  Procedures: Cardiac Stress  Test: Overall low risk Lexiscan Cardiolite. There were no diagnostic ST segment changes. Perfusion imaging is most consistent with breast attenuation artifact, no clear evidence of scar or ischemia. LVEF calculated at 46% with no focal wall motion abnormalities, and normal volumes.   2D ECHO: Impressions: Upper normal LV wall thickness with LVEF 50-55%, grade 2 diastolic dysfunction. Mild left atrial enlargement. Moderately sclerotic aortic valve. Device wire in right heart. PASP 30 mmHg.   Consultations:  Gastroenterology  Cardiology  Discharge Exam: Filed Vitals:   09/07/13 0625  BP: 103/60  Pulse: 80  Temp: 98.4 F (36.9 C)  Resp: 20    General: No acute distress Cardiovascular: S1, S2 regular rate and rhythm Respiratory: Clear to auscultation bilaterally  Discharge Instructions You were cared for by a hospitalist during your hospital stay. If you have any questions about your discharge medications or the care you received while you were in the hospital after you are discharged, you can call the unit and asked to speak with the hospitalist on call if the hospitalist that took care of you is not available. Once you are discharged, your primary care physician will handle any further medical issues. Please note that NO REFILLS for any discharge medications will be authorized once you are discharged, as it is imperative that you return to your primary care physician (or establish a relationship with a primary care physician if you do not have one) for your aftercare needs so that they can reassess your need for medications and monitor your lab values.  Discharge Instructions   Call MD for:  persistant nausea and vomiting    Complete by:  As directed      Diet - low  sodium heart healthy    Complete by:  As directed      Increase activity slowly    Complete by:  As directed             Medication List         alprazolam 2 MG tablet  Commonly known as:  XANAX  Take 2 mg by  mouth 3 (three) times daily as needed for sleep or anxiety.     carvedilol 12.5 MG tablet  Commonly known as:  COREG  Take 18.75 mg by mouth 2 (two) times daily. One and one half tablet taken twice dailya     clopidogrel 75 MG tablet  Commonly known as:  PLAVIX  Take 1 tablet (75 mg total) by mouth daily.     famotidine 20 MG tablet  Commonly known as:  PEPCID  Take 1 tablet (20 mg total) by mouth 2 (two) times daily.     fish oil-omega-3 fatty acids 1000 MG capsule  Take 1-2 g by mouth daily.     furosemide 20 MG tablet  Commonly known as:  LASIX  Take 1 tablet (20 mg total) by mouth daily.     LAXATIVE PO  Take 2 tablets by mouth once as needed (for constipation).     lisinopril 5 MG tablet  Commonly known as:  PRINIVIL,ZESTRIL  Take 1 tablet (5 mg total) by mouth daily.     metoCLOPramide 5 MG tablet  Commonly known as:  REGLAN  Take 1 tablet (5 mg total) by mouth 4 (four) times daily -  before meals and at bedtime.     nitroGLYCERIN 0.4 MG SL tablet  Commonly known as:  NITROSTAT  Place 1 tablet (0.4 mg total) under the tongue every 5 (five) minutes as needed for chest pain.     oxyCODONE 15 MG immediate release tablet  Commonly known as:  ROXICODONE  Take 15 mg by mouth 4 (four) times daily.     polyethylene glycol packet  Commonly known as:  MIRALAX / GLYCOLAX  Take 17 g by mouth daily as needed (constipation).       Allergies  Allergen Reactions  . Simvastatin Other (See Comments)    Unknown reaction  . Aspirin Rash  . Penicillins Rash       Follow-up Information   Follow up with Jonette Eva, MD. (Dr. Darrick Penna office will call you for appointment for colonoscopy/EGD)    Specialty:  Gastroenterology   Contact information:   [address] [place] [address] [phone]        The results of significant diagnostics from this hospitalization (including imaging, microbiology, ancillary and laboratory) are listed  below for reference.    Significant Diagnostic Studies: Dg Chest 2 View  09/02/2013   CLINICAL DATA:  Chest pain tonight.  Smoker.  EXAM: CHEST  2 VIEW  COMPARISON:  None.  FINDINGS: Cardiac pacemaker. Normal heart size and pulmonary vascularity. No focal airspace disease or consolidation in the lungs. No blunting of costophrenic angles. No pneumothorax. Calcification of the aorta.  IMPRESSION: No active cardiopulmonary disease.   Electronically Signed   By: Burman Nieves M.D.   On: 09/02/2013 22:48   Ct Head Wo Contrast  09/05/2013   CLINICAL DATA:  Headache.  Nausea.  No injury.  EXAM: CT HEAD WITHOUT CONTRAST  TECHNIQUE: Contiguous axial images were obtained from the base of the skull through the vertex without intravenous contrast.  [facility]  [facility]  FINDINGS: No mass lesion, mass effect, midline shift, hydrocephalus, hemorrhage. No territorial ischemia or acute infarction. Unchanged low attenuation is present in the periventricular RIGHT parietal lobe in the white matter, compatible with chronic ischemic disease. Paranasal sinuses appear within normal limits.  IMPRESSION: No acute intracranial abnormality. Mild chronic ischemic white matter disease. No change from prior.   Electronically Signed   By: Andreas NewportGeoffrey  Lamke M.D.   On: 09/05/2013 09:15   Nm Gastric Emptying  09/06/2013   CLINICAL DATA:  Abdominal pain and nausea.  Bloating.  EXAM: NUCLEAR MEDICINE GASTRIC EMPTYING SCAN  TECHNIQUE: After oral ingestion of radiolabeled meal, sequential abdominal images were obtained for 120 minutes. Residual percentage of activity remaining within the stomach was calculated at 60 and 120 minutes.  RADIOPHARMACEUTICALS:  2.0 mCi Technetium 99-m labeled sulfur colloid  COMPARISON:  None.  FINDINGS: Expected location of the stomach in the left upper quadrant. Ingested meal empties the stomach gradually over the course of the study with 98.3% retention at 60 min and 76.1% retention at  120 min (normal retention less than 30% at a 120 min).  IMPRESSION: Delayed gastric emptying consistent with gastroparesis.   Electronically Signed   By: Loralie ChampagneMark  Gallerani M.D.   On: 09/06/2013 13:14   [place] Abdomen Complete  09/04/2013   CLINICAL DATA:  Abdominal pain and nausea  EXAM: ULTRASOUND ABDOMEN COMPLETE  COMPARISON:  None.  FINDINGS: Gallbladder:  No gallstones or wall thickening visualized. There is no pericholecystic fluid. No sonographic Murphy sign noted.  Common bile duct:  Diameter: 6 mm. There is no intrahepatic, common hepatic, or common bile duct dilatation.  Liver:  No focal lesion identified. Liver is prominent measuring 15.5 cm in length. Liver echogenicity is diffusely increased.  IVC:  No abnormality visualized.  Pancreas:  Visualized portion unremarkable. The pancreatic tail is incompletely visualized due to overlying gas.  Spleen:  Size and appearance within normal limits.  Right Kidney:  Length: 9.7 cm. Echogenicity within normal limits. No mass or hydronephrosis visualized.  Left Kidney:  Length: 10.0 cm. Echogenicity within normal limits. No mass or hydronephrosis visualized.  Abdominal aorta:  No aneurysm visualized.  Other findings:  No demonstrable ascites.  IMPRESSION: Liver mildly enlarged with diffuse increased echogenicity consistent with fatty change. While no focal liver lesions are identified, it must be cautioned that the sensitivity of ultrasound for focal liver lesions is diminished in this circumstance.  Portions of the pancreatic tailor obscured by gas. Visualized portions of pancreas appear normal.  Study otherwise unremarkable.   Electronically Signed   By: Bretta BangWilliam  Woodruff M.D.   On: 09/04/2013 10:34   Nm Myocar Single W/spect W/wall Motion And Ef  09/03/2013   CLINICAL DATA:  68 year old woman with history of CAD, ischemic cardiomyopathy with subsequent normalization of LVEF, prior ICD placement, COPD, and hypertension, now referred with atypical chest pain for the  assessment of ischemia.  EXAM: MYOCARDIAL IMAGING WITH SPECT (REST AND PHARMACOLOGIC-STRESS)  GATED LEFT VENTRICULAR WALL MOTION STUDY  LEFT VENTRICULAR EJECTION FRACTION  TECHNIQUE: Standard myocardial SPECT imaging was performed after resting intravenous injection of 10 mCi Tc-1520m sestamibi. Subsequently, intravenous infusion of Lexiscan was performed under the supervision of the Cardiology staff. At peak effect of the drug, 30 mCi Tc-5920m sestamibi was injected intravenously and standard myocardial SPECT imaging was performed. Quantitative gated imaging was also performed to evaluate left ventricular wall motion, and estimate left ventricular ejection fraction.  FINDINGS: Baseline tracing shows sinus rhythm at 68 beats per  min with poor R wave progression and nonspecific ST T changes. Lexiscan bolus was given in standard fashion. Heart rate increased from 66 beats per min up to 91 beats per min, and blood pressure increased from 107/69 up to 134/75. No chest pain was reported. No diagnostic ST segment changes were noted over baseline, there were rare PVCs.  Analysis of the overall perfusion data shows adequate radiotracer uptake within the myocardium, some breast attenuation as well.  Tomographic views were obtained using the short axis, vertical long axis, and horizontal long axis planes. There is a small, mild intensity, anteroapical defect that is consistent with soft tissue attenuation, fixed. No large reversible defects are noted to indicate ischemia.  Gated imaging reveals an EDV of 63, a ESV of 34, LVEF of 46%, and TID ratio of 1.23. No focal wall motion abnormalities noted.  IMPRESSION: Overall low risk Lexiscan Cardiolite. There were no diagnostic ST segment changes. Perfusion imaging is most consistent with breast attenuation artifact, no clear evidence of scar or ischemia. LVEF calculated at 46% with no focal wall motion abnormalities, and normal volumes.   Electronically Signed   By: Nona Dell  M.D.   On: 09/03/2013 13:52    Microbiology: No results found for this or any previous visit (from the past 240 hour(s)).   Labs: Basic Metabolic Panel:  Recent Labs Lab 09/02/13 2213 09/04/13 1033 09/05/13 0543 09/06/13 0651 09/07/13 0526 09/07/13 0540  NA 141 139 141 142  --  140  K 4.0 4.3 4.8 4.6  --  4.3  CL 99 96 104 106  --  103  CO2 32 32 27 29  --  29  GLUCOSE 136* 100* 117* 109*  --  120*  BUN 12 24* 22 17  --  15  CREATININE 0.90 1.78* 1.54* 1.07  --  1.03  CALCIUM 9.3 9.2 8.5 8.7  --  8.9  MG  --   --   --   --  1.7  --    Liver Function Tests:  Recent Labs Lab 09/02/13 2213 09/04/13 1033 09/05/13 0543  AST 32 29 26  ALT 25 23 22   ALKPHOS 57 50 49  BILITOT 0.3 0.3 0.2*  PROT 7.4 7.2 6.8  ALBUMIN 3.8 3.7 3.3*    Recent Labs Lab 09/02/13 2213 09/04/13 1015  LIPASE 27 33   No results found for this basename: AMMONIA,  in the last 168 hours CBC:  Recent Labs Lab 09/02/13 2213 09/04/13 1033 09/05/13 0543 09/06/13 0651  WBC 8.1 8.7 9.8 8.1  NEUTROABS 3.6  --   --   --   HGB 14.3 13.5 12.8 11.9*  HCT 42.5 41.0 39.6 36.7  MCV 89.1 90.5 90.0 90.4  PLT 192 163 180 131*   Cardiac Enzymes:  Recent Labs Lab 09/02/13 2213 09/03/13 0139 09/03/13 0758 09/03/13 1341  TROPONINI <0.30 <0.30 <0.30 <0.30   BNP: BNP (last 3 results) No results found for this basename: PROBNP,  in the last 8760 hours CBG: No results found for this basename: GLUCAP,  in the last 168 hours     Signed:  MEMON,JEHANZEB  [facility] 09/07/2013, 8:33 PM

## 2013-09-09 NOTE — Progress Notes (Signed)
UR chart review completed.  

## 2013-09-09 NOTE — Telephone Encounter (Signed)
LMOM to call.

## 2013-09-11 ENCOUNTER — Telehealth: Payer: Self-pay | Admitting: Gastroenterology

## 2013-09-11 ENCOUNTER — Emergency Department (HOSPITAL_COMMUNITY)
Admission: EM | Admit: 2013-09-11 | Discharge: 2013-09-11 | Disposition: A | Discharge status: Home / Self Care | Payer: Medicare Other | Attending: Emergency Medicine | Admitting: Emergency Medicine

## 2013-09-11 ENCOUNTER — Encounter (HOSPITAL_COMMUNITY): Payer: Self-pay | Admitting: Emergency Medicine

## 2013-09-11 ENCOUNTER — Emergency Department (HOSPITAL_COMMUNITY): Payer: Medicare Other

## 2013-09-11 ENCOUNTER — Other Ambulatory Visit: Payer: Self-pay

## 2013-09-11 DIAGNOSIS — R109 Unspecified abdominal pain: Secondary | ICD-10-CM | POA: Insufficient documentation

## 2013-09-11 DIAGNOSIS — G894 Chronic pain syndrome: Secondary | ICD-10-CM | POA: Diagnosis not present

## 2013-09-11 DIAGNOSIS — G4733 Obstructive sleep apnea (adult) (pediatric): Secondary | ICD-10-CM | POA: Insufficient documentation

## 2013-09-11 DIAGNOSIS — J4489 Other specified chronic obstructive pulmonary disease: Secondary | ICD-10-CM | POA: Insufficient documentation

## 2013-09-11 DIAGNOSIS — R1013 Epigastric pain: Secondary | ICD-10-CM | POA: Insufficient documentation

## 2013-09-11 DIAGNOSIS — Z79899 Other long term (current) drug therapy: Secondary | ICD-10-CM | POA: Diagnosis not present

## 2013-09-11 DIAGNOSIS — Z7902 Long term (current) use of antithrombotics/antiplatelets: Secondary | ICD-10-CM | POA: Diagnosis not present

## 2013-09-11 DIAGNOSIS — I2510 Atherosclerotic heart disease of native coronary artery without angina pectoris: Secondary | ICD-10-CM | POA: Insufficient documentation

## 2013-09-11 DIAGNOSIS — Z880 Allergy status to penicillin: Secondary | ICD-10-CM | POA: Diagnosis not present

## 2013-09-11 DIAGNOSIS — J449 Chronic obstructive pulmonary disease, unspecified: Secondary | ICD-10-CM | POA: Insufficient documentation

## 2013-09-11 DIAGNOSIS — Z9981 Dependence on supplemental oxygen: Secondary | ICD-10-CM | POA: Insufficient documentation

## 2013-09-11 DIAGNOSIS — Z87891 Personal history of nicotine dependence: Secondary | ICD-10-CM | POA: Diagnosis not present

## 2013-09-11 DIAGNOSIS — Z95810 Presence of automatic (implantable) cardiac defibrillator: Secondary | ICD-10-CM | POA: Insufficient documentation

## 2013-09-11 DIAGNOSIS — I10 Essential (primary) hypertension: Secondary | ICD-10-CM | POA: Insufficient documentation

## 2013-09-11 LAB — URINALYSIS, ROUTINE W REFLEX MICROSCOPIC
BILIRUBIN URINE: NEGATIVE
Glucose, UA: NEGATIVE mg/dL
HGB URINE DIPSTICK: NEGATIVE
KETONES UR: NEGATIVE mg/dL
Leukocytes, UA: NEGATIVE
NITRITE: NEGATIVE
PROTEIN: NEGATIVE mg/dL
Specific Gravity, Urine: 1.025 (ref 1.005–1.030)
UROBILINOGEN UA: 0.2 mg/dL (ref 0.0–1.0)
pH: 6 (ref 5.0–8.0)

## 2013-09-11 LAB — CBC WITH DIFFERENTIAL/PLATELET
BASOS PCT: 0 % (ref 0–1)
Basophils Absolute: 0 10*3/uL (ref 0.0–0.1)
EOS ABS: 0.4 10*3/uL (ref 0.0–0.7)
Eosinophils Relative: 5 % (ref 0–5)
HCT: 39.7 % (ref 36.0–46.0)
HEMOGLOBIN: 13.1 g/dL (ref 12.0–15.0)
Lymphocytes Relative: 44 % (ref 12–46)
Lymphs Abs: 3.7 10*3/uL (ref 0.7–4.0)
MCH: 29.8 pg (ref 26.0–34.0)
MCHC: 33 g/dL (ref 30.0–36.0)
MCV: 90.4 fL (ref 78.0–100.0)
MONO ABS: 0.7 10*3/uL (ref 0.1–1.0)
MONOS PCT: 9 % (ref 3–12)
NEUTROS ABS: 3.5 10*3/uL (ref 1.7–7.7)
NEUTROS PCT: 42 % — AB (ref 43–77)
Platelets: 206 10*3/uL (ref 150–400)
RBC: 4.39 MIL/uL (ref 3.87–5.11)
RDW: 12.7 % (ref 11.5–15.5)
WBC: 8.3 10*3/uL (ref 4.0–10.5)

## 2013-09-11 LAB — COMPREHENSIVE METABOLIC PANEL
ALK PHOS: 51 U/L (ref 39–117)
ALT: 22 U/L (ref 0–35)
ANION GAP: 12 (ref 5–15)
AST: 31 U/L (ref 0–37)
Albumin: 3.8 g/dL (ref 3.5–5.2)
BILIRUBIN TOTAL: 0.3 mg/dL (ref 0.3–1.2)
BUN: 18 mg/dL (ref 6–23)
CHLORIDE: 99 meq/L (ref 96–112)
CO2: 31 mEq/L (ref 19–32)
CREATININE: 1.11 mg/dL — AB (ref 0.50–1.10)
Calcium: 9.1 mg/dL (ref 8.4–10.5)
GFR calc non Af Amer: 50 mL/min — ABNORMAL LOW (ref >90)
GFR, EST AFRICAN AMERICAN: 58 mL/min — AB (ref >90)
GLUCOSE: 84 mg/dL (ref 70–99)
POTASSIUM: 4 meq/L (ref 3.7–5.3)
Sodium: 142 mEq/L (ref 137–147)
Total Protein: 7.4 g/dL (ref 6.0–8.3)

## 2013-09-11 LAB — ETHANOL: Alcohol, Ethyl (B): <11 mg/dL (ref 0–11)

## 2013-09-11 LAB — LIPASE, BLOOD: LIPASE: 30 U/L (ref 11–59)

## 2013-09-11 MED ORDER — IOHEXOL 300 MG/ML  SOLN
100.0000 mL | Freq: Once | INTRAMUSCULAR | Status: AC | PRN
  Administered 2013-09-11: 100 mL via INTRAVENOUS

## 2013-09-11 MED ORDER — HYDROMORPHONE HCL PF 1 MG/ML IJ SOLN
1.0000 mg | Freq: Once | INTRAMUSCULAR | Status: AC
  Administered 2013-09-11: 1 mg via INTRAVENOUS
  Filled 2013-09-11: qty 1

## 2013-09-11 MED ORDER — SODIUM CHLORIDE 0.9 % IJ SOLN
INTRAMUSCULAR | Status: AC
  Filled 2013-09-11: qty 30

## 2013-09-11 MED ORDER — SODIUM CHLORIDE 0.9 % IV SOLN
INTRAVENOUS | Status: DC
  Administered 2013-09-11: 20:00:00 via INTRAVENOUS

## 2013-09-11 MED ORDER — GI COCKTAIL ~~LOC~~
30.0000 mL | Freq: Once | ORAL | Status: AC
  Administered 2013-09-11: 30 mL via ORAL
  Filled 2013-09-11: qty 30

## 2013-09-11 MED ORDER — IOHEXOL 300 MG/ML  SOLN
50.0000 mL | Freq: Once | INTRAMUSCULAR | Status: AC | PRN
  Administered 2013-09-11: 50 mL via ORAL

## 2013-09-11 MED ORDER — ONDANSETRON HCL 4 MG/2ML IJ SOLN
4.0000 mg | Freq: Once | INTRAMUSCULAR | Status: AC
  Administered 2013-09-11: 4 mg via INTRAVENOUS
  Filled 2013-09-11: qty 2

## 2013-09-11 NOTE — Telephone Encounter (Signed)
See phone note of 09/06/2013. I have LMOM for a return call.

## 2013-09-11 NOTE — ED Notes (Signed)
Upper abdominal pain began 3 hours PTA. Pt states "feels like food is just sitting there" Bulging noted to upper abdomen. Nausea also.  Pt was d/c from [facility] 8/1 for abdominal pain

## 2013-09-11 NOTE — Telephone Encounter (Signed)
[name] KocherLeslie S Lewis, PA-C at 09/06/2013 9:04 AM     Status: Signed        Please schedule TCS/EGD/ED with [facility] in OR (polypharmacy). UJ:WJXBJ,YNx:brbpr,wt loss, nausea, esophageal dysphagia, abd pain. Patient likely to go home from hospital within 24 hours. Please triage for procedure

## 2013-09-11 NOTE — Telephone Encounter (Signed)
Pt called and I could not understand her, then I spoke to [place]fax[place]. He said that pt has been in a lot of abdominal pain and bloating. Said she said her pain is at an 8 now. I told him if she was hurting that bad to go to the ED.  He said they were told at discharge that pt would follow up with Dr. Darrick PennaFields in a couple of days.  I had called and left message to schedule the TCS/EGD/ED and Misty StanleyLisa, pt's daughter, called back today and I scheduled that.  I told him I will let Dr. Darrick PennaFields know, but if pt is hurting that bad she would need to go to the ED.   Sending Dr. Darrick PennaFields page message since she has left for the day.
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
## 2013-09-11 NOTE — Telephone Encounter (Signed)
Per [name], Pre-op appt on 09/24/2013 at 12:45 PM.

## 2013-09-11 NOTE — Telephone Encounter (Signed)
Dr. Darrick PennaFields had left for the day, but she called me and told me to let pt know she should go to the ED if she is hurting that bad. I called and spoke to Minerva AreolaEric and he said that pt is getting a bath now so she can go to the ED.

## 2013-09-11 NOTE — Discharge Instructions (Signed)
Please call Dr. Darrick PennaFields in morning for follow up.   Abdominal Pain, Women Abdominal (stomach, pelvic, or belly) pain can be caused by many things. It is important to tell your doctor:  The location of the pain.  Does it come and go or is it present all the time?  Are there things that start the pain (eating certain foods, exercise)?  Are there other symptoms associated with the pain (fever, nausea, vomiting, diarrhea)? All of this is helpful to know when trying to find the cause of the pain. CAUSES   Stomach: virus or bacteria infection, or ulcer.  Intestine: appendicitis (inflamed appendix), regional ileitis (Crohn's disease), ulcerative colitis (inflamed colon), irritable bowel syndrome, diverticulitis (inflamed diverticulum of the colon), or cancer of the stomach or intestine.  Gallbladder disease or stones in the gallbladder.  Kidney disease, kidney stones, or infection.  Pancreas infection or cancer.  Fibromyalgia (pain disorder).  Diseases of the female organs:  Uterus: fibroid (non-cancerous) tumors or infection.  Fallopian tubes: infection or tubal pregnancy.  Ovary: cysts or tumors.  Pelvic adhesions (scar tissue).  Endometriosis (uterus lining tissue growing in the pelvis and on the pelvic organs).  Pelvic congestion syndrome (female organs filling up with blood just before the menstrual period).  Pain with the menstrual period.  Pain with ovulation (producing an egg).  Pain with an IUD (intrauterine device, birth control) in the uterus.  Cancer of the female organs.  Functional pain (pain not caused by a disease, may improve without treatment).  Psychological pain.  Depression. DIAGNOSIS  Your doctor will decide the seriousness of your pain by doing an examination.  Blood tests.  X-rays.  Ultrasound.  CT scan (computed tomography, special type of X-Kemisha Bonnette).  MRI (magnetic resonance imaging).  Cultures, for infection.  Barium enema (dye  inserted in the large intestine, to better view it with X-rays).  Colonoscopy (looking in intestine with a lighted tube).  Laparoscopy (minor surgery, looking in abdomen with a lighted tube).  Major abdominal exploratory surgery (looking in abdomen with a large incision). TREATMENT  The treatment will depend on the cause of the pain.   Many cases can be observed and treated at home.  Over-the-counter medicines recommended by your caregiver.  Prescription medicine.  Antibiotics, for infection.  Birth control pills, for painful periods or for ovulation pain.  Hormone treatment, for endometriosis.  Nerve blocking injections.  Physical therapy.  Antidepressants.  Counseling with a psychologist or psychiatrist.  Minor or major surgery. HOME CARE INSTRUCTIONS   Do not take laxatives, unless directed by your caregiver.  Take over-the-counter pain medicine only if ordered by your caregiver. Do not take aspirin because it can cause an upset stomach or bleeding.  Try a clear liquid diet (broth or water) as ordered by your caregiver. Slowly move to a bland diet, as tolerated, if the pain is related to the stomach or intestine.  Have a thermometer and take your temperature several times a day, and record it.  Bed rest and sleep, if it helps the pain.  Avoid sexual intercourse, if it causes pain.  Avoid stressful situations.  Keep your follow-up appointments and tests, as your caregiver orders.  If the pain does not go away with medicine or surgery, you may try:  Acupuncture.  Relaxation exercises (yoga, meditation).  Group therapy.  Counseling. SEEK MEDICAL CARE IF:   You notice certain foods cause stomach pain.  Your home care treatment is not helping your pain.  You need stronger pain medicine.  You want your IUD removed.  You feel faint or lightheaded.  You develop nausea and vomiting.  You develop a rash.  You are having side effects or an allergy to  your medicine. SEEK IMMEDIATE MEDICAL CARE IF:   Your pain does not go away or gets worse.  You have a fever.  Your pain is felt only in portions of the abdomen. The right side could possibly be appendicitis. The left lower portion of the abdomen could be colitis or diverticulitis.  You are passing blood in your stools (bright red or black tarry stools, with or without vomiting).  You have blood in your urine.  You develop chills, with or without a fever.  You pass out. MAKE SURE YOU:   Understand these instructions.  Will watch your condition.  Will get help right away if you are not doing well or get worse. Document Released: 11/21/2006 Document Revised: 06/10/2013 Document Reviewed: 12/11/2008 [facility] Patient Information 2015 [place], [place]. This information is not intended to replace advice given to you by your health care provider. Make sure you discuss any questions you have with your health care provider.

## 2013-09-11 NOTE — Telephone Encounter (Signed)
Pt's daughter called, Jamesetta SoLisa Haley, and gave me info to schedule her mom. She is scheduled for 10/01/2013 at 7:30 AM with Dr. Darrick PennaFields in OR.

## 2013-09-11 NOTE — Telephone Encounter (Signed)
Gastroenterology Pre-Procedure Review  Request Date: 09/11/2013 Requesting Physician: Dr. Darrick PennaFields  PATIENT REVIEW QUESTIONS: The patient responded to the following health history questions as indicated:    1. Diabetes Melitis: no 2. Joint replacements in the past 12 months: no 3. Major health problems in the past 3 months: no 4. Has an artificial valve or MVP: no 5. Has a defibrillator: no 6. Has been advised in past to take antibiotics in advance of a procedure like teeth cleaning: no    MEDICATIONS & ALLERGIES:    Patient reports the following regarding taking any blood thinners:   Plavix? no Aspirin? no Coumadin? no  Patient confirms/reports the following medications:  Current Outpatient Prescriptions  Medication Sig Dispense Refill  . alprazolam (XANAX) 2 MG tablet Take 2 mg by mouth 3 (three) times daily as needed for sleep or anxiety.       . Bisacodyl (LAXATIVE PO) Take 2 tablets by mouth once as needed (for constipation).      . carvedilol (COREG) 12.5 MG tablet Take 18.75 mg by mouth 2 (two) times daily. One and one half tablet taken twice dailya      . clopidogrel (PLAVIX) 75 MG tablet Take 1 tablet (75 mg total) by mouth daily.  30 tablet  6  . famotidine (PEPCID) 20 MG tablet Take 1 tablet (20 mg total) by mouth 2 (two) times daily.  60 tablet  0  . fish oil-omega-3 fatty acids 1000 MG capsule Take 1-2 g by mouth daily.       . furosemide (LASIX) 20 MG tablet Take 1 tablet (20 mg total) by mouth daily.  60 tablet  6  . metoCLOPramide (REGLAN) 5 MG tablet Take 1 tablet (5 mg total) by mouth 4 (four) times daily -  before meals and at bedtime.  90 tablet  0  . oxyCODONE (ROXICODONE) 15 MG immediate release tablet Take 15 mg by mouth 4 (four) times daily.      . polyethylene glycol (MIRALAX / GLYCOLAX) packet Take 17 g by mouth daily as needed (constipation).  14 each  0  . lisinopril (PRINIVIL,ZESTRIL) 5 MG tablet Take 1 tablet (5 mg total) by mouth daily.  30 tablet  6  .  nitroGLYCERIN (NITROSTAT) 0.4 MG SL tablet Place 1 tablet (0.4 mg total) under the tongue every 5 (five) minutes as needed for chest pain.  25 tablet  3   No current facility-administered medications for this visit.    Patient confirms/reports the following allergies:  Allergies  Allergen Reactions  . Simvastatin Other (See Comments)    Unknown reaction  . Aspirin Rash  . Penicillins Rash    No orders of the defined types were placed in this encounter.    AUTHORIZATION INFORMATION Primary Insurance:   ID #:   Group #:  Pre-Cert / Auth required:  Pre-Cert / Auth #:   Secondary Insurance:   ID #:  Group #:  Pre-Cert / Auth required:  Pre-Cert / Auth #:   SCHEDULE INFORMATION: Procedure has been scheduled as follows:  Date: 10/01/2013                    Time:  7:30 AM Location: [facility] Short Stay  This Gastroenterology Pre-Precedure Review Form is being routed to the following provider(s): Jonette EvaSandi Fields, MD

## 2013-09-11 NOTE — Telephone Encounter (Addendum)
PT NEEDS EGD/ED/TCS.  MOVI PREP SPLIT DOSING, FULL LIQUIDS WITH BREAKFAST.   Full Liquid Diet A high-calorie, high-protein supplement should be used to meet your nutritional requirements when the full liquid diet is continued for more than 2 or 3 days. If this diet is to be used for an extended period of time (more than 7 days), a multivitamin should be considered.  Breads and Starches  Allowed: None are allowed except crackers WHOLE OR pureed (made into a thick, smooth soup) in soup. Cooked, refined corn, oat, rice, rye, and wheat cereals FOR BREAKFAST ONLY are allowed.   Avoid: Any others.    Potatoes/Pasta/Rice  Allowed: ANY ITEM AS A SOUP OR SMALL PLATE OF MASHED POTATOES OR RICE.       Vegetables  Allowed: Strained tomato or vegetable juice. Vegetables pureed in soup.   Avoid: Any others.    Fruit  Allowed: Any strained fruit juices and fruit drinks. Include 1 serving of citrus or vitamin C-enriched fruit juice daily.   Avoid: Any others.  Meat and Meat Substitutes  Allowed: Egg  Avoid: Any meat, fish, or fowl. All cheese.  Milk  Allowed: Milk beverages, including milk shakes and instant breakfast mixes. Smooth yogurt.   Avoid: Any others. Avoid dairy products if not tolerated.    Soups and Combination Foods  Allowed: Broth, strained cream soups. Strained, broth-based soups.   Avoid: Any others.    Desserts and Sweets  Allowed: flavored gelatin, tapioca, plain ice cream, sherbet, smooth pudding, junket, fruit ices, frozen ice pops, pudding pops,, frozen fudge pops, chocolate syrup. Sugar, honey, jelly, syrup.   Avoid: Any others.  Fats and Oils  Allowed: Margarine, butter, cream, sour cream, oils.   Avoid: Any others.  Beverages  Allowed: All.   Avoid: None.  Condiments  Allowed: Iodized salt, pepper, spices, flavorings. Cocoa powder.   Avoid: Any others.    SAMPLE MEAL PLAN Breakfast   cup orange juice.   1 cup cooked wheat cereal.    1 cup  milk.   1 cup beverage (coffee or tea).   Cream or sugar, if desired.    Midmorning Snack  2 SCRAMBLED OR HARD BOILED EGG   Lunch  1 cup cream soup.    cup fruit juice.   1 cup milk.    cup custard.   1 cup beverage (coffee or tea).   Cream or sugar, if desired.    Midafternoon Snack  1 cup milk shake.  Dinner  1 cup cream soup.    cup fruit juice.   1 cup milk.    cup pudding.   1 cup beverage (coffee or tea).   Cream or sugar, if desired.  Evening Snack  1 cup supplement.  To increase calories, add sugar, cream, butter, or margarine if possible. Nutritional supplements will also increase the total calories.

## 2013-09-13 ENCOUNTER — Other Ambulatory Visit: Payer: Self-pay | Admitting: *Deleted

## 2013-09-13 ENCOUNTER — Telehealth: Payer: Self-pay | Admitting: *Deleted

## 2013-09-13 DIAGNOSIS — E785 Hyperlipidemia, unspecified: Secondary | ICD-10-CM

## 2013-09-13 DIAGNOSIS — Z79899 Other long term (current) drug therapy: Secondary | ICD-10-CM

## 2013-09-13 NOTE — Telephone Encounter (Signed)
Follow up - patient last seen 05/15/2013.  At that visit, Dr. Purvis SheffieldKoneswaran requested her do FLP, LFT.  Discussed with patient & she has not done yet.  She did have recent CMET on 09/11/2013 (see EPIC).  Will ask her to do Lipids though as this was not done.  Will fax [facility] to [facility] & she will go next week.

## 2013-09-13 NOTE — Telephone Encounter (Signed)
REVIEWED. AGREE. 

## 2013-09-16 MED ORDER — PEG-KCL-NACL-NASULF-NA ASC-C 100 G PO SOLR: 1.0000 | ORAL | Status: DC

## 2013-09-16 NOTE — Telephone Encounter (Signed)
I called Jana HalfMarcia Hayes , [employer] rep, @ [phone]. Requested rep for this pt on 10/01/2013 at  [facility] for the defibrillator.  The pt is scheduled for TCS/ EGD at 7:30 and I asked her to be there one hour prior and she said they will be there at 7:00 AM.

## 2013-09-16 NOTE — Telephone Encounter (Signed)
Rx sent to the pharmacy and instructions mailed to pt.  

## 2013-09-16 NOTE — Addendum Note (Signed)
Addended by: Lavena BullionSTEWART, Raed Schalk H on: 09/16/2013 10:08 AM   Modules accepted: Orders

## 2013-09-18 NOTE — Telephone Encounter (Signed)
REVIEWED.  

## 2013-09-19 ENCOUNTER — Encounter (HOSPITAL_COMMUNITY): Payer: Self-pay | Admitting: Pharmacy Technician

## 2013-09-23 ENCOUNTER — Other Ambulatory Visit: Payer: Self-pay

## 2013-09-24 ENCOUNTER — Telehealth: Payer: Self-pay | Admitting: Gastroenterology

## 2013-09-24 ENCOUNTER — Encounter (HOSPITAL_COMMUNITY): Payer: Self-pay

## 2013-09-24 ENCOUNTER — Encounter (HOSPITAL_COMMUNITY)
Admission: RE | Admit: 2013-09-24 | Discharge: 2013-09-24 | Disposition: A | Discharge status: Home / Self Care | Payer: Medicare Other | Source: Ambulatory Visit | Attending: Gastroenterology | Admitting: Gastroenterology

## 2013-09-24 DIAGNOSIS — Z01818 Encounter for other preprocedural examination: Secondary | ICD-10-CM | POA: Diagnosis not present

## 2013-09-24 DIAGNOSIS — R1310 Dysphagia, unspecified: Secondary | ICD-10-CM | POA: Diagnosis not present

## 2013-09-24 NOTE — Telephone Encounter (Signed)
Cliffton AstersPam Shreve, RN at Short Stay called for DS. I told her that DS had just left for lunch. She tells me that patient has defibrillator and will need a rep to come the day of procedure. Also, she said that patient is on Plavix and needs to know when to hold her medicine prior to procedure. I told Pam that I would have to take a note and send to DS. She said that was fine and to let DS know that she needed to update the triage note.

## 2013-09-24 NOTE — Patient Instructions (Signed)
[name]  09/24/2013   Your procedure is scheduled on:  10/01/2013  Report to [facility] at  615  AM.  Call this number if you have problems the morning of surgery: [phone]   Remember:   Do not eat food or drink liquids after midnight.   Take these medicines the morning of surgery with A SIP OF WATER:  Xanax, coreg, pepcid, lisinopril, oxycodone   Do not wear jewelry, make-up or nail polish.  Do not wear lotions, powders, or perfumes.   Do not shave 48 hours prior to surgery. Men may shave face and neck.  Do not bring valuables to the hospital.  [facility] belongings or valuables.               Contacts, dentures or bridgework may not be worn into surgery.  Leave suitcase in the car. After surgery it may be brought to your room.  For patients admitted to the hospital, discharge time is determined by your  treatment team.               Patients discharged the day of surgery will not be allowed to drive home.  Name and phone number of your driver: family  Special Instructions: N/A   Please read over the following fact sheets that you were given: Pain Booklet, Coughing and Deep Breathing, Surgical Site Infection Prevention, Anesthesia Post-op Instructions and Care and Recovery After Surgery Colonoscopy A colonoscopy is an exam to look at the entire large intestine (colon). This exam can help find problems such as tumors, polyps, inflammation, and areas of bleeding. The exam takes about 1 hour.  LET [facility] PROVIDER KNOW ABOUT:   Any allergies you have.  All medicines you are taking, including vitamins, herbs, eye drops, creams, and over-the-counter medicines.  Previous problems you or members of your family have had with the use of anesthetics.  Any blood disorders you have.  Previous surgeries you have had.  Medical conditions you have. RISKS AND COMPLICATIONS  Generally, this is a safe procedure. However, as  with any procedure, complications can occur. Possible complications include:  Bleeding.  Tearing or rupture of the colon wall.  Reaction to medicines given during the exam.  Infection (rare). BEFORE THE PROCEDURE   Ask your health care provider about changing or stopping your regular medicines.  You may be prescribed an oral bowel prep. This involves drinking a large amount of medicated liquid, starting the day before your procedure. The liquid will cause you to have multiple loose stools until your stool is almost clear or light green. This cleans out your colon in preparation for the procedure.  Do not eat or drink anything else once you have started the bowel prep, unless your health care provider tells you it is safe to do so.  Arrange for someone to drive you home after the procedure. PROCEDURE   You will be given medicine to help you relax (sedative).  You will lie on your side with your knees bent.  A long, flexible tube with a light and camera on the end (colonoscope) will be inserted through the rectum and into the colon. The camera sends video back to a computer screen as it moves through the colon. The colonoscope also releases carbon dioxide gas to inflate the colon. This helps your health care provider see the area better.  During the exam, your health care provider  may take a small tissue sample (biopsy) to be examined under a microscope if any abnormalities are found.  The exam is finished when the entire colon has been viewed. AFTER THE PROCEDURE   Do not drive for 24 hours after the exam.  You may have a small amount of blood in your stool.  You may pass moderate amounts of gas and have mild abdominal cramping or bloating. This is caused by the gas used to inflate your colon during the exam.  Ask when your test results will be ready and how you will get your results. Make sure you get your test results. Document Released: 01/22/2000 Document Revised: 11/14/2012  Document Reviewed: 10/01/2012 [facility] Patient Information 2015 [place], [place]. This information is not intended to replace advice given to you by your health care provider. Make sure you discuss any questions you have with your health care provider. Esophageal Dilatation The esophagus is the long, narrow tube which carries food and liquid from the mouth to the stomach. Esophageal dilatation is the technique used to stretch a blocked or narrowed portion of the esophagus. This procedure is used when a part of the esophagus has become so narrow that it becomes difficult, painful or even impossible to swallow. This is generally an uncomplicated form of treatment. When this is not successful, chest surgery may be required. This is a much more extensive form of treatment with a longer recovery time. CAUSES  Some of the more common causes of blockage or strictures of the esophagus are:  Narrowing from longstanding inflammation (soreness and redness) of the lower esophagus. This comes from the constant exposure of the lower esophagus to the acid which bubbles up from the stomach. Over time this causes scarring and narrowing of the lower esophagus.  Hiatal hernia in which a small part of the stomach bulges (herniates) up through the diaphragm. This can cause a gradual narrowing of the end of the esophagus.  Schatzki ring is a narrow ring of benign (non-cancerous) fibrous tissue which constricts the lower esophagus. The reason for this is not known.  Scleroderma is a connective tissue disorder that affects the esophagus and makes swallowing difficult.  Achalasia is an absence of nerves to the lower esophagus and to the esophageal sphincter. This is the circular muscle between the stomach and esophagus that relaxes to allow food into the stomach. After swallowing, it contracts to keep food in the stomach. This absence of nerves may be congenital (present since birth). This can cause irregular spasms of the  lower esophageal muscle. This spasm does not open up to allow food and fluid through. The result is a persistent blockage with subsequent slow trickling of the esophageal contents into the stomach.  Strictures may develop from swallowing materials which damage the esophagus. Some examples are strong acids or alkalis such as lye.  Growths such as benign (non-cancerous) and malignant (cancerous) tumors can block the esophagus.  Hereditary (present since birth) causes. DIAGNOSIS  Your caregiver often suspects this problem by taking a medical history. They will also do a physical exam. They can then prove their suspicions using X-rays and endoscopy. Endoscopy is an exam in which a tube like a small, flexible telescope is used to look at your esophagus.  TREATMENT There are different stretching (dilating) techniques that can be used. Simple bougie dilatation may be done in the office. This usually takes only a couple minutes. A numbing (anesthetic) spray of the throat is used. Endoscopy, when done, is done in an  endoscopy suite under mild sedation. When fluoroscopy is used, the procedure is performed in X-ray. Other techniques require a little longer time. Recovery is usually quick. There is no waiting time to begin eating and drinking to test success of the treatment. Following are some of the methods used. Narrowing of the esophagus is treated by making it bigger. Commonly this is a mechanical problem which can be treated with stretching. This can be done in different ways. Your caregiver will discuss these with you. Some of the means used are:  A series of graduated (increasing thickness) flexible dilators can be used. These are weighted tubes passed through the esophagus into the stomach. The tubes used become progressively larger until the desired stretched size is reached. Graduated dilators are a simple and quick way of opening the esophagus. No visualization is required.  Another method is the use  of endoscopy to place a flexible wire across the stricture. The endoscope is removed and the wire left in place. A dilator with a hole through it from end to end is guided down the esophagus and across the stricture. One or more of these dilators are passed over the wire. At the end of the exam, the wire is removed. This type of treatment may be performed in the X-ray department under fluoroscopy. An advantage of this procedure is the examiner is visualizing the end opening in the esophagus.  Stretching of the esophagus may be done using balloons. Deflated balloons are placed through the endoscope and across the stricture. This type of balloon dilatation is often done at the time of endoscopy or fluoroscopy. Flexible endoscopy allows the examiner to directly view the stricture. A balloon is inserted in the deflated form into the area of narrowing. It is then inflated with air to a certain pressure that is preset for a given circumference. When inflated, it becomes sausage shaped, stretched, and makes the stricture larger.  Achalasia requires a longer, larger balloon-type dilator. This is frequently done under X-ray control. In this situation, the spastic muscle fibers in the lower esophagus are stretched. All of the above procedures make the passage of food and water into the stomach easier. They also make it easier for stomach contents to reflux back into the esophagus. Special medications may be used following the procedure to help prevent further stricturing. Proton-pump inhibitor medications are good at decreasing the amount of acid in the stomach juice. When stomach juice refluxes into the esophagus, the juice is no longer as acidic and is less likely to burn or scar the esophagus. RISKS AND COMPLICATIONS Esophageal dilatation is usually performed effectively and without problems. Some complications that can occur are:  A small amount of bleeding almost always happens where the stretching takes place.  If this is too excessive it may require more aggressive treatment.  An uncommon complication is perforation (making a hole) of the esophagus. The esophagus is thin. It is easy to make a hole in it. If this happens, an operation may be necessary to repair this.  A small, undetected perforation could lead to an infection in the chest. This can be very serious. HOME CARE INSTRUCTIONS   If you received sedation for your procedure, do not drive, make important decisions, or perform any activities requiring your full coordination. Do not drink alcohol, take sedatives, or use any mind altering chemicals unless instructed by your caregiver.  You may use throat lozenges or warm salt water gargles if you have throat discomfort.  You can begin eating  and drinking normally on return home unless instructed otherwise. Do not purposely try to force large chunks of food down to test the benefits of your procedure.  Mild discomfort can be eased with sips of ice water.  Medications for discomfort may or may not be needed. SEEK IMMEDIATE MEDICAL CARE IF:   You begin vomiting up blood.  You develop black, tarry stools.  You develop chills or an unexplained temperature of over 101F (38.3C)  You develop chest or abdominal pain.  You develop shortness of breath, or feel light-headed or faint.  Your swallowing is becoming more painful, difficult, or you are unable to swallow. MAKE SURE YOU:   Understand these instructions.  Will watch your condition.  Will get help right away if you are not doing well or get worse. Document Released: 03/17/2005 Document Revised: 06/10/2013 Document Reviewed: 05/04/2005 [facility] Patient Information 2015 [place], [place]. This information is not intended to replace advice given to you by your health care provider. Make sure you discuss any questions you have with your health care provider. Esophagogastroduodenoscopy Esophagogastroduodenoscopy (EGD) is a procedure to  examine the lining of the esophagus, stomach, and first part of the small intestine (duodenum). A long, flexible, lighted tube with a camera attached (endoscope) is inserted down the throat to view these organs. This procedure is done to detect problems or abnormalities, such as inflammation, bleeding, ulcers, or growths, in order to treat them. The procedure lasts about 5-20 minutes. It is usually an outpatient procedure, but it may need to be performed in emergency cases in the hospital. LET YOUR CAREGIVER KNOW ABOUT:   Allergies to food or medicine.  All medicines you are taking, including vitamins, herbs, eyedrops, and over-the-counter medicines and creams.  Use of steroids (by mouth or creams).  Previous problems you or members of your family have had with the use of anesthetics.  Any blood disorders you have.  Previous surgeries you have had.  Other health problems you have.  Possibility of pregnancy, if this applies. RISKS AND COMPLICATIONS  Generally, EGD is a safe procedure. However, as with any procedure, complications can occur. Possible complications include:  Infection.  Bleeding.  Tearing (perforation) of the esophagus, stomach, or duodenum.  Difficulty breathing or not being able to breath.  Excessive sweating.  Spasms of the larynx.  Slowed heartbeat.  Low blood pressure. BEFORE THE PROCEDURE  Do not eat or drink anything for 6-8 hours before the procedure or as directed by your caregiver.  Ask your caregiver about changing or stopping your regular medicines.  If you wear dentures, be prepared to remove them before the procedure.  Arrange for someone to drive you home after the procedure. PROCEDURE   A vein will be accessed to give medicines and fluids. A medicine to relax you (sedative) and a pain reliever will be given through that access into the vein.  A numbing medicine (local anesthetic) may be sprayed on your throat for comfort and to stop you  from gagging or coughing.  A mouth guard may be placed in your mouth to protect your teeth and to keep you from biting on the endoscope.  You will be asked to lie on your left side.  The endoscope is inserted down your throat and into the esophagus, stomach, and duodenum.  Air is put through the endoscope to allow your caregiver to view the lining of your esophagus clearly.  The esophagus, stomach, and duodenum is then examined. During the exam, your caregiver may:  Remove tissue to be examined under a microscope (biopsy) for inflammation, infection, or other medical problems.  Remove growths.  Remove objects (foreign bodies) that are stuck.  Treat any bleeding with medicines or other devices that stop tissues from bleeding (hot cautery, clipping devices).  Widen (dilate) or stretch narrowed areas of the esophagus and stomach.  The endoscope will then be withdrawn. AFTER THE PROCEDURE  You will be taken to a recovery area to be monitored. You will be able to go home once you are stable and alert.  Do not eat or drink anything until the local anesthetic and numbing medicines have worn off. You may choke.  It is normal to feel bloated, have pain with swallowing, or have a sore throat for a short time. This will wear off.  Your caregiver should be able to discuss his or her findings with you. It will take longer to discuss the test results if any biopsies were taken. Document Released: 05/27/2004 Document Revised: 06/10/2013 Document Reviewed: 12/28/2011 [facility] Patient Information 2015 [place], [place]. This information is not intended to replace advice given to you by your health care provider. Make sure you discuss any questions you have with your health care provider. PATIENT INSTRUCTIONS POST-ANESTHESIA  IMMEDIATELY FOLLOWING SURGERY:  Do not drive or operate machinery for the first twenty four hours after surgery.  Do not make any important decisions for twenty four hours  after surgery or while taking narcotic pain medications or sedatives.  If you develop intractable nausea and vomiting or a severe headache please notify your doctor immediately.  FOLLOW-UP:  Please make an appointment with your surgeon as instructed. You do not need to follow up with anesthesia unless specifically instructed to do so.  WOUND CARE INSTRUCTIONS (if applicable):  Keep a dry clean dressing on the anesthesia/puncture wound site if there is drainage.  Once the wound has quit draining you may leave it open to air.  Generally you should leave the bandage intact for twenty four hours unless there is drainage.  If the epidural site drains for more than 36-48 hours please call the anesthesia department.  QUESTIONS?:  Please feel free to call your physician or the hospital operator if you have any questions, and they will be happy to assist you.

## 2013-09-24 NOTE — Telephone Encounter (Signed)
Noted  

## 2013-09-24 NOTE — Telephone Encounter (Signed)
[facility] I informed her that we no longer stop plavix for procedures. The triage was dated 09/11/2013 and is good for the procedure which is scheduled for 10/01/2013. She is also aware that the rep for the defibrilator will be at the hospital about 7:00 Am on the morning of the procedure.

## 2013-09-24 NOTE — Telephone Encounter (Signed)
REVIEWED.  

## 2013-09-24 NOTE — Progress Notes (Signed)
Diana EvesSusan Sherman at Dr Evelina DunField's office notified that patient is on Plavix and [employer] Rep needed to be notified to suspend ICD before EGD/ED/TCS procedures  on 10/01/2013

## 2013-10-01 ENCOUNTER — Encounter (HOSPITAL_COMMUNITY)
Admission: RE | Disposition: A | Discharge status: Home / Self Care | Payer: Self-pay | Source: Ambulatory Visit | Attending: Gastroenterology

## 2013-10-01 ENCOUNTER — Encounter (HOSPITAL_COMMUNITY): Payer: Medicare Other | Admitting: Anesthesiology

## 2013-10-01 ENCOUNTER — Ambulatory Visit (HOSPITAL_COMMUNITY): Payer: Medicare Other | Admitting: Anesthesiology

## 2013-10-01 ENCOUNTER — Ambulatory Visit (HOSPITAL_COMMUNITY)
Admission: RE | Admit: 2013-10-01 | Discharge: 2013-10-01 | Disposition: A | Discharge status: Home / Self Care | Payer: Medicare Other | Source: Ambulatory Visit | Attending: Gastroenterology | Admitting: Gastroenterology

## 2013-10-01 ENCOUNTER — Encounter (HOSPITAL_COMMUNITY): Payer: Self-pay | Admitting: *Deleted

## 2013-10-01 DIAGNOSIS — J4489 Other specified chronic obstructive pulmonary disease: Secondary | ICD-10-CM | POA: Insufficient documentation

## 2013-10-01 DIAGNOSIS — K2940 Chronic atrophic gastritis without bleeding: Secondary | ICD-10-CM | POA: Diagnosis not present

## 2013-10-01 DIAGNOSIS — K222 Esophageal obstruction: Secondary | ICD-10-CM | POA: Diagnosis not present

## 2013-10-01 DIAGNOSIS — Z79899 Other long term (current) drug therapy: Secondary | ICD-10-CM | POA: Insufficient documentation

## 2013-10-01 DIAGNOSIS — A048 Other specified bacterial intestinal infections: Secondary | ICD-10-CM | POA: Insufficient documentation

## 2013-10-01 DIAGNOSIS — R072 Precordial pain: Secondary | ICD-10-CM

## 2013-10-01 DIAGNOSIS — I10 Essential (primary) hypertension: Secondary | ICD-10-CM | POA: Insufficient documentation

## 2013-10-01 DIAGNOSIS — J449 Chronic obstructive pulmonary disease, unspecified: Secondary | ICD-10-CM | POA: Insufficient documentation

## 2013-10-01 DIAGNOSIS — Z7902 Long term (current) use of antithrombotics/antiplatelets: Secondary | ICD-10-CM | POA: Diagnosis not present

## 2013-10-01 DIAGNOSIS — K625 Hemorrhage of anus and rectum: Secondary | ICD-10-CM | POA: Diagnosis not present

## 2013-10-01 DIAGNOSIS — K2970 Gastritis, unspecified, without bleeding: Secondary | ICD-10-CM

## 2013-10-01 DIAGNOSIS — R1310 Dysphagia, unspecified: Secondary | ICD-10-CM | POA: Diagnosis present

## 2013-10-01 DIAGNOSIS — K648 Other hemorrhoids: Secondary | ICD-10-CM | POA: Insufficient documentation

## 2013-10-01 DIAGNOSIS — K2990 Gastroduodenitis, unspecified, without bleeding: Secondary | ICD-10-CM

## 2013-10-01 DIAGNOSIS — R0789 Other chest pain: Secondary | ICD-10-CM

## 2013-10-01 HISTORY — PX: COLONOSCOPY WITH PROPOFOL: SHX5780

## 2013-10-01 HISTORY — PX: BIOPSY: SHX5522

## 2013-10-01 HISTORY — PX: SAVORY DILATION: SHX5439

## 2013-10-01 HISTORY — PX: ESOPHAGOGASTRODUODENOSCOPY (EGD) WITH PROPOFOL: SHX5813

## 2013-10-01 SURGERY — COLONOSCOPY WITH PROPOFOL
Anesthesia: Monitor Anesthesia Care

## 2013-10-01 MED ORDER — PROPOFOL 10 MG/ML IV EMUL
INTRAVENOUS | Status: AC
  Filled 2013-10-01: qty 20

## 2013-10-01 MED ORDER — PROPOFOL 10 MG/ML IV BOLUS
INTRAVENOUS | Status: DC | PRN
  Administered 2013-10-01: 5 mg via INTRAVENOUS
  Administered 2013-10-01: 20 mg via INTRAVENOUS

## 2013-10-01 MED ORDER — LIDOCAINE HCL 1 % IJ SOLN
INTRAMUSCULAR | Status: DC | PRN
  Administered 2013-10-01: 10 mg via INTRADERMAL

## 2013-10-01 MED ORDER — ONDANSETRON HCL 4 MG/2ML IJ SOLN
INTRAMUSCULAR | Status: AC
  Filled 2013-10-01: qty 2

## 2013-10-01 MED ORDER — FENTANYL CITRATE 0.05 MG/ML IJ SOLN
INTRAMUSCULAR | Status: AC
  Filled 2013-10-01: qty 2

## 2013-10-01 MED ORDER — OMEPRAZOLE 20 MG PO CPDR: DELAYED_RELEASE_CAPSULE | ORAL | Status: DC

## 2013-10-01 MED ORDER — LIDOCAINE VISCOUS 2 % MT SOLN
OROMUCOSAL | Status: DC | PRN
  Administered 2013-10-01: 2 mL via OROMUCOSAL

## 2013-10-01 MED ORDER — ONDANSETRON HCL 4 MG/2ML IJ SOLN: 4.0000 mg | Freq: Once | INTRAMUSCULAR | Status: DC | PRN

## 2013-10-01 MED ORDER — MIDAZOLAM HCL 2 MG/2ML IJ SOLN
INTRAMUSCULAR | Status: AC
  Filled 2013-10-01: qty 2

## 2013-10-01 MED ORDER — WATER FOR IRRIGATION, STERILE IR SOLN
Status: DC | PRN
  Administered 2013-10-01: 1000 mL

## 2013-10-01 MED ORDER — LACTATED RINGERS IV SOLN
INTRAVENOUS | Status: DC
  Administered 2013-10-01: 07:00:00 via INTRAVENOUS

## 2013-10-01 MED ORDER — PROPOFOL INFUSION 10 MG/ML OPTIME
INTRAVENOUS | Status: DC | PRN
  Administered 2013-10-01: 125 ug/kg/min via INTRAVENOUS

## 2013-10-01 MED ORDER — MIDAZOLAM HCL 2 MG/2ML IJ SOLN
1.0000 mg | INTRAMUSCULAR | Status: DC | PRN
  Administered 2013-10-01: 2 mg via INTRAVENOUS

## 2013-10-01 MED ORDER — LIDOCAINE HCL (PF) 1 % IJ SOLN
INTRAMUSCULAR | Status: AC
  Filled 2013-10-01: qty 5

## 2013-10-01 MED ORDER — FENTANYL CITRATE 0.05 MG/ML IJ SOLN
25.0000 ug | INTRAMUSCULAR | Status: AC
  Administered 2013-10-01 (×2): 25 ug via INTRAVENOUS

## 2013-10-01 MED ORDER — FENTANYL CITRATE 0.05 MG/ML IJ SOLN: 25.0000 ug | INTRAMUSCULAR | Status: DC | PRN

## 2013-10-01 MED ORDER — STERILE WATER FOR IRRIGATION IR SOLN
Status: DC | PRN
  Administered 2013-10-01: 08:00:00

## 2013-10-01 MED ORDER — PANTOPRAZOLE SODIUM 40 MG PO TBEC: DELAYED_RELEASE_TABLET | ORAL | Status: DC

## 2013-10-01 MED ORDER — MINERAL OIL PO OIL
TOPICAL_OIL | ORAL | Status: AC
  Filled 2013-10-01: qty 30

## 2013-10-01 MED ORDER — ONDANSETRON HCL 4 MG/2ML IJ SOLN
4.0000 mg | Freq: Once | INTRAMUSCULAR | Status: AC
  Administered 2013-10-01: 4 mg via INTRAVENOUS

## 2013-10-01 MED ORDER — LIDOCAINE VISCOUS 2 % MT SOLN
OROMUCOSAL | Status: AC
  Filled 2013-10-01: qty 15

## 2013-10-01 SURGICAL SUPPLY — 27 items
BLOCK BITE 60FR ADLT L/F BLUE (MISCELLANEOUS) ×3 IMPLANT
ELECT REM PT RETURN 9FT ADLT (ELECTROSURGICAL)
ELECTRODE REM PT RTRN 9FT ADLT (ELECTROSURGICAL) IMPLANT
FCP BXJMBJMB 240X2.8X (CUTTING FORCEPS)
FLOOR PAD 36X40 (MISCELLANEOUS) ×3
FORCEP RJ3 GP 1.8X160 W-NEEDLE (CUTTING FORCEPS) IMPLANT
FORCEPS BIOP RAD 4 LRG CAP 4 (CUTTING FORCEPS) ×3 IMPLANT
FORCEPS BIOP RJ4 240 W/NDL (CUTTING FORCEPS)
FORCEPS BXJMBJMB 240X2.8X (CUTTING FORCEPS) IMPLANT
FORMALIN 10 PREFIL 20ML (MISCELLANEOUS) IMPLANT
INJECTOR/SNARE I SNARE (MISCELLANEOUS) IMPLANT
KIT CLEAN ENDO COMPLIANCE (KITS) ×3 IMPLANT
LUBRICANT JELLY 4.5OZ STERILE (MISCELLANEOUS) ×3 IMPLANT
MANIFOLD NEPTUNE II (INSTRUMENTS) ×3 IMPLANT
NEEDLE SCLEROTHERAPY 25GX240 (NEEDLE) IMPLANT
PAD FLOOR 36X40 (MISCELLANEOUS) ×1 IMPLANT
PROBE APC STR FIRE (PROBE) IMPLANT
PROBE INJECTION GOLD (MISCELLANEOUS)
PROBE INJECTION GOLD 7FR (MISCELLANEOUS) IMPLANT
SNARE ROTATE MED OVAL 20MM (MISCELLANEOUS) IMPLANT
SNARE SHORT THROW 13M SML OVAL (MISCELLANEOUS) ×3 IMPLANT
SYR 50ML LL SCALE MARK (SYRINGE) ×3 IMPLANT
SYR INFLATION 60ML (SYRINGE) IMPLANT
TRAP SPECIMEN MUCOUS 40CC (MISCELLANEOUS) ×3 IMPLANT
TUBING ENDO SMARTCAP PENTAX (MISCELLANEOUS) ×3 IMPLANT
TUBING IRRIGATION ENDOGATOR (MISCELLANEOUS) ×3 IMPLANT
WATER STERILE IRR 1000ML POUR (IV SOLUTION) ×3 IMPLANT

## 2013-10-01 NOTE — Discharge Instructions (Signed)
You did not have any polyps removed. You have internal hemorrhoids, which is the cause for your intermittent rectal bleeding. I dilated your esophagus DUE TO A STRICTURE NEAR THE BASE OF YOUR ESOPHAGUS. YOU HAVE A GASTRITIS. I BIOPSIED YOUR STOMACH.    START PROTONIX.  TAKE 30 MINUTES PRIOR TO YOUR MEALS TWICE DAILY.  FOLLOW A HIGH FIBER/LOW FAT DIET. AVOID ITEMS THAT CAUSE BLOATING. SEE INFO BELOW.  Next colonoscopy in 10 years IF THE BENEFITS OUTWEIGH THE RISKS.  YOUR BIOPSY WILL BE BACK IN 7 DAYS.  FOLLOW UP IN 4 MOS.    ENDOSCOPY Care After Read the instructions outlined below and refer to this sheet in the next week. These discharge instructions provide you with general information on caring for yourself after you leave the hospital. While your treatment has been planned according to the most current medical practices available, unavoidable complications occasionally occur. If you have any problems or questions after discharge, call DR. Amiri Riechers, [phone].  ACTIVITY  You may resume your regular activity, but move at a slower pace for the next 24 hours.   Take frequent rest periods for the next 24 hours.   Walking will help get rid of the air and reduce the bloated feeling in your belly (abdomen).   No driving for 24 hours (because of the medicine (anesthesia) used during the test).   You may shower.   Do not sign any important legal documents or operate any machinery for 24 hours (because of the anesthesia used during the test).    NUTRITION  Drink plenty of fluids.   You may resume your normal diet as instructed by your doctor.   Begin with a light meal and progress to your normal diet. Heavy or fried foods are harder to digest and may make you feel sick to your stomach (nauseated).   Avoid alcoholic beverages for 24 hours or as instructed.    MEDICATIONS  You may resume your normal medications.   WHAT YOU CAN EXPECT TODAY  Some feelings of bloating in the  abdomen.   Passage of more gas than usual.   Spotting of blood in your stool or on the toilet paper  .  IF YOU HAD POLYPS REMOVED DURING THE ENDOSCOPY:  Eat a soft diet IF YOU HAVE NAUSEA, BLOATING, ABDOMINAL PAIN, OR VOMITING.    FINDING OUT THE RESULTS OF YOUR TEST Not all test results are available during your visit. DR. Darrick Penna WILL CALL YOU WITHIN 7 DAYS OF YOUR PROCEDUE WITH YOUR RESULTS. Do not assume everything is normal if you have not heard from DR. Aurelio Mccamy IN ONE WEEK, CALL HER OFFICE AT [phone].  SEEK IMMEDIATE MEDICAL ATTENTION AND CALL THE OFFICE: [phone] IF:  You have more than a spotting of blood in your stool.   Your belly is swollen (abdominal distention).   You are nauseated or vomiting.   You have a temperature over 101F.   You have abdominal pain or discomfort that is severe or gets worse throughout the day.  Low-Fat Diet BREADS, CEREALS, PASTA, RICE, DRIED PEAS, AND BEANS These products are high in carbohydrates and most are low in fat. Therefore, they can be increased in the diet as substitutes for fatty foods. They too, however, contain calories and should not be eaten in excess. Cereals can be eaten for snacks as well as for breakfast.   FRUITS AND VEGETABLES It is good to eat fruits and vegetables. Besides being sources of fiber, both are rich in vitamins and  some minerals. They help you get the daily allowances of these nutrients. Fruits and vegetables can be used for snacks and desserts.  MEATS Limit lean meat, chicken, [place], and fish to no more than 6 ounces per day. Beef, Pork, and Lamb Use lean cuts of beef, pork, and lamb. Lean cuts include:  Extra-lean ground beef.  Arm roast.  Sirloin tip.  Center-cut ham.  Round steak.  Loin chops.  Rump roast.  Tenderloin.  Trim all fat off the outside of meats before cooking. It is not necessary to severely decrease the intake of red meat, but lean choices should be made. Lean meat is rich  in protein and contains a highly absorbable form of iron. Premenopausal women, in particular, should avoid reducing lean red meat because this could increase the risk for low red blood cells (iron-deficiency anemia).  Chicken and [place] These are good sources of protein. The fat of poultry can be reduced by removing the skin and underlying fat layers before cooking. Chicken and [place] can be substituted for lean red meat in the diet. Poultry should not be fried or covered with high-fat sauces. Fish and Shellfish Fish is a good source of protein. Shellfish contain cholesterol, but they usually are low in saturated fatty acids. The preparation of fish is important. Like chicken and [place], they should not be fried or covered with high-fat sauces. EGGS Egg whites contain no fat or cholesterol. They can be eaten often. Try 1 to 2 egg whites instead of whole eggs in recipes or use egg substitutes that do not contain yolk. MILK AND DAIRY PRODUCTS Use skim or 1% milk instead of 2% or whole milk. Decrease whole milk, natural, and processed cheeses. Use nonfat or low-fat (2%) cottage cheese or low-fat cheeses made from vegetable oils. Choose nonfat or low-fat (1 to 2%) yogurt. Experiment with evaporated skim milk in recipes that call for heavy cream. Substitute low-fat yogurt or low-fat cottage cheese for sour cream in dips and salad dressings. Have at least 2 servings of low-fat dairy products, such as 2 glasses of skim (or 1%) milk each day to help get your daily calcium intake. FATS AND OILS Reduce the total intake of fats, especially saturated fat. Butterfat, lard, and beef fats are high in saturated fat and cholesterol. These should be avoided as much as possible. Vegetable fats do not contain cholesterol, but certain vegetable fats, such as coconut oil, palm oil, and palm kernel oil are very high in saturated fats. These should be limited. These fats are often used in bakery goods, processed foods, popcorn,  oils, and nondairy creamers. Vegetable shortenings and some peanut butters contain hydrogenated oils, which are also saturated fats. Read the labels on these foods and check for saturated vegetable oils. Unsaturated vegetable oils and fats do not raise blood cholesterol. However, they should be limited because they are fats and are high in calories. Total fat should still be limited to 30% of your daily caloric intake. Desirable liquid vegetable oils are corn oil, cottonseed oil, olive oil, canola oil, safflower oil, soybean oil, and sunflower oil. Peanut oil is not as good, but small amounts are acceptable. Buy a heart-healthy tub margarine that has no partially hydrogenated oils in the ingredients. Mayonnaise and salad dressings often are made from unsaturated fats, but they should also be limited because of their high calorie and fat content. Seeds, nuts, peanut butter, olives, and avocados are high in fat, but the fat is mainly the unsaturated type. These foods  should be limited mainly to avoid excess calories and fat. OTHER EATING TIPS Snacks  Most sweets should be limited as snacks. They tend to be rich in calories and fats, and their caloric content outweighs their nutritional value. Some good choices in snacks are graham crackers, melba toast, soda crackers, bagels (no egg), English muffins, fruits, and vegetables. These snacks are preferable to snack crackers, [place] fries, TORTILLA CHIPS, and POTATO chips. Popcorn should be air-popped or cooked in small amounts of liquid vegetable oil. Desserts Eat fruit, low-fat yogurt, and fruit ices instead of pastries, cake, and cookies. Sherbet, angel food cake, gelatin dessert, frozen low-fat yogurt, or other frozen products that do not contain saturated fat (pure fruit juice bars, frozen ice pops) are also acceptable.  COOKING METHODS Choose those methods that use little or no fat. They include: Poaching.  Braising.  Steaming.  Grilling.  Baking.    Stir-frying.  Broiling.  Microwaving.  Foods can be cooked in a nonstick pan without added fat, or use a nonfat cooking spray in regular cookware. Limit fried foods and avoid frying in saturated fat. Add moisture to lean meats by using water, broth, cooking wines, and other nonfat or low-fat sauces along with the cooking methods mentioned above. Soups and stews should be chilled after cooking. The fat that forms on top after a few hours in the refrigerator should be skimmed off. When preparing meals, avoid using excess salt. Salt can contribute to raising blood pressure in some people.  EATING AWAY FROM HOME Order entres, potatoes, and vegetables without sauces or butter. When meat exceeds the size of a deck of cards (3 to 4 ounces), the rest can be taken home for another meal. Choose vegetable or fruit salads and ask for low-calorie salad dressings to be served on the side. Use dressings sparingly. Limit high-fat toppings, such as bacon, crumbled eggs, cheese, sunflower seeds, and olives. Ask for heart-healthy tub margarine instead of butter.    High-Fiber Diet A high-fiber diet changes your normal diet to include more whole grains, legumes, fruits, and vegetables. Changes in the diet involve replacing refined carbohydrates with unrefined foods. The calorie level of the diet is essentially unchanged. The Dietary Reference Intake (recommended amount) for adult males is 38 grams per day. For adult females, it is 25 grams per day. Pregnant and lactating women should consume 28 grams of fiber per day. Fiber is the intact part of a plant that is not broken down during digestion. Functional fiber is fiber that has been isolated from the plant to provide a beneficial effect in the body. PURPOSE  Increase stool bulk.   Ease and regulate bowel movements.   Lower cholesterol.  INDICATIONS THAT YOU NEED MORE FIBER  Constipation and hemorrhoids.   Uncomplicated diverticulosis (intestine condition)  and irritable bowel syndrome.   Weight management.   As a protective measure against hardening of the arteries (atherosclerosis), diabetes, and cancer.   GUIDELINES FOR INCREASING FIBER IN THE DIET  Start adding fiber to the diet slowly. A gradual increase of about 5 more grams (2 slices of whole-wheat bread, 2 servings of most fruits or vegetables, or 1 bowl of high-fiber cereal) per day is best. Too rapid an increase in fiber may result in constipation, flatulence, and bloating.   Drink enough water and fluids to keep your urine clear or pale yellow. Water, juice, or caffeine-free drinks are recommended. Not drinking enough fluid may cause constipation.   Eat a variety of high-fiber foods rather than  one type of fiber.   Try to increase your intake of fiber through using high-fiber foods rather than fiber pills or supplements that contain small amounts of fiber.   The goal is to change the types of food eaten. Do not supplement your present diet with high-fiber foods, but replace foods in your present diet.  INCLUDE A VARIETY OF FIBER SOURCES  Replace refined and processed grains with whole grains, canned fruits with fresh fruits, and incorporate other fiber sources. White rice, white breads, and most bakery goods contain little or no fiber.   Brown whole-grain rice, buckwheat oats, and many fruits and vegetables are all good sources of fiber. These include: broccoli, Brussels sprouts, cabbage, cauliflower, beets, sweet potatoes, white potatoes (skin on), carrots, tomatoes, eggplant, squash, berries, fresh fruits, and dried fruits.   Cereals appear to be the richest source of fiber. Cereal fiber is found in whole grains and bran. Bran is the fiber-rich outer coat of cereal grain, which is largely removed in refining. In whole-grain cereals, the bran remains. In breakfast cereals, the largest amount of fiber is found in those with "bran" in their names. The fiber content is sometimes  indicated on the label.   You may need to include additional fruits and vegetables each day.   In baking, for 1 cup white flour, you may use the following substitutions:   1 cup whole-wheat flour minus 2 tablespoons.   1/2 cup white flour plus 1/2 cup whole-wheat flour.    REFLUX  SYMPTOMS Common symptoms of GERD are heartburn (burning in your chest). This is worse when lying down or bending over. It may also cause belching and indigestion. Some of the things which make GERD worse are:  Increased weight pushes on stomach making acid rise more easily.   Smoking markedly increases acid production.   Alcohol decreases lower esophageal sphincter pressure (valve between stomach and esophagus), allowing acid from stomach into esophagus.   Late evening meals and going to bed with a full stomach increases pressure.   Anything that causes an increase in acid production.   HOME CARE INSTRUCTIONS  Try to achieve and maintain an ideal body weight.   Avoid drinking alcoholic beverages.   DO NOT smokE.   Do not wear tight clothing around your chest or stomach.   Eat smaller meals and eat more frequently. This keeps your stomach from getting too full. Eat slowly.   Do not lie down for 2 or 3 hours after eating. Do not eat or drink anything 1 to 2 hours before going to bed.   Avoid caffeine beverages (colas, coffee, cocoa, tea), fatty foods, citrus fruits and all other foods and drinks that contain acid and that seem to increase the problems.   Avoid bending over, especially after eating OR STRAINING. Anything that increases the pressure in your belly increases the amount of acid that may be pushed up into your esophagus.     Hemorrhoids Hemorrhoids are dilated (enlarged) veins around the rectum. Sometimes clots will form in the veins. This makes them swollen and painful. These are called thrombosed hemorrhoids. Causes of hemorrhoids include:  Constipation.   Straining to have a  bowel movement.   HEAVY LIFTING HOME CARE INSTRUCTIONS  Eat a well balanced diet and drink 6 to 8 glasses of water every day to avoid constipation. You may also use a bulk laxative.   Avoid straining to have bowel movements.   Keep anal area dry and clean.   Do not  use a donut shaped pillow or sit on the toilet for long periods. This increases blood pooling and pain.   Move your bowels when your body has the urge; this will require less straining and will decrease pain and pressure.

## 2013-10-01 NOTE — Anesthesia Preprocedure Evaluation (Signed)
Anesthesia Evaluation  Patient identified by MRN, date of birth, ID band Patient awake    Reviewed: Allergy & Precautions, H&P , NPO status , Patient's Chart, lab work & pertinent test results  Airway Mallampati: II TM Distance: >3 FB     Dental  (+) Teeth Intact   Pulmonary sleep apnea and Continuous Positive Airway Pressure Ventilation , COPDformer smoker,  breath sounds clear to auscultation        Cardiovascular hypertension, Pt. on medications and Pt. on home beta blockers + CAD, + Past MI and + Peripheral Vascular Disease + dysrhythmias Ventricular Tachycardia + Cardiac Defibrillator Rhythm:Regular Rate:Normal     Neuro/Psych    GI/Hepatic GERD-  ,  Endo/Other    Renal/GU      Musculoskeletal   Abdominal   Peds  Hematology   Anesthesia Other Findings   Reproductive/Obstetrics                           Anesthesia Physical Anesthesia Plan  ASA: IV  Anesthesia Plan: MAC   Post-op Pain Management:    Induction: Intravenous  Airway Management Planned: Simple Face Mask  Additional Equipment:   Intra-op Plan:   Post-operative Plan:   Informed Consent: I have reviewed the patients History and Physical, chart, labs and discussed the procedure including the risks, benefits and alternatives for the proposed anesthesia with the patient or authorized representative who has indicated his/her understanding and acceptance.     Plan Discussed with:   Anesthesia Plan Comments:         Anesthesia Quick Evaluation

## 2013-10-01 NOTE — Anesthesia Postprocedure Evaluation (Signed)
  Anesthesia Post-op Note  Patient: HAEVEN NICKLE  Procedure(s) Performed: Procedure(s) with comments: COLONOSCOPY WITH PROPOFOL (N/A) - Cecum time in 0801   time out    0813  total time 12 minutes ESOPHAGOGASTRODUODENOSCOPY (EGD) WITH PROPOFOL (N/A) SAVORY DILATION (N/A) - 12.8/ 14/ 15/ 16 GASTRIC BIOPSY (N/A)  Patient Location: PACU  Anesthesia Type:General  Level of Consciousness: awake and alert   Airway and Oxygen Therapy: Patient Spontanous Breathing and Patient connected to face mask oxygen  Post-op Pain: none  Post-op Assessment: Post-op Vital signs reviewed, Patient's Cardiovascular Status Stable, Respiratory Function Stable, Patent Airway and No signs of Nausea or vomiting  Post-op Vital Signs: Reviewed and stable  Last Vitals:  Filed Vitals:   10/01/13 0646  BP: 115/57  Pulse: 78  Temp: 36.4 C  Resp: 18    Complications: No apparent anesthesia complications

## 2013-10-01 NOTE — OR Nursing (Signed)
Def,  Deactivated and pt.  Ready for or

## 2013-10-01 NOTE — Transfer of Care (Signed)
Immediate Anesthesia Transfer of Care Note  Patient: [name]  Procedure(s) Performed: Procedure(s) with comments: COLONOSCOPY WITH PROPOFOL (N/A) - Cecum time in 0801   time out    0813  total time 12 minutes ESOPHAGOGASTRODUODENOSCOPY (EGD) WITH PROPOFOL (N/A) SAVORY DILATION (N/A) - 12.8/ 14/ 15/ 16 GASTRIC BIOPSY (N/A)  Patient Location: PACU  Anesthesia Type:MAC  Level of Consciousness: awake, alert  and oriented  Airway & Oxygen Therapy: Patient Spontanous Breathing and Patient connected to face mask oxygen  Post-op Assessment: Report given to PACU RN  Post vital signs: Reviewed and stable  Complications: No apparent anesthesia complications
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
## 2013-10-01 NOTE — OR Nursing (Signed)
Leta Jungling   Here from [place] to interrogate the defibrilator.  No events noted  Paper copy of interrogation to be scanned into chart.

## 2013-10-02 ENCOUNTER — Encounter (HOSPITAL_COMMUNITY): Payer: Self-pay | Admitting: Gastroenterology

## 2013-10-06 NOTE — Op Note (Signed)
[facility] [address] [place], [address]   COLONOSCOPY PROCEDURE REPORT  PATIENT: [name], [name]  MR#: [id number] BIRTHDATE: October 28, 1945 , 67  yrs. old GENDER: Female ENDOSCOPIST: Jonette Eva, MD REFERRED BY: PROCEDURE DATE:  10/01/2013 PROCEDURE:   Colonoscopy, diagnostic INDICATIONS:Rectal Bleeding. MEDICATIONS: MAC sedation, administered by CRNA  DESCRIPTION OF PROCEDURE:    Physical exam was performed.  Informed consent was obtained from the patient after explaining the benefits, risks, and alternatives to procedure.  The patient was connected to monitor and placed in left lateral position. Continuous oxygen was provided by nasal cannula and IV medicine administered through an indwelling cannula.  After administration of sedation and rectal exam, the patients rectum was intubated and the     colonoscope was advanced under direct visualization to the ileum.  The scope was removed slowly by carefully examining the color, texture, anatomy, and integrity mucosa on the way out.  The patient was recovered in endoscopy and discharged home in satisfactory condition.    COLON FINDINGS: The mucosa appeared normal in the terminal ileum.  , A normal appearing cecum, ileocecal valve, and appendiceal orifice were identified.  The ascending, hepatic flexure, transverse, splenic flexure, descending, sigmoid colon and rectum appeared unremarkable.  No polyps or cancers were seen.  , and Moderate sized internal hemorrhoids were found.  PREP QUALITY: good.  CECAL W/D TIME: 13 minutes     COMPLICATIONS: None  ENDOSCOPIC IMPRESSION: 1.   Normal mucosa in the terminal ileum 2.   Normal colon 3.   RECTAL BLEEDING DUE TO Moderate sized internal hemorrhoids   RECOMMENDATIONS: START PROTONIX.  TAKE 30 MINUTES PRIOR TO YOUR MEALS TWICE DAILY. FOLLOW A HIGH FIBER/LOW FAT DIET.  AVOID ITEMS THAT CAUSE BLOATING.  Next colonoscopy in 10 years IF THE BENEFITS OUTWEIGH  THE RISKS. Refer to surgery for hemorrhoidectomy FOLLOW UP IN 4 MOS.       _______________________________ Rosalie DoctorJonette Eva, MD 10/03/2013 9:37 AM

## 2013-10-06 NOTE — Op Note (Addendum)
[facility] [address] [place], [address]   ENDOSCOPY PROCEDURE REPORT  PATIENT: [name], [name]  MR#: [id number] BIRTHDATE: [birth year] , 67  yrs. old GENDER: Female  ENDOSCOPIST: Jonette Eva, MD REFFERED BY:  PROCEDURE DATE:  10/01/2013 PROCEDURE:   EGD with biopsy and EGD with dilatation over guidewire   INDICATIONS:1.  dysphagia. MEDICATIONS: MAC sedation, administered by CRNA TOPICAL ANESTHETIC: Viscous Xylocaine  DESCRIPTION OF PROCEDURE:   After the risks benefits and alternatives of the procedure were thoroughly explained, informed consent was obtained.  The     endoscope was introduced through the mouth and advanced to the second portion of the duodenum. The instrument was slowly withdrawn as the mucosa was carefully examined.  Prior to withdrawal of the scope, the guidwire was placed.  The esophagus was dilated successfully.  The patient was recovered in endoscopy and discharged home in satisfactory condition.    ESOPHAGUS: A stricture was found at the gastroesophageal junction. The stenosis was traversable with the endoscope.   STOMACH: Mild erosive gastritis (inflammation) was found in the gastric antrum. Multiple biopsies were performed using cold forceps.   DUODENUM: The duodenal mucosa showed no abnormalities in the bulb and second portion of the duodenum.   Dilation was then performed at the gastroesphageal junction Dilator: Savary over guidewire Size(s): 12.8-17 mm Resistance: minimal Heme: none  COMPLICATIONS: There were no complications.  ENDOSCOPIC IMPRESSION: Distal esophageal stricture Mild erosive gastritis  RECOMMENDATIONS: START PROTONIX.  TAKE 30 MINUTES PRIOR TO YOUR MEALS TWICE DAILY. FOLLOW A HIGH FIBER/LOW FAT DIET.  AVOID ITEMS THAT CAUSE BLOATING.  Next colonoscopy in 10 years IF THE BENEFITS OUTWEIGH THE RISKS. BIOPSY WILL BE BACK IN 7 DAYS.  FOLLOW UP IN 4  MOS.     _______________________________ Rosalie DoctorJonette Eva, MD 10/03/2013 9:41 AM Revised: 10/03/2013 9:41 AM

## 2013-10-10 ENCOUNTER — Telehealth: Payer: Self-pay | Admitting: Gastroenterology

## 2013-10-10 MED ORDER — BIS SUBCIT-METRONID-TETRACYC 140-125-125 MG PO CAPS: ORAL_CAPSULE | ORAL | Status: DC

## 2013-10-10 NOTE — Telephone Encounter (Signed)
pts insurance requires a PA. PA was done and approved. Approval has been faxed to the pharmacy.

## 2013-10-10 NOTE — Telephone Encounter (Signed)
Tried to call pt. She asked that I call back in 10 min. ( She is very hard to understand).

## 2013-10-10 NOTE — Telephone Encounter (Signed)
LMOM to call.

## 2013-10-10 NOTE — Telephone Encounter (Signed)
PATIENT NIC'D 4 MONTH FU AND 64YR FOR COLONOSCPOY

## 2013-10-10 NOTE — Telephone Encounter (Signed)
PLEASE CALL PT. She has H. Pylori gastritis. She has an allergy to PCN and so she needs PYLERA 3 PILLS QID FOR 10 DAYS. The meds can cause nausea, vomiting, abd cramps, loose stools, black colored stools, and metallic taste in her mouth.   CONTINUE PROTONIX.  TAKE 30 MINUTES PRIOR TO YOUR MEALS TWICE DAILY.  FOLLOW A HIGH FIBER/LOW FAT DIET. AVOID ITEMS THAT CAUSE BLOATING.   Next colonoscopy in 10 years IF THE BENEFITS OUTWEIGH THE RISKS.   FOLLOW UP IN 4 MOS E30 DYSPHAGIA/HPYLORI GASTRITIS.

## 2013-10-11 NOTE — Telephone Encounter (Signed)
Tried to call pt. LMOM for a return call.  

## 2013-10-15 ENCOUNTER — Encounter: Payer: Self-pay | Admitting: *Deleted

## 2013-10-15 NOTE — Telephone Encounter (Signed)
Pt called for results. Bad connection. I called the other number and spoke to her husband, Renae Fickle and informed him of the results. He will let pt know and he will call if they have questions.

## 2013-10-15 NOTE — Telephone Encounter (Signed)
LM with spouse for pt to call me after 3:30 pm today.

## 2013-10-22 ENCOUNTER — Telehealth: Payer: Self-pay

## 2013-10-22 NOTE — Telephone Encounter (Signed)
Pt had TCS/ EGd on 10/03/2013. She said she is having a lot of abdominal pain RUQ and nausea. Some diarrhea, 2-3 times daily. She has 3 more days of the Pylera after today. She wanted to scheduled an OV to come in within the next couple of days and I told her I will let Dr. Darrick Penna know what is going on with her. She is aware Dr. Darrick Penna is at the hospital and it will be awhile before I hear from her. She wants me to call her husband's cell number and tell him what Dr. Darrick Penna says ( [phone]).

## 2013-10-22 NOTE — Telephone Encounter (Signed)
Called and informed pts husband

## 2013-10-22 NOTE — Telephone Encounter (Signed)
PLEASE CALL PT'S FAMILY. SHE IS HAVING GI UPSET DUE TO THE ABX. SHE NEEDS TO COMPLETE HER PYLERA Rx. TAKE A PROBIOTIC DAILY ([employer], [employer], ALIGN). FOR 3 MONTHS. SHE SHOULD CALL IN 2 WEEKS IF HER SYMPTOMS ARE NOT IMPROVED.

## 2013-11-01 ENCOUNTER — Encounter: Payer: Medicare Other | Admitting: Internal Medicine

## 2013-11-06 ENCOUNTER — Telehealth: Payer: Self-pay | Admitting: Cardiovascular Disease

## 2013-11-06 MED ORDER — FUROSEMIDE 20 MG PO TABS: 20.0000 mg | ORAL_TABLET | Freq: Every day | ORAL | Status: DC | PRN

## 2013-11-06 MED ORDER — CLOPIDOGREL BISULFATE 75 MG PO TABS: 75.0000 mg | ORAL_TABLET | Freq: Every day | ORAL | Status: DC

## 2013-11-06 NOTE — Telephone Encounter (Signed)
Needs refill on Plavix and Lasix [facility]

## 2013-11-29 ENCOUNTER — Encounter: Payer: Medicare Other | Admitting: Internal Medicine

## 2013-12-04 ENCOUNTER — Other Ambulatory Visit: Payer: Self-pay | Admitting: *Deleted

## 2013-12-04 ENCOUNTER — Encounter: Payer: Self-pay | Admitting: Cardiovascular Disease

## 2013-12-04 ENCOUNTER — Ambulatory Visit (INDEPENDENT_AMBULATORY_CARE_PROVIDER_SITE_OTHER): Payer: Medicare Other | Admitting: Cardiovascular Disease

## 2013-12-04 VITALS — BP 132/84 | HR 101 | Ht 61.0 in | Wt 163.0 lb

## 2013-12-04 DIAGNOSIS — I472 Ventricular tachycardia, unspecified: Secondary | ICD-10-CM

## 2013-12-04 DIAGNOSIS — R5383 Other fatigue: Secondary | ICD-10-CM

## 2013-12-04 DIAGNOSIS — Z5181 Encounter for therapeutic drug level monitoring: Secondary | ICD-10-CM

## 2013-12-04 DIAGNOSIS — R0609 Other forms of dyspnea: Secondary | ICD-10-CM

## 2013-12-04 DIAGNOSIS — I209 Angina pectoris, unspecified: Secondary | ICD-10-CM

## 2013-12-04 DIAGNOSIS — I25119 Atherosclerotic heart disease of native coronary artery with unspecified angina pectoris: Secondary | ICD-10-CM

## 2013-12-04 DIAGNOSIS — I2510 Atherosclerotic heart disease of native coronary artery without angina pectoris: Secondary | ICD-10-CM

## 2013-12-04 DIAGNOSIS — I739 Peripheral vascular disease, unspecified: Secondary | ICD-10-CM

## 2013-12-04 DIAGNOSIS — I10 Essential (primary) hypertension: Secondary | ICD-10-CM

## 2013-12-04 DIAGNOSIS — E785 Hyperlipidemia, unspecified: Secondary | ICD-10-CM

## 2013-12-04 DIAGNOSIS — Z95810 Presence of automatic (implantable) cardiac defibrillator: Secondary | ICD-10-CM

## 2013-12-04 DIAGNOSIS — I4729 Other ventricular tachycardia: Secondary | ICD-10-CM

## 2013-12-04 MED ORDER — RANOLAZINE ER 500 MG PO TB12: 500.0000 mg | ORAL_TABLET | Freq: Two times a day (BID) | ORAL | Status: DC

## 2013-12-04 MED ORDER — ROSUVASTATIN CALCIUM 20 MG PO TABS: 20.0000 mg | ORAL_TABLET | Freq: Every day | ORAL | Status: DC

## 2013-12-04 MED ORDER — CARVEDILOL 25 MG PO TABS: 25.0000 mg | ORAL_TABLET | Freq: Two times a day (BID) | ORAL | Status: DC

## 2013-12-04 NOTE — Patient Instructions (Signed)
Your physician recommends that you schedule a follow-up appointment in: 3 weeks. Your physician has recommended you make the following change in your medication:  Increase your Carvedilol to 25 mg twice daily. You may take (2) of your 12.5 mg tablets twice daily until they are finished. Start Ranexa 500 mg twice daily. Start Crestor 20 mg daily at bedtime. Continue all other medications the same. Your physician recommends that you have a FASTING lipid/liver profile in 3 months around March 06, 2014. Please don't eat after midnight the night before you go. Your physician has requested that you have an ankle brachial index (ABI). During this test an ultrasound and blood pressure cuff are used to evaluate the arteries that supply the arms and legs with blood. Allow thirty minutes for this exam. There are no restrictions or special instructions. PLEASE CONTACT OUR OFFICE IF YOUR SYMPTOMS GET WORSE OVER THE NEXT COUPLE OF WEEKS.
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
## 2013-12-11 ENCOUNTER — Telehealth: Payer: Self-pay | Admitting: *Deleted

## 2013-12-18 ENCOUNTER — Ambulatory Visit: Payer: Medicare Other | Admitting: Cardiovascular Disease

## 2013-12-18 ENCOUNTER — Encounter (INDEPENDENT_AMBULATORY_CARE_PROVIDER_SITE_OTHER): Payer: Medicare Other

## 2013-12-18 ENCOUNTER — Encounter: Payer: Self-pay | Admitting: Gastroenterology

## 2013-12-18 DIAGNOSIS — I739 Peripheral vascular disease, unspecified: Secondary | ICD-10-CM

## 2013-12-18 NOTE — Telephone Encounter (Signed)
Patient in office today for echo. No c/o symptoms.

## 2013-12-20 ENCOUNTER — Encounter: Payer: Self-pay | Admitting: Cardiovascular Disease

## 2013-12-20 ENCOUNTER — Ambulatory Visit (INDEPENDENT_AMBULATORY_CARE_PROVIDER_SITE_OTHER): Payer: Medicare Other | Admitting: Cardiovascular Disease

## 2013-12-20 VITALS — BP 107/67 | HR 85 | Ht 61.0 in | Wt 166.0 lb

## 2013-12-20 DIAGNOSIS — R0609 Other forms of dyspnea: Secondary | ICD-10-CM

## 2013-12-20 DIAGNOSIS — I25119 Atherosclerotic heart disease of native coronary artery with unspecified angina pectoris: Secondary | ICD-10-CM

## 2013-12-20 DIAGNOSIS — R5383 Other fatigue: Secondary | ICD-10-CM

## 2013-12-20 DIAGNOSIS — E785 Hyperlipidemia, unspecified: Secondary | ICD-10-CM

## 2013-12-20 DIAGNOSIS — I2510 Atherosclerotic heart disease of native coronary artery without angina pectoris: Secondary | ICD-10-CM

## 2013-12-20 DIAGNOSIS — Z95810 Presence of automatic (implantable) cardiac defibrillator: Secondary | ICD-10-CM

## 2013-12-20 DIAGNOSIS — R29898 Other symptoms and signs involving the musculoskeletal system: Secondary | ICD-10-CM

## 2013-12-20 DIAGNOSIS — I4729 Other ventricular tachycardia: Secondary | ICD-10-CM

## 2013-12-20 DIAGNOSIS — I10 Essential (primary) hypertension: Secondary | ICD-10-CM

## 2013-12-20 DIAGNOSIS — I472 Ventricular tachycardia: Secondary | ICD-10-CM
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
## 2013-12-20 NOTE — Patient Instructions (Signed)
Continue all current medications.    Follow up in 2-3 months.

## 2014-02-12 ENCOUNTER — Encounter: Payer: Self-pay | Admitting: Gastroenterology

## 2014-02-12 ENCOUNTER — Ambulatory Visit (INDEPENDENT_AMBULATORY_CARE_PROVIDER_SITE_OTHER): Payer: Medicare Other | Admitting: Gastroenterology

## 2014-02-12 VITALS — BP 134/76 | HR 87 | Temp 97.1°F | Ht 61.0 in | Wt 165.2 lb

## 2014-02-12 DIAGNOSIS — K2970 Gastritis, unspecified, without bleeding: Secondary | ICD-10-CM

## 2014-02-12 DIAGNOSIS — K222 Esophageal obstruction: Secondary | ICD-10-CM

## 2014-02-12 DIAGNOSIS — R195 Other fecal abnormalities: Secondary | ICD-10-CM

## 2014-02-12 DIAGNOSIS — B9681 Helicobacter pylori [H. pylori] as the cause of diseases classified elsewhere: Secondary | ICD-10-CM

## 2014-02-12 NOTE — Progress Notes (Signed)
ON RECALL LIST  °

## 2014-02-12 NOTE — Assessment & Plan Note (Addendum)
MOST LIKELY DUE TO LACTOSE INTOLERANCE, LESS LIKELY C DIFF COLITIS, SIBO, OR GIARDIASIS. SX IMPROVED ON ABX HAD CRAMPING BUT STOOL NOT AS LOOSE.  CHECK C DIFF PCR AND GIARDIA ANTIGEN. CONSIDER HBT FOR SIBO IF NEGATIVE AVOID DAIRY OR USE LACTASE. IMODIUM QHS TO PREVENT NOCTURNAL STOOLS. REFER TO PAIN MANAGEMENT FOR L ARM PAIN. FOLLOW UP IN 6 MOS.

## 2014-02-12 NOTE — Assessment & Plan Note (Signed)
SX IMPROVED.  CONTINUE TO MONITOR SYMPTOMS. 

## 2014-02-12 NOTE — Patient Instructions (Addendum)
AVOID DAIRY OR USE LACTASE PILLS 1 OR 2 WITH MEALS TO PREVENT LOOSE STOOLS WHEN EATING OR DRINKING DAIRY PRODUCUTS.  SUBMIT STOOL STUDY.  YOU MAY USE IMODIUM 1 PILL AT BEDTIME TO PREVENT LOOSE STOOLS IN THE MIDDLE OF THE NIGHT.  CALL ME IN 1 MONTH TO LET ME KNOW IF YOUR SYMPTOMS ARE NOT BETTER.  FOLLOW UP IN SIX MONTHS.     Lactose Free Diet Lactose is a carbohydrate that is found mainly in milk and milk products, as well as in foods with added milk or whey. Lactose must be digested by the enzyme in order to be used by the body. Lactose intolerance occurs when there is a shortage of lactase. When your body is not able to digest lactose, you may feel sick to your stomach (nausea), bloating, cramping, gas and diarrhea.   Tolerance to lactose varies widely There are many dairy products that may be tolerated better than milk by some people:  The use of cultured dairy products such as yogurt, buttermilk, cottage cheese, and sweet acidophilus milk (Kefir) for lactase-deficient individuals is usually well tolerated. This is because the healthy bacteria help digest lactose.   Lactose-hydrolyzed milk (Lactaid) contains 40-90% less lactose than milk and may also be well tolerated.  SPECIAL NOTES  Lactose is a carbohydrates. The major food source is dairy products. Reading food labels is important. Many products contain lactose even when they are not made from milk. Look for the following words: whey, milk solids, dry milk solids, nonfat dry milk powder. Typical sources of lactose other than dairy products include breads, candies, cold cuts, prepared and processed foods, and commercial sauces and gravies.   All foods must be prepared without milk, cream, or other dairy foods.   Soy milk and lactose-free supplements (LACTASE) may be used as an alternative to milk.    FOOD GROUP ALLOWED/RECOMMENDED AVOID/USE SPARINGLY  BREADS / STARCHES 4 servings or more* Breads and rolls made without milk.  [place], [place], or [place] bread. Breads and rolls that contain milk. Prepared mixes such as muffins, biscuits, waffles, pancakes. Sweet rolls, donuts, [place] toast (if made with milk or lactose).  Crackers: Soda crackers, graham crackers. Any crackers prepared without lactose. Zwieback crackers, corn curls, or any that contain lactose.  Cereals: Cooked or dry cereals prepared without lactose (read labels). Cooked or dry cereals prepared with lactose (read labels). Total, Cocoa Krispies. Special K.  Potatoes / Pasta / Rice: Any prepared without milk or lactose. Popcorn. Instant potatoes, frozen [place] fries, scalloped or au gratin potatoes.  VEGETABLES 2 servings or more Fresh, frozen, and canned vegetables. Creamed or breaded vegetables. Vegetables in a cheese sauce or with lactose-containing margarines.  FRUIT 2 servings or more All fresh, canned, or frozen fruits that are not processed with lactose. Any canned or frozen fruits processed with lactose.  MEAT & SUBSTITUTES 2 servings or more (4 to 6 oz. total per day) Plain beef, chicken, fish, [place]key, lamb, veal, pork, or ham. Kosher prepared meat products. Strained or junior meats that do not contain milk. Eggs, soy meat substitutes, nuts. Scrambled eggs, omelets, and souffles that contain milk. Creamed or breaded meat, fish, or fowl. Sausage products such as wieners, liver sausage, or cold cuts that contain milk solids. Cheese, cottage cheese, or cheese spreads.  MILK None. (See "BEVERAGES" for milk substitutes. See "DESSERTS" for ice cream and frozen desserts.) Milk (whole, 2%, skim, or chocolate). Evaporated, powdered, or condensed milk; malted milk.  SOUPS & COMBINATION FOODS Bouillon, broth,  vegetable soups, clear soups, consomms. Homemade soups made with allowed ingredients. Combination or prepared foods that do not contain milk or milk products (read labels). Cream soups, chowders, commercially prepared soups containing lactose. Macaroni and  cheese, pizza. Combination or prepared foods that contain milk or milk products.  DESSERTS & SWEETS In moderation Water and fruit ices; gelatin; angel food cake. Homemade cookies, pies, or cakes made from allowed ingredients. Pudding (if made with water or a milk substitute). Lactose-free tofu desserts. Sugar, honey, corn syrup, jam, jelly; marmalade; molasses (beet sugar); Pure sugar candy; marshmallows. Ice cream, ice milk, sherbet, custard, pudding, frozen yogurt. Commercial cake and cookie mixes. Desserts that contain chocolate. Pie crust made with milk-containing margarine; reduced-calorie desserts made with a sugar substitute that contains lactose. Toffee, peppermint, butterscotch, chocolate, caramels.  FATS & OILS In moderation Butter (as tolerated; contains very small amounts of lactose). Margarines and dressings that do not contain milk, Vegetable oils, shortening, Miracle Whip, mayonnaise, nondairy cream & whipped toppings without lactose or milk solids added (examples: Coffee Rich, Carnation Coffeemate, Rich's Whipped Topping, PolyRich). Tomasa Blase. Margarines and salad dressings containing milk; cream, cream cheese; peanut butter with added milk solids, sour cream, chip dips, made with sour cream.  BEVERAGES Carbonated drinks; tea; coffee and freeze-dried coffee; some instant coffees (check labels). Fruit drinks; fruit and vegetable juice; Rice or Soy milk. Ovaltine, hot chocolate. Some cocoas; some instant coffees; instant iced teas; powdered fruit drinks (read labels).   CONDIMENTS / MISCELLANEOUS Soy sauce, carob powder, olives, gravy made with water, baker's cocoa, pickles, pure seasonings and spices, wine, pure monosodium glutamate, catsup, mustard. Some chewing gums, chocolate, some cocoas. Certain antibiotics and vitamin / mineral preparations. Spice blends if they contain milk products. MSG extender. Artificial sweeteners that contain lactose such as Equal (Nutra-Sweet) and Sweet 'n  Low. Some nondairy creamers (read labels).  SAMPLE MENU*  Breakfast   Orange Juice.  Banana.   Bran flakes.   Nondairy Creamer.  [place] Bread (toasted).   Butter or milk-free margarine.   Coffee or tea.    Noon Meal   Chicken Breast.  Rice.   Green beans.   Butter or milk-free margarine.  Fresh melon.   Coffee or tea.    Evening Meal   Roast Beef.  Baked potato.   Butter or milk-free margarine.   Broccoli.   Lettuce salad with vinegar and oil dressing.  [employer].   Coffee or tea.

## 2014-02-12 NOTE — Assessment & Plan Note (Signed)
DYSPEPSIA IMPROVED AFTER ABX/PROTONIX BID.  CONTINUE TO MONITOR SYMPTOMS.
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
## 2014-02-13 ENCOUNTER — Other Ambulatory Visit: Payer: Self-pay

## 2014-02-13 DIAGNOSIS — Z9889 Other specified postprocedural states: Secondary | ICD-10-CM

## 2014-02-13 DIAGNOSIS — M79601 Pain in right arm: Secondary | ICD-10-CM

## 2014-02-18 NOTE — Progress Notes (Signed)
cc'ed to pcp °

## 2014-02-25 ENCOUNTER — Ambulatory Visit: Payer: Medicare Other | Admitting: Cardiovascular Disease

## 2014-03-27 ENCOUNTER — Telehealth: Payer: Self-pay | Admitting: Gastroenterology

## 2014-03-27 NOTE — Telephone Encounter (Signed)
REVIEWED-NO ADDITIONAL RECOMMENDATIONS. 

## 2014-03-27 NOTE — Telephone Encounter (Signed)
Kelli from Dr Lovell SheehanJenkins' office called today to let [name] know that we had referred patient to see Dr Lovell SheehanJenkins and patient had OV for 1/28 and called to rescheduled and Ov was suppose to have been for today and she was a No Show. Harvin HazelKelli did say that she had called and spoke to someone to remind patient of appointment each time. I told her that I would let SF be aware.

## 2014-04-30 ENCOUNTER — Emergency Department (HOSPITAL_COMMUNITY): Payer: Medicare Other

## 2014-04-30 ENCOUNTER — Emergency Department (HOSPITAL_COMMUNITY)
Admission: EM | Admit: 2014-04-30 | Discharge: 2014-04-30 | Disposition: A | Discharge status: Home / Self Care | Payer: Medicare Other | Attending: Emergency Medicine | Admitting: Emergency Medicine

## 2014-04-30 ENCOUNTER — Encounter (HOSPITAL_COMMUNITY): Payer: Self-pay | Admitting: *Deleted

## 2014-04-30 DIAGNOSIS — Z7902 Long term (current) use of antithrombotics/antiplatelets: Secondary | ICD-10-CM | POA: Insufficient documentation

## 2014-04-30 DIAGNOSIS — Z79899 Other long term (current) drug therapy: Secondary | ICD-10-CM | POA: Insufficient documentation

## 2014-04-30 DIAGNOSIS — Z95810 Presence of automatic (implantable) cardiac defibrillator: Secondary | ICD-10-CM | POA: Diagnosis not present

## 2014-04-30 DIAGNOSIS — Z8619 Personal history of other infectious and parasitic diseases: Secondary | ICD-10-CM | POA: Insufficient documentation

## 2014-04-30 DIAGNOSIS — J449 Chronic obstructive pulmonary disease, unspecified: Secondary | ICD-10-CM | POA: Insufficient documentation

## 2014-04-30 DIAGNOSIS — S0990XA Unspecified injury of head, initial encounter: Secondary | ICD-10-CM | POA: Insufficient documentation

## 2014-04-30 DIAGNOSIS — G8929 Other chronic pain: Secondary | ICD-10-CM

## 2014-04-30 DIAGNOSIS — W1839XA Other fall on same level, initial encounter: Secondary | ICD-10-CM | POA: Insufficient documentation

## 2014-04-30 DIAGNOSIS — I2510 Atherosclerotic heart disease of native coronary artery without angina pectoris: Secondary | ICD-10-CM | POA: Diagnosis not present

## 2014-04-30 DIAGNOSIS — Y998 Other external cause status: Secondary | ICD-10-CM | POA: Diagnosis not present

## 2014-04-30 DIAGNOSIS — S301XXA Contusion of abdominal wall, initial encounter: Secondary | ICD-10-CM

## 2014-04-30 DIAGNOSIS — R51 Headache: Secondary | ICD-10-CM

## 2014-04-30 DIAGNOSIS — I10 Essential (primary) hypertension: Secondary | ICD-10-CM | POA: Diagnosis not present

## 2014-04-30 DIAGNOSIS — Z880 Allergy status to penicillin: Secondary | ICD-10-CM | POA: Insufficient documentation

## 2014-04-30 DIAGNOSIS — Y9389 Activity, other specified: Secondary | ICD-10-CM | POA: Insufficient documentation

## 2014-04-30 DIAGNOSIS — R531 Weakness: Secondary | ICD-10-CM | POA: Diagnosis not present

## 2014-04-30 DIAGNOSIS — R519 Headache, unspecified: Secondary | ICD-10-CM

## 2014-04-30 DIAGNOSIS — S40022A Contusion of left upper arm, initial encounter: Secondary | ICD-10-CM | POA: Insufficient documentation

## 2014-04-30 DIAGNOSIS — Y9289 Other specified places as the place of occurrence of the external cause: Secondary | ICD-10-CM | POA: Insufficient documentation

## 2014-04-30 DIAGNOSIS — Z87891 Personal history of nicotine dependence: Secondary | ICD-10-CM | POA: Insufficient documentation

## 2014-04-30 DIAGNOSIS — W19XXXA Unspecified fall, initial encounter: Secondary | ICD-10-CM

## 2014-04-30 LAB — COMPREHENSIVE METABOLIC PANEL
ALK PHOS: 34 U/L — AB (ref 39–117)
ALT: 28 U/L (ref 0–35)
AST: 30 U/L (ref 0–37)
Albumin: 3.7 g/dL (ref 3.5–5.2)
Anion gap: 5 (ref 5–15)
BUN: 20 mg/dL (ref 6–23)
CO2: 31 mmol/L (ref 19–32)
Calcium: 9.1 mg/dL (ref 8.4–10.5)
Chloride: 104 mmol/L (ref 96–112)
Creatinine, Ser: 1.03 mg/dL (ref 0.50–1.10)
GFR calc Af Amer: 63 mL/min — ABNORMAL LOW (ref >90)
GFR calc non Af Amer: 55 mL/min — ABNORMAL LOW (ref >90)
Glucose, Bld: 100 mg/dL — ABNORMAL HIGH (ref 70–99)
POTASSIUM: 4.5 mmol/L (ref 3.5–5.1)
Sodium: 140 mmol/L (ref 135–145)
Total Bilirubin: 0.7 mg/dL (ref 0.3–1.2)
Total Protein: 6.9 g/dL (ref 6.0–8.3)

## 2014-04-30 LAB — URINALYSIS, ROUTINE W REFLEX MICROSCOPIC
Bilirubin Urine: NEGATIVE
Glucose, UA: NEGATIVE mg/dL
Ketones, ur: NEGATIVE mg/dL
Nitrite: NEGATIVE
PH: 7 (ref 5.0–8.0)
PROTEIN: NEGATIVE mg/dL
Specific Gravity, Urine: <1.005 — ABNORMAL LOW (ref 1.005–1.030)
UROBILINOGEN UA: 0.2 mg/dL (ref 0.0–1.0)

## 2014-04-30 LAB — CBC WITH DIFFERENTIAL/PLATELET
BASOS PCT: 0 % (ref 0–1)
Basophils Absolute: 0 10*3/uL (ref 0.0–0.1)
Eosinophils Absolute: 0.2 10*3/uL (ref 0.0–0.7)
Eosinophils Relative: 3 % (ref 0–5)
HCT: 38.5 % (ref 36.0–46.0)
HEMOGLOBIN: 12.8 g/dL (ref 12.0–15.0)
LYMPHS PCT: 37 % (ref 12–46)
Lymphs Abs: 2.6 10*3/uL (ref 0.7–4.0)
MCH: 30.1 pg (ref 26.0–34.0)
MCHC: 33.2 g/dL (ref 30.0–36.0)
MCV: 90.6 fL (ref 78.0–100.0)
MONOS PCT: 8 % (ref 3–12)
Monocytes Absolute: 0.6 10*3/uL (ref 0.1–1.0)
NEUTROS ABS: 3.7 10*3/uL (ref 1.7–7.7)
NEUTROS PCT: 52 % (ref 43–77)
Platelets: 175 10*3/uL (ref 150–400)
RBC: 4.25 MIL/uL (ref 3.87–5.11)
RDW: 12.4 % (ref 11.5–15.5)
WBC: 7.1 10*3/uL (ref 4.0–10.5)

## 2014-04-30 LAB — URINE MICROSCOPIC-ADD ON

## 2014-04-30 MED ORDER — ACETAMINOPHEN 325 MG PO TABS
650.0000 mg | ORAL_TABLET | Freq: Once | ORAL | Status: AC
  Administered 2014-04-30: 650 mg via ORAL
  Filled 2014-04-30: qty 2

## 2014-04-30 NOTE — Discharge Instructions (Signed)
°Emergency Department Resource Guide °1) Find a Doctor and Pay Out of Pocket °Although you won't have to find out who is covered by your insurance plan, it is a good idea to ask around and get recommendations. You will then need to call the office and see if the doctor you have chosen will accept you as a new patient and what types of options they offer for patients who are self-pay. Some doctors offer discounts or will set up payment plans for their patients who do not have insurance, but you will need to ask so you aren't surprised when you get to your appointment. ° °2) Contact Your Local Health Department °Not all health departments have doctors that can see patients for sick visits, but many do, so it is worth a call to see if yours does. If you don't know where your local health department is, you can check in your phone book. The CDC also has a tool to help you locate your state's health department, and many state websites also have listings of all of their local health departments. ° °3) Find a Walk-in Clinic °If your illness is not likely to be very severe or complicated, you may want to try a walk in clinic. These are popping up all over the country in pharmacies, drugstores, and shopping centers. They're usually staffed by nurse practitioners or physician assistants that have been trained to treat common illnesses and complaints. They're usually fairly quick and inexpensive. However, if you have serious medical issues or chronic medical problems, these are probably not your best option. ° °No Primary Care Doctor: °- Call [facility] at  832-8000 - they can help you locate a primary care doctor that  accepts your insurance, provides certain services, etc. °- Physician Referral Service- 1-800-533-3463 ° °Chronic Pain Problems: °Organization         Address  Phone   Notes  °[facility]  (336) 297-2271 Patients need to be referred by their primary care doctor.  ° °Medication  Assistance: °Organization         Address  Phone   Notes  °[place] [facility] [address] °[place], [place] [address] (336) 641-8030 --Must be a resident of [place] °-- Must have NO insurance coverage whatsoever (no Medicaid/ Medicare, etc.) °-- The pt. MUST have a primary care doctor that directs their care regularly and follows them in the community °  °[facility]  (866) 331-1348   °[employer]  (888) 892-1162   ° °Agencies that provide inexpensive medical care: °Organization         Address  Phone   Notes  °[facility]  (336) 832-8035   °[facility]    (336) 832-7272   °[facility] [address] °[place], [place] [address] (336) 832-4777   °[facility] [address], °[place] (336) 271-4999   °[facility]    (336) 373-0678   °[facility]    (336) 272-1050   °[facility] ° [address], [place] Phone:  (336) 832-4444, Fax:  (336) 832-4440 Hours of Operation:  9 am - 6 pm, M-F.  Also accepts Medicaid/Medicare and self-pay.  °[facility] ° [address], [place] Phone: (336) 832-3150, Fax: (336) [phone]. Hours of Operation:  8:30 am - 5:30 pm, M-F.  Also accepts Medicaid and self-pay.  °[facility] [place] [address], [place] Phone: (336) 878-6027   °[facility] [address], [place], [place] (336)723-1848, Ext. 123 Mondays & Thursdays: 7-9 AM.  First 15 patients are seen on a first come, first serve basis. °  ° °Medicaid-accepting [place] Providers: ° °Organization         Address  Phone   Notes  °[facility] [address], [place] (336) 641-2100 Also accepts self-pay patients.  °[facility] [address], [place] ° (336) 856-9996   °[facility] [address], [place]  (336) 288-8857   °[facility] [address] [place] [address], [place] (336) 299-7000   °Veita Bland [address], [place]  ° (336) 373-1557 Only accepts [place] Access Medicaid patients after they have their name applied to their card.  ° °Self-Pay (no insurance) in [place]: ° °Organization         Address  Phone   Notes  °Sickle Cell Patients, [facility] [address], [place] (336) 832-1970   °[facility] [address], [place] (336) 832-4400   °[facility] ° [address] [place] [address], [place] (336) 992-4800   °[facility]/Dr. Osei-Bonsu ° [address] [place] [address], [place] or [address], [place] (336) 841-8500 Phone number for both [place] and [place] locations is the same.  °[facility] [address], [place] (336) 299-0000   °[facility] [address] [place] [address], [place] or [address] (336) 852-7530 °(336) 878-2260   °[facility] [address], [place] (336) 350-1642, phone; (336) 294-5005, fax Sees patients 1st and 3rd Saturday of every month.  Must not qualify for public or private insurance (i.e. Medicaid, Medicare, [place] Health Choice, Veterans' Benefits) • Household income should be no more than 200% of the poverty level •The clinic cannot treat you if you are pregnant or think you are pregnant • Sexually transmitted diseases are not treated at the clinic.  ° ° °Dental Care: °Organization         Address  Phone  Notes  °[place] [facility] [address], [place] (336) 641-6152 Accepts children up to age 21 who are enrolled in Medicaid or [place] Health Choice; pregnant women with a Medicaid card; and children who have applied for Medicaid or [place] Health Choice, but were declined, whose parents can pay a reduced fee at time of service.  °[place]  [facility] [place]  [address], [place] (336) 641-7733 Accepts children up to age 21 who are enrolled in Medicaid or [place] Health Choice; pregnant women with a Medicaid card; and children who have applied for Medicaid or [place] Health Choice, but were declined, whose parents can pay a reduced fee at time of service.  °[facility] ° [address],  (336) 641-4533 Patients are seen by appointment only. Walk-ins are not accepted. [facility] will see patients 18 years of age and older. °Monday - Tuesday (8am-5pm) °Most Wednesdays (8:30-5pm) °$30 per visit, cash only  °[facility] ° [address], [place] (336) 641-4533 Patients are seen by appointment only. Walk-ins are not accepted. [facility] will see patients 18 years of age and older. °One   Wednesday Evening (Monthly: Volunteer Based).  $30 per visit, cash only  °[facility]  (919) 537-3737 for adults; Children under age 4, call Graduate Pediatric Dentistry at (919) [phone]. Children aged 4-14, please call (919) [phone] to request a pediatric application. ° Dental services are provided in all areas of dental care including fillings, crowns and bridges, complete and partial dentures, implants, gum treatment, root canals, and extractions. Preventive care is also provided. Treatment is provided to both adults and children. °Patients are selected via a lottery and there is often a waiting list. °  °[facility] [address], °[place] ° (336) 763-8833 [email or web address] °  °[facility] [address], [place], [place] (336)723-1848, Ext. 123 Second and Fourth Thursday of each month, opens at 6:30 AM; Clinic ends at 9 AM.  Patients are seen on a first-come first-served basis, and a limited number are seen during each clinic.  ° °[facility] ° [address], [place], [place] (336) 723-7904    Eligibility Requirements °You must have lived in [place], [place], or [place] counties for at least the last three months. °  You cannot be eligible for state or federal sponsored healthcare insurance, including Veterans Administration, Medicaid, or Medicare. °  You generally cannot be eligible for healthcare insurance through your employer.  °  How to apply: °Eligibility screenings are held every Tuesday and Wednesday afternoon from 1:00 pm until 4:00 pm. You do not need an appointment for the interview!  °[facility] [address], [place], [place] 336-631-2330   °[facility]  336-342-8273   °[facility]  336-703-3100   °[facility]  336-570-6415   ° °Behavioral Health Resources in the Community: °Intensive Outpatient Programs °Organization         Address  Phone  Notes  °[facility] [address], [place], [place] 336-878-6098   °[facility] [address], [place], [place] 336-832-9800   °[facility] [address], [place], [place] ° 336-882-2125   °[facility] [address],  °[place], [place] 1-800-853-5163 or 336-641-4981   °Substance Abuse Resources °Organization         Address  Phone  Notes  °[facility]  336-882-2125   °[facility]  336-784-9470   °[facility]  336-285-9073   °[facility]  336-845-3988   °Residential & Outpatient Substance Abuse Program  1-800-659-3381   °Psychological Services °Organization         Address  Phone  Notes  °[facility]  336- 832-9600   °[facility]  336- 378-7881   °[facility] [address], [place] 1-800-853-5163 or 336-641-4981   ° °Mobile Crisis Teams °Organization         Address  Phone  Notes  °[facility], Mobile Crisis Care Unit  1-877-626-1772   °Assertive °[facility] ° [address]  [place], [place] 336-834-9664   °Sharon DeEsch [address] °[place] 336-554-5454   ° °Self-Help/Support Groups °Organization         Address  Phone             Notes  °[facility]  - variety of support groups  336- 373-1402 Call for more information  °Narcotics Anonymous (NA), [facility] [address], °[place]  2 meetings at this location  ° °  Residential Treatment Programs °Organization         Address  Phone  Notes  °[facility] [address],    °[place]  1-866-801-8205   °[facility] ° [address], [place], [place] 704-293-8524   °[facility] [address], [place] 336-845-3988 Admissions: 8am-3pm M-F  °[facility] [address]    °[place], [place] 336-841-1104   °[facility] [address], [place], [place] 336-379-7146   °[facility] [address]  °[place], [place] 336-285-9073   °[facility] - Intensive Outpatient [address], [place], [place] 336-852-3033   °[facility] [address]  °[place], [place] 1-877-615-2722 or 336-784-9470   °[facility] [address] [place], [place] 336-227-7417 Accepts Medicaid  °[facility] [address]  °[place] 1-800-659-3381 Substance Abuse/Addiction Treatment  ° °[place] Behavioral Health Resources °Organization         Address  Phone  Notes  °[facility]  (888) 581-9988   °Julie Brannon, PhD [address] [place], [place]   (336) 349-5553 or (336) 951-0000   °[facility]   [address] °[place], [place] (336) 349-4454   °[facility] [address], [place], [place] (336) 342-8316 Insurance/Medicaid/sponsorship through [facility]  °[facility] [address]                                    [place], [place] (336) 342-8316 Therapy/tele-psych/case    °[facility] [address]  ° [place], [place] (336) 349-2233    °Dr. Arfeen  (336) 349-4544   °[facility] [place]  [employer] [place] [facility] 1) [address], [place] °2) [address], [place] °3)  [address], [place] (336) 349-3220 °(336) 342-7768 ° °(336) 342-8140   °[place] [facility] (336) 342-1394 or (336) 342-3537 (After Hours)    ° ° °
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
## 2014-04-30 NOTE — ED Notes (Signed)
Pt states she fell two nights ago due to dizziness. Large amount of bruising to left lower back and flank area.

## 2014-05-06 ENCOUNTER — Ambulatory Visit: Payer: Medicare Other | Admitting: Cardiovascular Disease

## 2014-06-02 ENCOUNTER — Encounter: Payer: Self-pay | Admitting: Cardiovascular Disease

## 2014-06-02 ENCOUNTER — Ambulatory Visit (INDEPENDENT_AMBULATORY_CARE_PROVIDER_SITE_OTHER): Payer: Medicare Other | Admitting: Cardiovascular Disease

## 2014-06-02 VITALS — BP 147/86 | HR 90 | Ht 61.0 in | Wt 163.0 lb

## 2014-06-02 DIAGNOSIS — I472 Ventricular tachycardia: Secondary | ICD-10-CM | POA: Diagnosis not present

## 2014-06-02 DIAGNOSIS — I25118 Atherosclerotic heart disease of native coronary artery with other forms of angina pectoris: Secondary | ICD-10-CM

## 2014-06-02 DIAGNOSIS — E785 Hyperlipidemia, unspecified: Secondary | ICD-10-CM | POA: Diagnosis not present

## 2014-06-02 DIAGNOSIS — R29898 Other symptoms and signs involving the musculoskeletal system: Secondary | ICD-10-CM

## 2014-06-02 DIAGNOSIS — I10 Essential (primary) hypertension: Secondary | ICD-10-CM

## 2014-06-02 DIAGNOSIS — Z95810 Presence of automatic (implantable) cardiac defibrillator: Secondary | ICD-10-CM

## 2014-06-02 DIAGNOSIS — Z5181 Encounter for therapeutic drug level monitoring: Secondary | ICD-10-CM

## 2014-06-02 DIAGNOSIS — R5383 Other fatigue: Secondary | ICD-10-CM | POA: Diagnosis not present

## 2014-06-02 DIAGNOSIS — I4729 Other ventricular tachycardia: Secondary | ICD-10-CM

## 2014-06-02 DIAGNOSIS — R0609 Other forms of dyspnea: Secondary | ICD-10-CM

## 2014-06-02 MED ORDER — CLOPIDOGREL BISULFATE 75 MG PO TABS: 75.0000 mg | ORAL_TABLET | Freq: Every day | ORAL | Status: DC

## 2014-06-02 MED ORDER — LISINOPRIL 5 MG PO TABS: 5.0000 mg | ORAL_TABLET | Freq: Every day | ORAL | Status: DC
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
## 2014-06-02 NOTE — Patient Instructions (Signed)
   Stop Ranexa   Change Lisinopril to 5mg  daily, not as needed.  - [facility]  [facility] your Plav[facility].  Continue all other medications.   Lab for Lipids - Reminder:  Nothing to eat or drink after 12 midnight prior to labs. Office will contact with results via phone or letter.   Follow up in  4 months

## 2014-07-22 ENCOUNTER — Encounter: Payer: Self-pay | Admitting: Gastroenterology

## 2014-08-08 ENCOUNTER — Encounter: Payer: Self-pay | Admitting: Internal Medicine

## 2014-08-08 ENCOUNTER — Ambulatory Visit (INDEPENDENT_AMBULATORY_CARE_PROVIDER_SITE_OTHER): Payer: Medicare Other | Admitting: Internal Medicine

## 2014-08-08 VITALS — BP 105/72 | HR 114 | Ht 61.0 in | Wt 161.0 lb

## 2014-08-08 DIAGNOSIS — Z720 Tobacco use: Secondary | ICD-10-CM | POA: Diagnosis not present

## 2014-08-08 DIAGNOSIS — I2510 Atherosclerotic heart disease of native coronary artery without angina pectoris: Secondary | ICD-10-CM | POA: Diagnosis not present

## 2014-08-08 DIAGNOSIS — I472 Ventricular tachycardia: Secondary | ICD-10-CM

## 2014-08-08 DIAGNOSIS — F17200 Nicotine dependence, unspecified, uncomplicated: Secondary | ICD-10-CM

## 2014-08-08 DIAGNOSIS — I255 Ischemic cardiomyopathy: Secondary | ICD-10-CM | POA: Insufficient documentation

## 2014-08-08 DIAGNOSIS — I4729 Other ventricular tachycardia: Secondary | ICD-10-CM

## 2014-08-08 NOTE — Patient Instructions (Addendum)
Your physician recommends that you continue on your current medications as directed. Please refer to the Current Medication list given to you today. Carelink device check on 11/10/14. Your physician recommends that you schedule a follow-up appointment in: 1 year with Dr. Allred. You will receive a reminder letter in the mail in about 10 months reminding you to call and schedule your appointment. If you don't receive this letter, please contact our office. 
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
## 2014-08-12 LAB — CUP PACEART INCLINIC DEVICE CHECK
Battery Voltage: 3.01 V
Brady Statistic RV Percent Paced: 0.02 %
HIGH POWER IMPEDANCE MEASURED VALUE: 53 Ohm
HighPow Impedance: 69 Ohm
Lead Channel Pacing Threshold Amplitude: 0.75 V
Lead Channel Setting Sensing Sensitivity: 0.6 mV
MDC IDC MSMT LEADCHNL RV IMPEDANCE VALUE: 684 Ohm
MDC IDC MSMT LEADCHNL RV PACING THRESHOLD PULSEWIDTH: 0.4 ms
MDC IDC MSMT LEADCHNL RV SENSING INTR AMPL: 6.25 mV
MDC IDC SESS DTM: 20160701144626
MDC IDC SET LEADCHNL RV PACING AMPLITUDE: 2.5 V
MDC IDC SET LEADCHNL RV PACING PULSEWIDTH: 0.4 ms
MDC IDC SET ZONE DETECTION INTERVAL: 270 ms
Zone Setting Detection Interval: 320 ms
Zone Setting Detection Interval: 330 ms

## 2014-08-13 ENCOUNTER — Encounter: Payer: Self-pay | Admitting: *Deleted

## 2014-09-26 ENCOUNTER — Telehealth: Payer: Self-pay | Admitting: Cardiovascular Disease

## 2014-09-26 NOTE — Telephone Encounter (Signed)
Ok to write letter

## 2014-09-26 NOTE — Telephone Encounter (Signed)
Mrs. [name] called stating that her daughter is going to be put in the [facility] in September. She has to go to court on August 29th. [name] is her daughter Mrs. [name] is wanting to know if Dr. Purvis [name] can write a note in regards to her personal health to give to [name] attorney. If note is written it will be given To the DA Office and it may help to put patient in county prison  instead of [place].  [name] states that due to her health it is almost impossible to drive To [place] every weekend. Please call to verify if note can be written to [phone] or [phone].

## 2014-09-29 ENCOUNTER — Encounter: Payer: Self-pay | Admitting: *Deleted

## 2014-09-29 NOTE — Telephone Encounter (Signed)
Letter done & patient notified via voice mail to pick up.  (See EPIC)

## 2014-10-09 ENCOUNTER — Ambulatory Visit: Payer: Medicare Other | Admitting: Cardiovascular Disease

## 2014-10-10 ENCOUNTER — Other Ambulatory Visit: Payer: Self-pay | Admitting: *Deleted

## 2014-10-10 MED ORDER — NITROGLYCERIN 0.4 MG SL SUBL: 0.4000 mg | SUBLINGUAL_TABLET | SUBLINGUAL | Status: DC | PRN

## 2014-10-16 ENCOUNTER — Telehealth: Payer: Self-pay | Admitting: Gastroenterology

## 2014-10-16 NOTE — Telephone Encounter (Signed)
Patient called this afternoon to make OV and is aware of appointment on 9/28 at 2pm and she asked if we could re-refer her back to Dr Lovell Sheehan. She had been referred to him earlier this year and she didn't keep the appointment. I told her that she would probably need to be seen here first prior to do another referral.

## 2014-10-20 NOTE — Telephone Encounter (Signed)
Called and [facility] for pt to call Dr. Lovell Sheehan office to see if she needs another referral.

## 2014-10-23 ENCOUNTER — Other Ambulatory Visit: Payer: Self-pay

## 2014-10-27 ENCOUNTER — Other Ambulatory Visit: Payer: Self-pay | Admitting: Cardiovascular Disease

## 2014-10-28 NOTE — Telephone Encounter (Signed)
Referral faxed

## 2014-11-05 ENCOUNTER — Telehealth: Payer: Self-pay | Admitting: Gastroenterology

## 2014-11-05 ENCOUNTER — Ambulatory Visit: Payer: Medicare Other | Admitting: Gastroenterology

## 2014-11-05 ENCOUNTER — Encounter: Payer: Self-pay | Admitting: Gastroenterology

## 2014-11-05 NOTE — Telephone Encounter (Signed)
PATIENT WAS A NO SHOW AND LETTER SENT  °

## 2014-11-10 ENCOUNTER — Telehealth: Payer: Self-pay | Admitting: Cardiology

## 2014-11-10 ENCOUNTER — Encounter: Payer: Self-pay | Admitting: Internal Medicine

## 2014-11-10 ENCOUNTER — Ambulatory Visit (INDEPENDENT_AMBULATORY_CARE_PROVIDER_SITE_OTHER): Payer: Medicare Other | Admitting: *Deleted

## 2014-11-10 DIAGNOSIS — I4729 Other ventricular tachycardia: Secondary | ICD-10-CM

## 2014-11-10 DIAGNOSIS — I472 Ventricular tachycardia: Secondary | ICD-10-CM

## 2014-11-10 NOTE — Telephone Encounter (Signed)
Attempted to confirm remote transmission with pt. No answer and was unable to leave a message.   

## 2014-11-11 ENCOUNTER — Encounter: Payer: Self-pay | Admitting: Cardiology

## 2014-11-12 DIAGNOSIS — I472 Ventricular tachycardia: Secondary | ICD-10-CM | POA: Diagnosis not present

## 2014-11-14 NOTE — Progress Notes (Signed)
Remote ICD transmission.   

## 2014-11-19 ENCOUNTER — Ambulatory Visit: Payer: Medicare Other | Admitting: Cardiovascular Disease

## 2014-12-01 LAB — CUP PACEART REMOTE DEVICE CHECK
Brady Statistic RV Percent Paced: 0.03 %
HIGH POWER IMPEDANCE MEASURED VALUE: 57 Ohm
HighPow Impedance: 44 Ohm
Implantable Lead Implant Date: 20041102
Implantable Lead Model: 6949
Lead Channel Impedance Value: 532 Ohm
Lead Channel Sensing Intrinsic Amplitude: 4.5 mV
Lead Channel Setting Pacing Amplitude: 2.5 V
Lead Channel Setting Pacing Pulse Width: 0.4 ms
MDC IDC LEAD LOCATION: 753860
MDC IDC MSMT BATTERY VOLTAGE: 3 V
MDC IDC MSMT LEADCHNL RV SENSING INTR AMPL: 4.5 mV
MDC IDC SESS DTM: 20161006012215
MDC IDC SET LEADCHNL RV SENSING SENSITIVITY: 0.6 mV

## 2014-12-11 ENCOUNTER — Encounter: Payer: Self-pay | Admitting: *Deleted

## 2014-12-16 ENCOUNTER — Ambulatory Visit: Payer: Medicare Other | Admitting: Cardiovascular Disease

## 2014-12-18 ENCOUNTER — Encounter: Payer: Self-pay | Admitting: *Deleted

## 2015-01-08 ENCOUNTER — Ambulatory Visit (INDEPENDENT_AMBULATORY_CARE_PROVIDER_SITE_OTHER): Payer: Medicare Other | Admitting: Cardiovascular Disease

## 2015-01-08 ENCOUNTER — Encounter: Payer: Self-pay | Admitting: Cardiovascular Disease

## 2015-01-08 VITALS — BP 118/71 | HR 102 | Ht 61.0 in | Wt 158.0 lb

## 2015-01-08 DIAGNOSIS — I739 Peripheral vascular disease, unspecified: Secondary | ICD-10-CM

## 2015-01-08 DIAGNOSIS — Z95810 Presence of automatic (implantable) cardiac defibrillator: Secondary | ICD-10-CM | POA: Diagnosis not present

## 2015-01-08 DIAGNOSIS — Z87898 Personal history of other specified conditions: Secondary | ICD-10-CM

## 2015-01-08 DIAGNOSIS — Z9289 Personal history of other medical treatment: Secondary | ICD-10-CM

## 2015-01-08 DIAGNOSIS — I10 Essential (primary) hypertension: Secondary | ICD-10-CM

## 2015-01-08 DIAGNOSIS — I25118 Atherosclerotic heart disease of native coronary artery with other forms of angina pectoris: Secondary | ICD-10-CM | POA: Diagnosis not present

## 2015-01-08 DIAGNOSIS — I4729 Other ventricular tachycardia: Secondary | ICD-10-CM

## 2015-01-08 DIAGNOSIS — E785 Hyperlipidemia, unspecified: Secondary | ICD-10-CM

## 2015-01-08 DIAGNOSIS — I472 Ventricular tachycardia: Secondary | ICD-10-CM

## 2015-01-08 DIAGNOSIS — I255 Ischemic cardiomyopathy: Secondary | ICD-10-CM | POA: Diagnosis not present
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
## 2015-01-08 NOTE — Patient Instructions (Signed)
Continue all current medications. Follow up in  3-4 months.  

## 2015-02-11 ENCOUNTER — Ambulatory Visit (INDEPENDENT_AMBULATORY_CARE_PROVIDER_SITE_OTHER): Payer: Medicare Other | Admitting: *Deleted

## 2015-02-11 DIAGNOSIS — I472 Ventricular tachycardia: Secondary | ICD-10-CM | POA: Diagnosis not present

## 2015-02-11 DIAGNOSIS — I4729 Other ventricular tachycardia: Secondary | ICD-10-CM

## 2015-02-11 NOTE — Progress Notes (Signed)
Remote ICD transmission.   

## 2015-02-22 LAB — CUP PACEART REMOTE DEVICE CHECK
Date Time Interrogation Session: 20170104083628
HighPow Impedance: 45 Ohm
HighPow Impedance: 59 Ohm
Implantable Lead Implant Date: 20041102
Implantable Lead Location: 753860
Lead Channel Sensing Intrinsic Amplitude: 6.5 mV
Lead Channel Sensing Intrinsic Amplitude: 6.5 mV
MDC IDC LEAD MODEL: 6949
MDC IDC MSMT BATTERY VOLTAGE: 2.98 V
MDC IDC MSMT LEADCHNL RV IMPEDANCE VALUE: 494 Ohm
MDC IDC SET LEADCHNL RV PACING AMPLITUDE: 2.5 V
MDC IDC SET LEADCHNL RV PACING PULSEWIDTH: 0.4 ms
MDC IDC SET LEADCHNL RV SENSING SENSITIVITY: 0.6 mV
MDC IDC STAT BRADY RV PERCENT PACED: 0.01 %

## 2015-02-25 ENCOUNTER — Encounter: Payer: Self-pay | Admitting: Cardiology

## 2015-02-25 ENCOUNTER — Encounter (HOSPITAL_COMMUNITY): Payer: Self-pay

## 2015-02-25 ENCOUNTER — Emergency Department (HOSPITAL_COMMUNITY)
Admission: EM | Admit: 2015-02-25 | Discharge: 2015-02-25 | Disposition: A | Discharge status: Home / Self Care | Payer: Medicare Other | Attending: Emergency Medicine | Admitting: Emergency Medicine

## 2015-02-25 ENCOUNTER — Emergency Department (HOSPITAL_COMMUNITY): Payer: Medicare Other

## 2015-02-25 DIAGNOSIS — Z95810 Presence of automatic (implantable) cardiac defibrillator: Secondary | ICD-10-CM | POA: Diagnosis not present

## 2015-02-25 DIAGNOSIS — I2510 Atherosclerotic heart disease of native coronary artery without angina pectoris: Secondary | ICD-10-CM | POA: Insufficient documentation

## 2015-02-25 DIAGNOSIS — Z8619 Personal history of other infectious and parasitic diseases: Secondary | ICD-10-CM | POA: Insufficient documentation

## 2015-02-25 DIAGNOSIS — G4733 Obstructive sleep apnea (adult) (pediatric): Secondary | ICD-10-CM | POA: Insufficient documentation

## 2015-02-25 DIAGNOSIS — Z880 Allergy status to penicillin: Secondary | ICD-10-CM | POA: Diagnosis not present

## 2015-02-25 DIAGNOSIS — Z7902 Long term (current) use of antithrombotics/antiplatelets: Secondary | ICD-10-CM | POA: Insufficient documentation

## 2015-02-25 DIAGNOSIS — I10 Essential (primary) hypertension: Secondary | ICD-10-CM | POA: Insufficient documentation

## 2015-02-25 DIAGNOSIS — Z87891 Personal history of nicotine dependence: Secondary | ICD-10-CM | POA: Diagnosis not present

## 2015-02-25 DIAGNOSIS — R0602 Shortness of breath: Secondary | ICD-10-CM | POA: Diagnosis present

## 2015-02-25 DIAGNOSIS — R000 Tachycardia, unspecified: Secondary | ICD-10-CM | POA: Insufficient documentation

## 2015-02-25 DIAGNOSIS — Z79899 Other long term (current) drug therapy: Secondary | ICD-10-CM | POA: Diagnosis not present

## 2015-02-25 DIAGNOSIS — J40 Bronchitis, not specified as acute or chronic: Secondary | ICD-10-CM

## 2015-02-25 DIAGNOSIS — J441 Chronic obstructive pulmonary disease with (acute) exacerbation: Secondary | ICD-10-CM | POA: Insufficient documentation

## 2015-02-25 LAB — COMPREHENSIVE METABOLIC PANEL
ALK PHOS: 48 U/L (ref 38–126)
ALT: 29 U/L (ref 14–54)
ANION GAP: 8 (ref 5–15)
AST: 50 U/L — ABNORMAL HIGH (ref 15–41)
Albumin: 3.8 g/dL (ref 3.5–5.0)
BILIRUBIN TOTAL: 0.7 mg/dL (ref 0.3–1.2)
BUN: 16 mg/dL (ref 6–20)
CALCIUM: 9.1 mg/dL (ref 8.9–10.3)
CO2: 31 mmol/L (ref 22–32)
Chloride: 99 mmol/L — ABNORMAL LOW (ref 101–111)
Creatinine, Ser: 1.23 mg/dL — ABNORMAL HIGH (ref 0.44–1.00)
GFR calc non Af Amer: 44 mL/min — ABNORMAL LOW (ref >60)
GFR, EST AFRICAN AMERICAN: 51 mL/min — AB (ref >60)
Glucose, Bld: 139 mg/dL — ABNORMAL HIGH (ref 65–99)
Potassium: 3.4 mmol/L — ABNORMAL LOW (ref 3.5–5.1)
Sodium: 138 mmol/L (ref 135–145)
Total Protein: 7.4 g/dL (ref 6.5–8.1)

## 2015-02-25 LAB — CBC
HCT: 41.6 % (ref 36.0–46.0)
HEMOGLOBIN: 13.4 g/dL (ref 12.0–15.0)
MCH: 28.8 pg (ref 26.0–34.0)
MCHC: 32.2 g/dL (ref 30.0–36.0)
MCV: 89.3 fL (ref 78.0–100.0)
Platelets: 197 10*3/uL (ref 150–400)
RBC: 4.66 MIL/uL (ref 3.87–5.11)
RDW: 12.6 % (ref 11.5–15.5)
WBC: 11 10*3/uL — ABNORMAL HIGH (ref 4.0–10.5)

## 2015-02-25 LAB — I-STAT TROPONIN, ED: Troponin i, poc: 0 ng/mL (ref 0.00–0.08)

## 2015-02-25 LAB — TROPONIN I

## 2015-02-25 LAB — LIPASE, BLOOD: LIPASE: 22 U/L (ref 11–51)

## 2015-02-25 MED ORDER — PREDNISONE 50 MG PO TABS
60.0000 mg | ORAL_TABLET | Freq: Once | ORAL | Status: AC
  Administered 2015-02-25: 60 mg via ORAL
  Filled 2015-02-25: qty 1

## 2015-02-25 MED ORDER — PREDNISONE 20 MG PO TABS: 40.0000 mg | ORAL_TABLET | Freq: Every day | ORAL | Status: DC

## 2015-02-25 MED ORDER — AZITHROMYCIN 250 MG PO TABS: 250.0000 mg | ORAL_TABLET | Freq: Every day | ORAL | Status: DC

## 2015-02-25 MED ORDER — HYDROCODONE-HOMATROPINE 5-1.5 MG/5ML PO SYRP: 5.0000 mL | ORAL_SOLUTION | Freq: Four times a day (QID) | ORAL | Status: DC | PRN

## 2015-02-25 MED ORDER — FENTANYL CITRATE (PF) 100 MCG/2ML IJ SOLN
100.0000 ug | Freq: Once | INTRAMUSCULAR | Status: AC
  Administered 2015-02-25: 100 ug via INTRAVENOUS
  Filled 2015-02-25: qty 2

## 2015-02-25 MED ORDER — IPRATROPIUM-ALBUTEROL 0.5-2.5 (3) MG/3ML IN SOLN
3.0000 mL | Freq: Once | RESPIRATORY_TRACT | Status: AC
  Administered 2015-02-25: 3 mL via RESPIRATORY_TRACT
  Filled 2015-02-25: qty 3
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
## 2015-02-25 NOTE — ED Notes (Signed)
Patient reports of shortness and breath and left sided chest pain x 3-4 days. Reports hx of productive cough. Denies fevers. States she has taken last breathing tx last night with no releif. NAD noted.

## 2015-03-01 ENCOUNTER — Other Ambulatory Visit: Payer: Self-pay | Admitting: Cardiovascular Disease

## 2015-03-31 ENCOUNTER — Telehealth: Payer: Self-pay

## 2015-03-31 NOTE — Telephone Encounter (Signed)
Patient referred to [facility] clinic by Dr Johney Frame.  Attempted ICM intro call and mail box is full.  Unable to leave message.

## 2015-04-13 ENCOUNTER — Other Ambulatory Visit: Payer: Self-pay | Admitting: Gastroenterology

## 2015-04-13 ENCOUNTER — Other Ambulatory Visit: Payer: Self-pay | Admitting: Cardiovascular Disease

## 2015-04-14 ENCOUNTER — Ambulatory Visit (INDEPENDENT_AMBULATORY_CARE_PROVIDER_SITE_OTHER): Payer: Medicare Other | Admitting: Cardiovascular Disease

## 2015-04-14 ENCOUNTER — Encounter: Payer: Self-pay | Admitting: Cardiovascular Disease

## 2015-04-14 VITALS — BP 148/74 | HR 104 | Ht 61.0 in | Wt 151.0 lb

## 2015-04-14 DIAGNOSIS — I25118 Atherosclerotic heart disease of native coronary artery with other forms of angina pectoris: Secondary | ICD-10-CM

## 2015-04-14 DIAGNOSIS — I739 Peripheral vascular disease, unspecified: Secondary | ICD-10-CM

## 2015-04-14 DIAGNOSIS — E785 Hyperlipidemia, unspecified: Secondary | ICD-10-CM

## 2015-04-14 DIAGNOSIS — Z95810 Presence of automatic (implantable) cardiac defibrillator: Secondary | ICD-10-CM

## 2015-04-14 DIAGNOSIS — I10 Essential (primary) hypertension: Secondary | ICD-10-CM | POA: Diagnosis not present

## 2015-04-14 DIAGNOSIS — I472 Ventricular tachycardia: Secondary | ICD-10-CM

## 2015-04-14 DIAGNOSIS — I4729 Other ventricular tachycardia: Secondary | ICD-10-CM

## 2015-04-14 MED ORDER — CARVEDILOL 25 MG PO TABS: 25.0000 mg | ORAL_TABLET | Freq: Two times a day (BID) | ORAL | Status: DC

## 2015-04-14 MED ORDER — LISINOPRIL 10 MG PO TABS
10.0000 mg | ORAL_TABLET | Freq: Every day | ORAL | Status: DC
Start: 2015-04-14 — End: 2015-09-03

## 2015-04-14 MED ORDER — CLOPIDOGREL BISULFATE 75 MG PO TABS: 75.0000 mg | ORAL_TABLET | Freq: Every day | ORAL | Status: DC

## 2015-04-14 NOTE — Patient Instructions (Signed)
   Increase Lisinopril to 10mg  daily.  Coreg & Plavix also refilled today.   All prescriptions sent to [facility] today for 90 day supply. Continue all other medications.   Your physician wants you to follow up in: 6 months.  You will receive a reminder letter in the mail one-two months in advance.  If you don't receive a letter, please call our office to schedule the follow up appointment
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
## 2015-04-15 NOTE — Telephone Encounter (Signed)
Can we double check with patient? Last OV note said she was on pantoprazole not omeprazole.

## 2015-04-15 NOTE — Telephone Encounter (Signed)
Pt said she is on Pantoprazole bid.

## 2015-04-17 MED ORDER — PANTOPRAZOLE SODIUM 40 MG PO TBEC: 40.0000 mg | DELAYED_RELEASE_TABLET | Freq: Two times a day (BID) | ORAL | Status: DC

## 2015-04-17 NOTE — Addendum Note (Signed)
Addended by: Tiffany KocherLEWIS, Shenetta Schnackenberg S on: 04/17/2015 02:00 PM   Modules accepted: Orders

## 2015-04-23 NOTE — Telephone Encounter (Signed)
Spoke with patient and explained ICM program.  Explained she was referred by Dr Johney FrameAllred and home monitoring would be monthly.  She agreed to monthly ICM follow up.  ICM remote transmission is scheduled for 05/13/2015.

## 2015-05-13 ENCOUNTER — Telehealth: Payer: Self-pay

## 2015-05-13 ENCOUNTER — Ambulatory Visit (INDEPENDENT_AMBULATORY_CARE_PROVIDER_SITE_OTHER): Payer: Medicare Other | Admitting: *Deleted

## 2015-05-13 DIAGNOSIS — I255 Ischemic cardiomyopathy: Secondary | ICD-10-CM | POA: Diagnosis not present

## 2015-05-13 DIAGNOSIS — Z95810 Presence of automatic (implantable) cardiac defibrillator: Secondary | ICD-10-CM

## 2015-05-13 NOTE — Telephone Encounter (Signed)
Remote ICM transmission received.  Attempted patient call and voice mail box full, unable to leave message.

## 2015-05-13 NOTE — Progress Notes (Signed)
EPIC Encounter for ICM Monitoring  Patient Name: [name] is a 70 y.o. female Date: 05/13/2015 Primary Care Physican: No PCP Per Patient Primary Cardiologist: Purvis SheffieldKoneswaran Electrophysiologist: Allred Dry Weight: unknown   In the past month, have you:  1. Gained more than 2 pounds in a day or more than 5 pounds in a week? N/A  2. Had changes in your medications (with verification of current medications)? N/A  3. Had more shortness of breath than is usual for you? N/A  4. Limited your activity because of shortness of breath? N/A  5. Not been able to sleep because of shortness of breath? N/A  6. Had increased swelling in your feet or ankles? N/A  7. Had symptoms of dehydration (dizziness, dry mouth, increased thirst, decreased urine output) N/A  8. Had changes in sodium restriction? N/A  9. Been compliant with medication? N/A   ICM trend: 3 month view for 05/13/2015   ICM trend: 1 year view for 05/13/2015   Follow-up plan: ICM clinic phone appointment on  05/25/2015.  Attempted call to patient and unable to reach.  Transmission reviewed.  Thoracic impedance below reference line from 04/25/2015 to 05/13/2015 suggesting fluid accumulation.    Repeat transmission 05/25/2015 since unable to reach patient for follow up on fluid accumulation.    Copy of note sent to patient's primary care physician, primary cardiologist, and device following physician.  Karie SodaLaurie S Tej Murdaugh, RN, CCM 05/13/2015 1:00 PM

## 2015-05-15 ENCOUNTER — Encounter: Payer: Self-pay | Admitting: Cardiology

## 2015-05-18 ENCOUNTER — Ambulatory Visit (INDEPENDENT_AMBULATORY_CARE_PROVIDER_SITE_OTHER): Payer: Medicare Other | Admitting: *Deleted

## 2015-05-18 ENCOUNTER — Telehealth: Payer: Self-pay

## 2015-05-18 DIAGNOSIS — Z95810 Presence of automatic (implantable) cardiac defibrillator: Secondary | ICD-10-CM

## 2015-05-18 DIAGNOSIS — I255 Ischemic cardiomyopathy: Secondary | ICD-10-CM

## 2015-05-18 DIAGNOSIS — I472 Ventricular tachycardia: Secondary | ICD-10-CM | POA: Diagnosis not present

## 2015-05-18 DIAGNOSIS — I4729 Other ventricular tachycardia: Secondary | ICD-10-CM

## 2015-05-18 NOTE — Progress Notes (Signed)
Remote ICD transmission.   

## 2015-05-18 NOTE — Telephone Encounter (Signed)
Attempted ICM remote transmission call.  Voice mail box full and unable to leave message.  ICM transmission on 05/17/2015 showed thoracic impedance tending back toward reference line.  Will attempt to follow up on 05/25/2015 scheduled ICM remote.    ICM trend: 05/17/2015

## 2015-05-25 ENCOUNTER — Ambulatory Visit (INDEPENDENT_AMBULATORY_CARE_PROVIDER_SITE_OTHER): Payer: Medicare Other

## 2015-05-25 ENCOUNTER — Telehealth: Payer: Self-pay

## 2015-05-25 DIAGNOSIS — I255 Ischemic cardiomyopathy: Secondary | ICD-10-CM

## 2015-05-25 DIAGNOSIS — Z95810 Presence of automatic (implantable) cardiac defibrillator: Secondary | ICD-10-CM

## 2015-05-25 NOTE — Telephone Encounter (Signed)
Remote ICM transmission received.  Attempted patient call and left message for return call.   

## 2015-05-25 NOTE — Progress Notes (Signed)
EPIC Encounter for ICM Monitoring  Patient Name: [name] SpatesMickey H Tseng is a 70 y.o. female Date: 05/25/2015 Primary Care Physican: No PCP Per Patient Primary Cardiologist: Purvis SheffieldKoneswaran Electrophysiologist: Allred Dry Weight: unknown   In the past month, have you:  1. Gained more than 2 pounds in a day or more than 5 pounds in a week? N/A  2. Had changes in your medications (with verification of current medications)? N/A  3. Had more shortness of breath than is usual for you? N/A  4. Limited your activity because of shortness of breath? N/A  5. Not been able to sleep because of shortness of breath? N/A  6. Had increased swelling in your feet or ankles? N/A  7. Had symptoms of dehydration (dizziness, dry mouth, increased thirst, decreased urine output) N/A  8. Had changes in sodium restriction? N/A  9. Been compliant with medication? N/A   ICM trend: 3 month view for 4/17/20147   ICM trend: 1 year view for 05/25/2015   Follow-up plan: ICM clinic phone appointment on  06/25/2015.  Attempted call to patient and unable to reach, mail box is full and unable to leave message.  Transmission reviewed.  Thoracic impedance below reference line from 04/26/2015 to 05/25/2015 suggesting fluid accumulation.    Unable to reach patient for ICM follow.  No changes at this time.   Office appointment with Dr Johney FrameAllred on 07/15/2015.  Copy of note sent to patient's primary care physician, primary cardiologist, and device following physician.  Karie SodaLaurie S Short, RN, CCM 05/25/2015 3:32 PM

## 2015-06-09 LAB — CUP PACEART REMOTE DEVICE CHECK
Brady Statistic RV Percent Paced: 0.06 %
Date Time Interrogation Session: 20170413031237
HIGH POWER IMPEDANCE MEASURED VALUE: 46 Ohm
HighPow Impedance: 67 Ohm
Implantable Lead Location: 753860
Lead Channel Sensing Intrinsic Amplitude: 5 mV
Lead Channel Setting Sensing Sensitivity: 0.6 mV
MDC IDC LEAD IMPLANT DT: 20041102
MDC IDC LEAD MODEL: 6949
MDC IDC MSMT BATTERY VOLTAGE: 2.97 V
MDC IDC MSMT LEADCHNL RV IMPEDANCE VALUE: 551 Ohm
MDC IDC MSMT LEADCHNL RV SENSING INTR AMPL: 5 mV
MDC IDC SET LEADCHNL RV PACING AMPLITUDE: 2.5 V
MDC IDC SET LEADCHNL RV PACING PULSEWIDTH: 0.4 ms

## 2015-06-17 ENCOUNTER — Encounter: Payer: Self-pay | Admitting: Cardiology

## 2015-06-25 ENCOUNTER — Ambulatory Visit (INDEPENDENT_AMBULATORY_CARE_PROVIDER_SITE_OTHER): Payer: Medicare Other

## 2015-06-25 DIAGNOSIS — Z95810 Presence of automatic (implantable) cardiac defibrillator: Secondary | ICD-10-CM

## 2015-06-25 DIAGNOSIS — I255 Ischemic cardiomyopathy: Secondary | ICD-10-CM | POA: Diagnosis not present

## 2015-06-29 ENCOUNTER — Telehealth: Payer: Self-pay

## 2015-06-29 NOTE — Telephone Encounter (Signed)
Remote ICM transmission received.  Attempted patient call and left message for return call.   

## 2015-06-29 NOTE — Progress Notes (Signed)
EPIC Encounter for ICM Monitoring  Patient Name: [name] is a 70 y.o. female Date: 06/29/2015 Primary Care Physican: No PCP Per Patient Primary Cardiologist: Purvis SheffieldKoneswaran Electrophysiologist: Allred Dry Weight: unknown      In the past month, have you:  1. Gained more than 2 pounds in a day or more than 5 pounds in a week? N/A  2. Had changes in your medications (with verification of current medications)? N/A  3. Had more shortness of breath than is usual for you? N/A  4. Limited your activity because of shortness of breath? N/A  5. Not been able to sleep because of shortness of breath? N/A  6. Had increased swelling in your feet, ankles, legs or stomach area? N/A  7. Had symptoms of dehydration (dizziness, dry mouth, increased thirst, decreased urine output) N/A  8. Had changes in sodium restriction? N/A  9. Been compliant with medication? N/A  ICM trend: 3 month view for 06/26/2015   ICM trend: 1 year view for 06/26/2015   Follow-up plan: ICM clinic phone appointment 07/28/2015.  Office appointment with Dr Johney FrameAllred 07/15/2015.  Attempted call to patient and voice mail box has not been set up.   Unable to reach member since enrollment.  FLUID LEVELS: Optivol thoracic impedance continues to be decreased since February 2017 suggesting fluid accumulation.    Copy of note sent to PCP, Dr Purvis SheffieldKoneswaran and Dr Johney FrameAllred for review.  Unable to reach patient for any recommendations.  Patient has office appointment with Dr Johney FrameAllred 07/15/2015.    Karie SodaLaurie S Short, RN, CCM 06/29/2015 1:07 PM

## 2015-07-01 NOTE — Progress Notes (Signed)
Laqueta LindenSuresh A Koneswaran, MD   SentSheral Flow: Mon Jun 29, 2015 1:57 PM    To: Karie SodaLaurie S Short, RN        Message     Will await Dr. Jenel LucksAllred's evaluation.

## 2015-07-08 ENCOUNTER — Other Ambulatory Visit: Payer: Self-pay | Admitting: Gastroenterology

## 2015-07-10 ENCOUNTER — Encounter: Payer: Medicare Other | Admitting: Internal Medicine

## 2015-07-15 ENCOUNTER — Encounter: Payer: Medicare Other | Admitting: Internal Medicine

## 2015-07-15 ENCOUNTER — Encounter: Payer: Self-pay | Admitting: Internal Medicine

## 2015-07-28 ENCOUNTER — Ambulatory Visit (INDEPENDENT_AMBULATORY_CARE_PROVIDER_SITE_OTHER): Payer: Medicare Other

## 2015-07-28 DIAGNOSIS — Z95810 Presence of automatic (implantable) cardiac defibrillator: Secondary | ICD-10-CM

## 2015-07-28 DIAGNOSIS — I255 Ischemic cardiomyopathy: Secondary | ICD-10-CM | POA: Diagnosis not present

## 2015-07-28 NOTE — Progress Notes (Signed)
EPIC Encounter for ICM Monitoring  Patient Name: [name] is a 70 y.o. female Date: 07/28/2015 Primary Care Physican: No PCP Per Patient Primary Cardiologist: Purvis SheffieldKoneswaran Electrophysiologist: Allred Dry Weight: unknown        In the past month, have you:  1. Gained more than 2 pounds in a day or more than 5 pounds in a week? N/A  2. Had changes in your medications (with verification of current medications)? N/A  3. Had more shortness of breath than is usual for you? N/A  4. Limited your activity because of shortness of breath? N/A  5. Not been able to sleep because of shortness of breath? N/A  6. Had increased swelling in your feet, ankles, legs or stomach area? N/A  7. Had symptoms of dehydration (dizziness, dry mouth, increased thirst, decreased urine output) N/A  8. Had changes in sodium restriction? N/A  9. Been compliant with medication? N/A  ICM trend: 3 month view for 07/28/2015   ICM trend: 1 year view for 07/28/2015   Follow-up plan: ICM clinic phone appointment 08/28/2015.  Attempted call to patient and unable to reach.  Transmission reviewed.  FLUID LEVELS:  Optivol thoracic impedance decreased 07/21/2015 to 07/28/2015 suggesting fluid accumulation.     Karie SodaLaurie S Danelly Hassinger, RN, CCM 07/28/2015 11:44 AM

## 2015-08-28 ENCOUNTER — Telehealth: Payer: Self-pay

## 2015-08-28 ENCOUNTER — Ambulatory Visit (INDEPENDENT_AMBULATORY_CARE_PROVIDER_SITE_OTHER): Payer: Medicare Other

## 2015-08-28 DIAGNOSIS — I255 Ischemic cardiomyopathy: Secondary | ICD-10-CM | POA: Diagnosis not present

## 2015-08-28 DIAGNOSIS — Z95810 Presence of automatic (implantable) cardiac defibrillator: Secondary | ICD-10-CM | POA: Diagnosis not present

## 2015-08-28 NOTE — Progress Notes (Signed)
EPIC Encounter for ICM Monitoring  Patient Name: [name] is a 70 y.o. female Date: 08/28/2015 Primary Care Physican: No PCP Per Patient Primary Cardiologist: [name] Electrophysiologist: [name] Dry Weight: unknown        Attempted patient call and unable to leave message due to mail box is full.  Have not been able to reach patient since enrollment 03/2015.  Repeat ICM transmission 09/02/2015.  Thoracic impedence decreased suggesting fluid accumulation since 08/10/2015. Fluid index > threshold.  LABS: 02/25/2015 Creatinine 1.23, BUN 16, Potassium 3.4, Sodium 138 04/30/2014 Creatinine 1.03, BUN 20, Potassium 4.5, Sodium 140   ICM trend: 08/28/2015    Follow-up plan: ICM clinic phone appointment on 09/02/2015.  Patient overdue to make appointment with Dr [name].  Copy of ICM check sent to primary cardiologist and device physician.   [name] SodaLaurie S Short, RN 08/28/2015 8:43 AM

## 2015-08-28 NOTE — Telephone Encounter (Signed)
Attempted ICM patient call.  Unable to leave message due to mail box is full.

## 2015-09-02 ENCOUNTER — Ambulatory Visit (INDEPENDENT_AMBULATORY_CARE_PROVIDER_SITE_OTHER): Payer: Medicare Other

## 2015-09-02 DIAGNOSIS — Z95810 Presence of automatic (implantable) cardiac defibrillator: Secondary | ICD-10-CM

## 2015-09-02 DIAGNOSIS — I255 Ischemic cardiomyopathy: Secondary | ICD-10-CM

## 2015-09-03 ENCOUNTER — Other Ambulatory Visit: Payer: Self-pay | Admitting: Cardiovascular Disease

## 2015-09-03 ENCOUNTER — Telehealth: Payer: Self-pay

## 2015-09-03 MED ORDER — NITROGLYCERIN 0.4 MG SL SUBL: 0.4000 mg | SUBLINGUAL_TABLET | SUBLINGUAL | 3 refills | Status: DC | PRN

## 2015-09-03 MED ORDER — LISINOPRIL 10 MG PO TABS: 10.0000 mg | ORAL_TABLET | Freq: Every day | ORAL | 3 refills | Status: DC

## 2015-09-03 MED ORDER — FUROSEMIDE 20 MG PO TABS: 20.0000 mg | ORAL_TABLET | Freq: Every day | ORAL | 3 refills | Status: DC

## 2015-09-03 NOTE — Telephone Encounter (Signed)
Refill:  Lisinopril, Lasix & Nitroglycerin.  [facility]

## 2015-09-03 NOTE — Progress Notes (Signed)
EPIC Encounter for ICM Monitoring  Patient Name: [name] is a 70 y.o. female Date: 09/03/2015 Primary Care Physican: No PCP Per Patient Primary Cardiologist: Purvis Sheffield Electrophysiologist: Allred Dry Weight: unknown      Attempted patient call and unable to reach or leave message.   Thoracic impedance showed improvement since last ICM check on 08/28/2015.    ICM trend: 09/02/2015    Follow-up plan: ICM clinic phone appointment on 11/04/2015.  Patient has appointment with Dr Purvis Sheffield on 09/16/2015 and Dr Johney Frame on 10/07/2015.  Copy of ICM check sent to primary cardiologist and device physician.   [name], RN 09/03/2015 11:11 AM

## 2015-09-03 NOTE — Telephone Encounter (Signed)
Remote ICM transmission received.  Attempted patient call and unable to leave message due to voice mail box is full.

## 2015-09-16 ENCOUNTER — Ambulatory Visit (INDEPENDENT_AMBULATORY_CARE_PROVIDER_SITE_OTHER): Payer: Medicare Other | Admitting: Cardiovascular Disease

## 2015-09-16 ENCOUNTER — Encounter: Payer: Self-pay | Admitting: Cardiovascular Disease

## 2015-09-16 VITALS — BP 122/64 | HR 86 | Ht 61.0 in | Wt 147.0 lb

## 2015-09-16 DIAGNOSIS — I10 Essential (primary) hypertension: Secondary | ICD-10-CM

## 2015-09-16 DIAGNOSIS — I25118 Atherosclerotic heart disease of native coronary artery with other forms of angina pectoris: Secondary | ICD-10-CM

## 2015-09-16 DIAGNOSIS — I4729 Other ventricular tachycardia: Secondary | ICD-10-CM

## 2015-09-16 DIAGNOSIS — I472 Ventricular tachycardia: Secondary | ICD-10-CM

## 2015-09-16 DIAGNOSIS — I255 Ischemic cardiomyopathy: Secondary | ICD-10-CM

## 2015-09-16 DIAGNOSIS — Z95810 Presence of automatic (implantable) cardiac defibrillator: Secondary | ICD-10-CM | POA: Diagnosis not present

## 2015-09-16 DIAGNOSIS — E785 Hyperlipidemia, unspecified: Secondary | ICD-10-CM

## 2015-09-16 DIAGNOSIS — I739 Peripheral vascular disease, unspecified: Secondary | ICD-10-CM

## 2015-09-16 MED ORDER — ROSUVASTATIN CALCIUM 20 MG PO TABS: 20.0000 mg | ORAL_TABLET | Freq: Every day | ORAL | 6 refills | Status: DC
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
## 2015-09-16 NOTE — Addendum Note (Signed)
Addended by: Lesle ChrisHILL, Maxima Skelton G on: 09/16/2015 02:58 PM   Modules accepted: Orders

## 2015-09-16 NOTE — Patient Instructions (Signed)
Medication Instructions:  Resume Crestor  daily. Continue all other medications.    Labwork: none  Testing/Procedures: none  Follow-Up: Your physician wants you to follow up in: 6 months.  You will receive a reminder letter in the mail one-two months in advance.  If you don't receive a letter, please call our office to schedule the follow up appointment   Any Other Special Instructions Will Be Listed Below (If Applicable).  If you need a refill on your cardiac medications before your next appointment, please call your pharmacy.

## 2015-10-07 ENCOUNTER — Encounter: Payer: Self-pay | Admitting: Internal Medicine

## 2015-10-07 ENCOUNTER — Ambulatory Visit (INDEPENDENT_AMBULATORY_CARE_PROVIDER_SITE_OTHER): Payer: Medicare Other | Admitting: Internal Medicine

## 2015-10-07 VITALS — BP 158/80 | HR 107 | Ht 61.0 in | Wt 153.0 lb

## 2015-10-07 DIAGNOSIS — F17200 Nicotine dependence, unspecified, uncomplicated: Secondary | ICD-10-CM | POA: Diagnosis not present

## 2015-10-07 DIAGNOSIS — I255 Ischemic cardiomyopathy: Secondary | ICD-10-CM

## 2015-10-07 DIAGNOSIS — I10 Essential (primary) hypertension: Secondary | ICD-10-CM

## 2015-10-07 DIAGNOSIS — Z95810 Presence of automatic (implantable) cardiac defibrillator: Secondary | ICD-10-CM | POA: Diagnosis not present

## 2015-10-07 NOTE — Patient Instructions (Signed)
Medication Instructions:   Increase Lasix to 20mg  twice a day x 3 days only, then back to 20mg  daily.  Continue all other medications.    Labwork:  BMET, BNP - orders given today.  Office will contact with results via phone or letter.    Testing/Procedures: none  Follow-Up: Your physician wants you to follow up in:  1 year.  You will receive a reminder letter in the mail one-two months in advance.  If you don't receive a letter, please call our office to schedule the follow up appointment   Any Other Special Instructions Will Be Listed Below (If Applicable).  Remote monitoring is used to monitor your Pacemaker of ICD from home. This monitoring reduces the number of office visits required to check your device to one time per year. It allows us to keep an eye on the functioning of your device to ensure it is working properly. You are scheduled for a device check from home on 01/06/2016. You may send your transmission at any time that day. If you have a wireless device, the transmission will be sent automatically. After your physician reviews your transmission, you will receive a postcard with your next transmission date.  Low sodium (2gm) restricted diet.   If you need a refill on your cardiac medications before your next appointment, please call your pharmacy.
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
## 2015-10-09 LAB — CUP PACEART INCLINIC DEVICE CHECK
Battery Voltage: 2.9 V
Brady Statistic RV Percent Paced: 0.02 %
HIGH POWER IMPEDANCE MEASURED VALUE: 65 Ohm
HighPow Impedance: 44 Ohm
Implantable Lead Implant Date: 20041102
Lead Channel Impedance Value: 532 Ohm
Lead Channel Pacing Threshold Amplitude: 1 V
Lead Channel Sensing Intrinsic Amplitude: 5 mV
Lead Channel Sensing Intrinsic Amplitude: 5.5 mV
Lead Channel Setting Pacing Amplitude: 2.5 V
Lead Channel Setting Pacing Pulse Width: 0.4 ms
MDC IDC LEAD LOCATION: 753860
MDC IDC LEAD MODEL: 6949
MDC IDC MSMT LEADCHNL RV PACING THRESHOLD PULSEWIDTH: 0.4 ms
MDC IDC SESS DTM: 20170830190436
MDC IDC SET LEADCHNL RV SENSING SENSITIVITY: 0.6 mV

## 2015-10-13 ENCOUNTER — Telehealth: Payer: Self-pay | Admitting: Cardiovascular Disease

## 2015-10-13 NOTE — Telephone Encounter (Signed)
BMP/BNP scanned into chart, will forward to provider

## 2015-10-13 NOTE — Telephone Encounter (Signed)
Patient would like to know lab results.

## 2015-10-14 ENCOUNTER — Telehealth: Payer: Self-pay

## 2015-10-14 NOTE — Telephone Encounter (Signed)
Patient left voice mail requesting return call regarding lab results Call to patient and she was requesting lab results.  Advised she will be notified after labs are reviewed by Dr Johney FrameAllred and if there are any recommendations.  Advised next ICM remote transmission will be 11/04/2015.

## 2015-10-14 NOTE — Telephone Encounter (Signed)
Mrs. Deretha EmoryChambers called the office requesting test results on recent labs.

## 2015-10-14 NOTE — Telephone Encounter (Signed)
Patient informed that lab results have not been reviewed by her doctor yet. Patient advised that she would be contacted when labs are resulted on. Patient verbalized understanding.

## 2015-10-20 ENCOUNTER — Telehealth: Payer: Self-pay

## 2015-10-20 NOTE — Telephone Encounter (Signed)
Attempted ICM call to patient and spoke with daughter, Rosey Batheresa.  She stated her mother was not home.  Left name and number for patient to return call.

## 2015-10-21 ENCOUNTER — Telehealth: Payer: Self-pay

## 2015-10-21 NOTE — Telephone Encounter (Signed)
Attempted call to patient in regards to elevated BNP per Dr Purvis SheffieldKoneswaran and if patient is having any weight gain or SOB.  No answer and voice mail box is full.

## 2015-10-23 NOTE — Telephone Encounter (Signed)
Unable to reach patient for follow and scheduled next ICM Remote transmission for 10/26/2015 to recheck fluid levels.

## 2015-10-26 ENCOUNTER — Ambulatory Visit (INDEPENDENT_AMBULATORY_CARE_PROVIDER_SITE_OTHER): Payer: Medicare Other

## 2015-10-26 DIAGNOSIS — Z95810 Presence of automatic (implantable) cardiac defibrillator: Secondary | ICD-10-CM | POA: Diagnosis not present

## 2015-10-26 DIAGNOSIS — I255 Ischemic cardiomyopathy: Secondary | ICD-10-CM

## 2015-10-26 MED ORDER — FUROSEMIDE 20 MG PO TABS: ORAL_TABLET | ORAL | 3 refills | Status: DC

## 2015-10-26 NOTE — Progress Notes (Signed)
EPIC Encounter for ICM Monitoring  Patient Name: [name] is a 70 y.o. female Date: 10/26/2015 Primary Care Physican: No PCP Per Patient Primary Cardiologist: Purvis SheffieldKoneswaran Electrophysiologist: Allred Dry Weight: 153 lb        Heart Failure questions reviewed, pt asymptomatic.  She denied any fluid symptoms.   Thoracic impedance abnormal suggesting fluid accumulation.  LABS: 10/08/2015 Creatinine 0.87, BUN 12, Potassium 4.1, Sodium 141  8/37/2017 BNP 1996  Recommendations:  Increase Furosemide 20 mg to 2 tablets am x 3 days only and then after 3rd day return to prescribed dose of Furosemide 20 mg 1 tablet daily. She repeated instructions back and verbalized understanding.    She stated she did not have enough Furosemide tablets to take extra and [facility]  Follow-up plan: ICM clinic phone appointment on 10/29/2015 tor recheck fluid levels.  Copy of ICM check sent to primary cardiologist and device physician.   ICM trend: 10/26/2015       Karie SodaLaurie S Short, RN 10/26/2015 9:45 AM

## 2015-10-29 ENCOUNTER — Telehealth: Payer: Self-pay

## 2015-10-29 ENCOUNTER — Telehealth: Payer: Self-pay | Admitting: Internal Medicine

## 2015-10-29 ENCOUNTER — Ambulatory Visit (INDEPENDENT_AMBULATORY_CARE_PROVIDER_SITE_OTHER): Payer: Medicare Other

## 2015-10-29 DIAGNOSIS — I255 Ischemic cardiomyopathy: Secondary | ICD-10-CM

## 2015-10-29 DIAGNOSIS — Z95810 Presence of automatic (implantable) cardiac defibrillator: Secondary | ICD-10-CM

## 2015-10-29 NOTE — Progress Notes (Signed)
EPIC Encounter for ICM Monitoring  Patient Name: [name] is a 70 y.o. female Date: 10/29/2015 Primary Care Physican: No PCP Per Patient Primary Cardiologist:Koneswaran Electrophysiologist: Allred Dry Weight:  unknown       Heart Failure questions reviewed, pt asymptomatic   Thoracic impedance returned to normal after increasing Furosemide x 3 days.  She has returned to prescribed dosage of 20 mg daily.   Recommendations: No changes.   Advised to follow 2000 mg salt diet.   Follow-up plan: ICM clinic phone appointment on 12/01/2015.  Copy of ICM check sent to primary cardiologist and device physician.   ICM trend: 10/29/2015       [name] SodaLaurie S Short, RN 10/29/2015 12:39 PM

## 2015-10-29 NOTE — Telephone Encounter (Signed)
Call to patient and requested she send ICM remote transmission to recheck fluid levels.

## 2015-10-29 NOTE — Telephone Encounter (Signed)
New Message ° °Pt voiced returning nurses call. ° °Please f/u with pt °

## 2015-10-29 NOTE — Telephone Encounter (Signed)
Remote ICM transmission received.  Attempted patient call and left detailed message to return call  

## 2015-11-02 NOTE — Telephone Encounter (Signed)
Spoke with patient.

## 2015-11-04 ENCOUNTER — Ambulatory Visit (INDEPENDENT_AMBULATORY_CARE_PROVIDER_SITE_OTHER): Payer: Medicare Other

## 2015-11-04 DIAGNOSIS — Z95810 Presence of automatic (implantable) cardiac defibrillator: Secondary | ICD-10-CM

## 2015-11-04 DIAGNOSIS — I255 Ischemic cardiomyopathy: Secondary | ICD-10-CM

## 2015-11-04 NOTE — Progress Notes (Signed)
EPIC Encounter for ICM Monitoring  Patient Name: [name] is a 70 y.o. female Date: 11/04/2015 Primary Care Physican: No PCP Per Patient Primary Cardiologist:Koneswaran Electrophysiologist: Allred Dry Weight: unknown       Spoke with patient on 11/02/2015  Thoracic impedance returned to normal on 11/04/2015.  Follow-up plan: ICM clinic phone appointment on 12/01/2015.  Copy of ICM check sent to primary cardiologist and device physician.   ICM trend: 11/04/2015       [name] SodaLaurie S Donnice Nielsen, RN 11/04/2015 8:18 AM

## 2015-11-24 ENCOUNTER — Encounter: Payer: Self-pay | Admitting: Internal Medicine

## 2015-12-01 ENCOUNTER — Ambulatory Visit (INDEPENDENT_AMBULATORY_CARE_PROVIDER_SITE_OTHER): Payer: Medicare Other

## 2015-12-01 DIAGNOSIS — Z95810 Presence of automatic (implantable) cardiac defibrillator: Secondary | ICD-10-CM

## 2015-12-01 DIAGNOSIS — I255 Ischemic cardiomyopathy: Secondary | ICD-10-CM

## 2015-12-03 NOTE — Progress Notes (Signed)
EPIC Encounter for ICM Monitoring  Patient Name: [name] is a 70 y.o. female Date: 12/03/2015 Primary Care Physican: No PCP Per Patient Primary Cardiologist:Koneswaran Electrophysiologist: Allred Dry Weight: 142 lb        Heart Failure questions reviewed, pt asymptomatic.  Thoracic impedance slightly below baseline but trend is improving.  She is taking medication daily as prescribed.    Labs: 10/07/2015 Creatinine 0.87, BUN 12, Potassium 4.1, Sodium 141  Recommendations: No changes.  Advised to limit salt intake to 2000 mg daily.  Encouraged to call for fluid symptoms.    Follow-up plan: ICM clinic phone appointment on 01/06/2016.  Copy of ICM check sent to primary cardiologist and device physician.   ICM trend: 12/01/2015       Karie SodaLaurie S Short, RN 12/03/2015 11:17 AM

## 2015-12-16 IMAGING — CT CT ABD-PELV W/ CM
2 of 4 series · 17 of 46 positions shown, 19 images · IV contrast (Omnipaque 300)
Comparison: None.

CLINICAL DATA: Abdominal pain

EXAM:
CT ABDOMEN AND PELVIS WITH CONTRAST
TECHNIQUE: Multidetector CT imaging of the abdomen and pelvis was performed
using the standard protocol following bolus administration of
intravenous contrast.
CONTRAST:  100mL OMNIPAQUE IOHEXOL 300 MG/ML SOLN, 50mL OMNIPAQUE
IOHEXOL 300 MG/ML SOLN

[Series 2: abd_pel_with 5.0 b40f · axial · 0.73mm/px · z∈[-442,-38]mm · 14 of 91 slices shown, 16 images]
[im 5/91  soft-tissue]
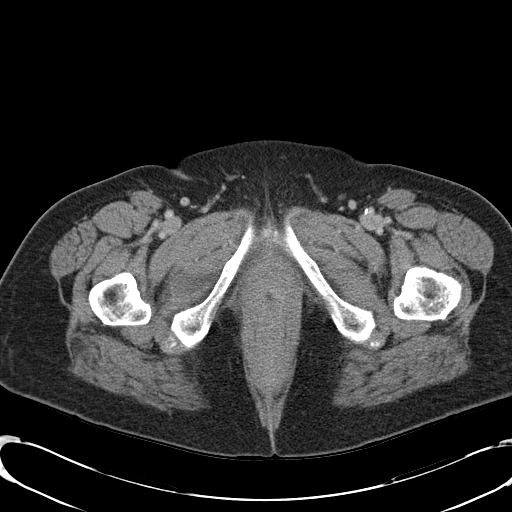
[im 5/91  bone]
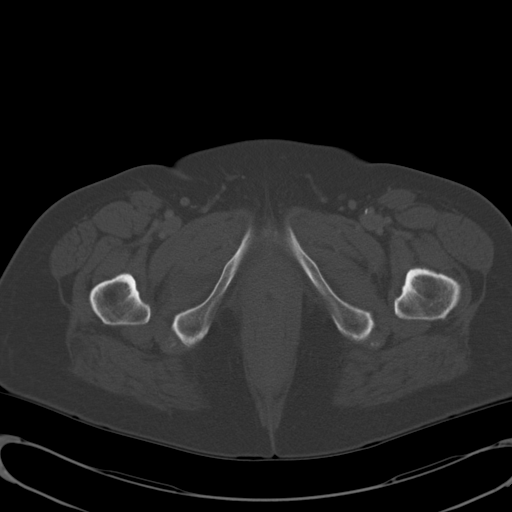
[im 13/91  soft-tissue]
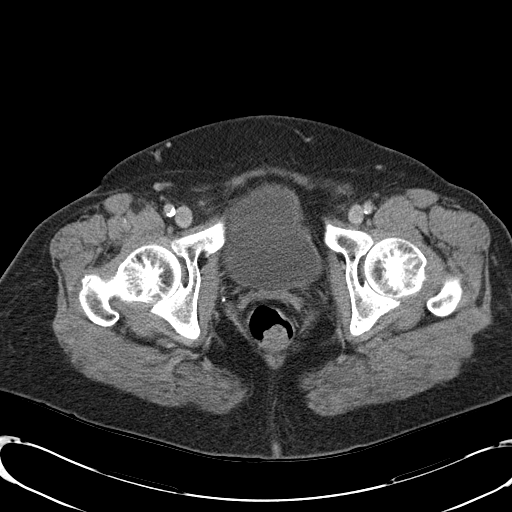
[im 18/91  soft-tissue]
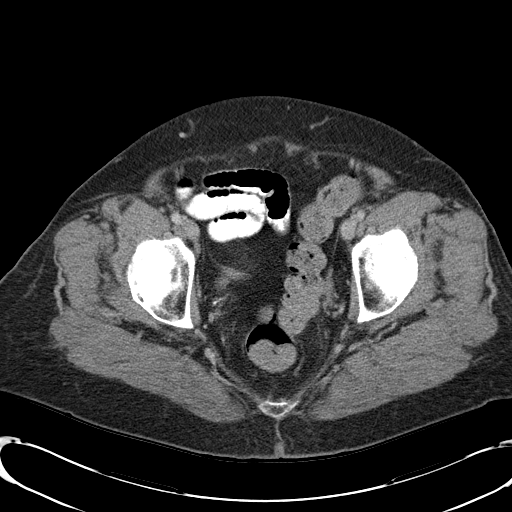
[im 26/91  soft-tissue]
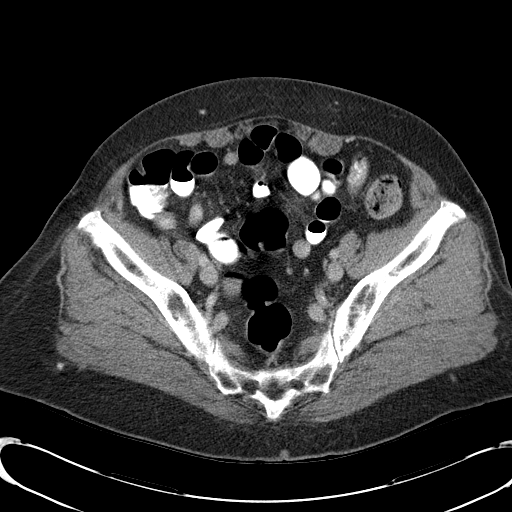
[im 31/91  soft-tissue]
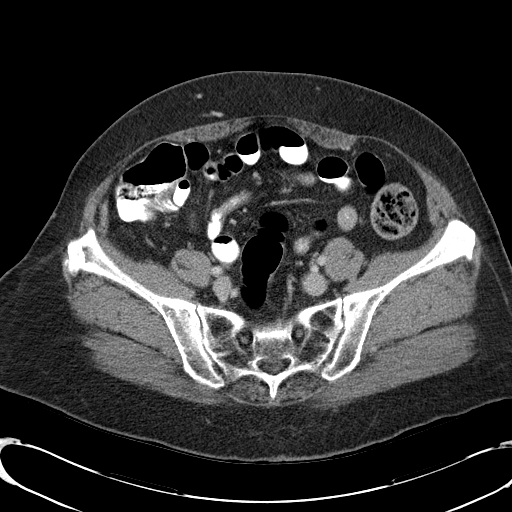
[im 35/91  soft-tissue]
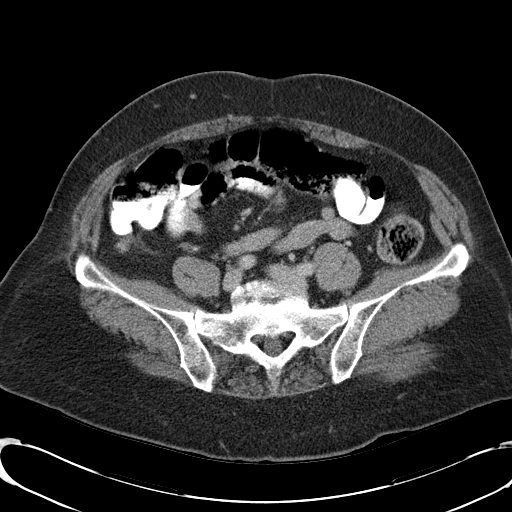
[im 43/91  soft-tissue]
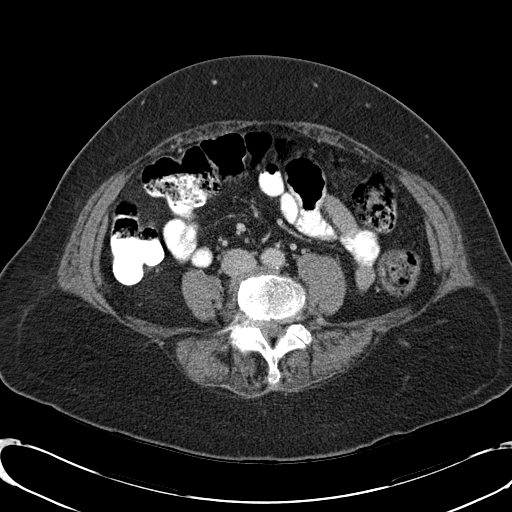
[im 48/91  soft-tissue]
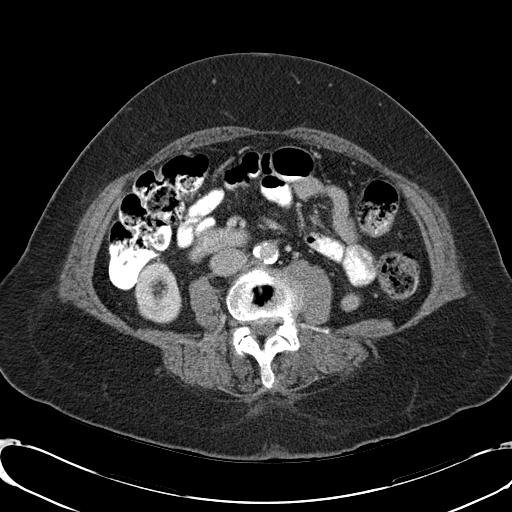
[im 56/91  soft-tissue]
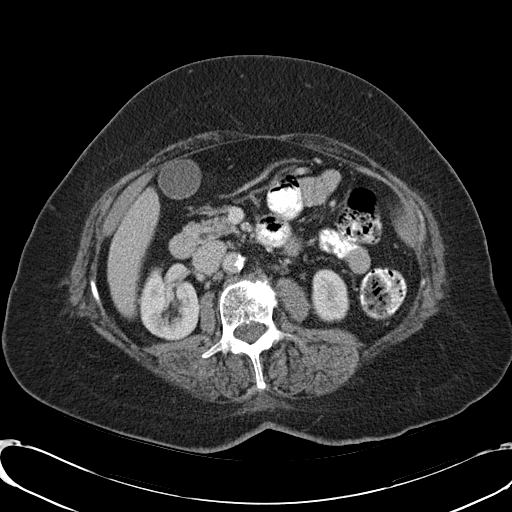
[im 56/91  bone]
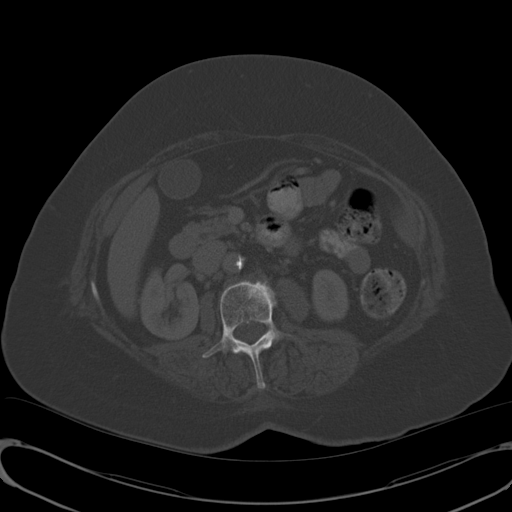
[im 61/91  soft-tissue]
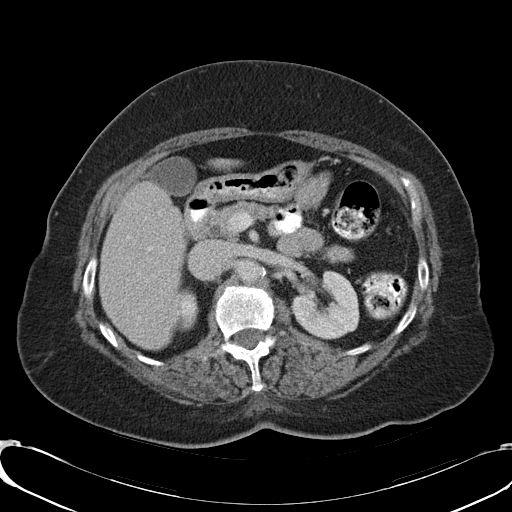
[im 69/91  soft-tissue]
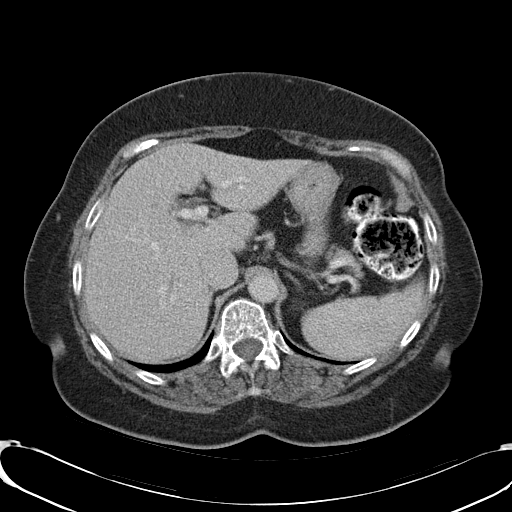
[im 73/91  soft-tissue]
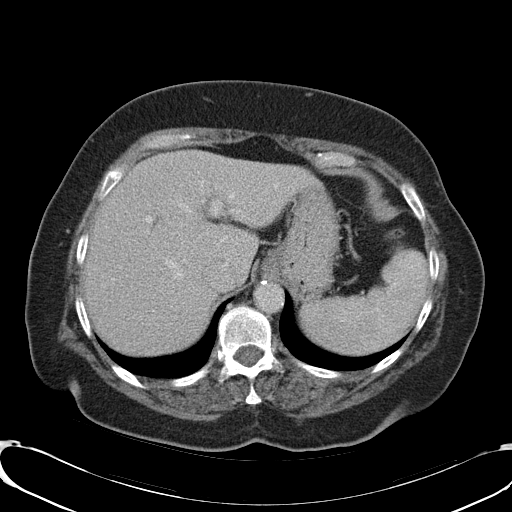
[im 78/91  soft-tissue]
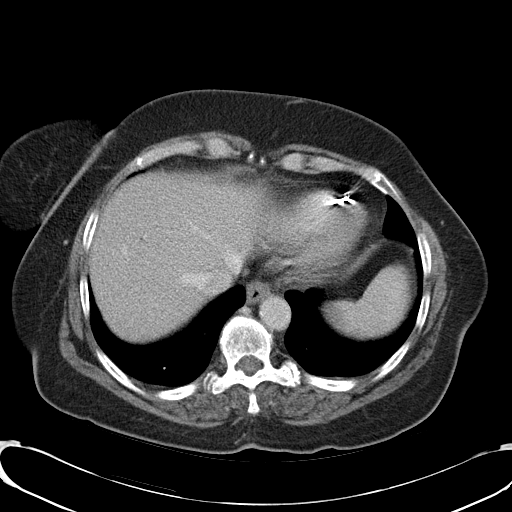
[im 86/91  soft-tissue]
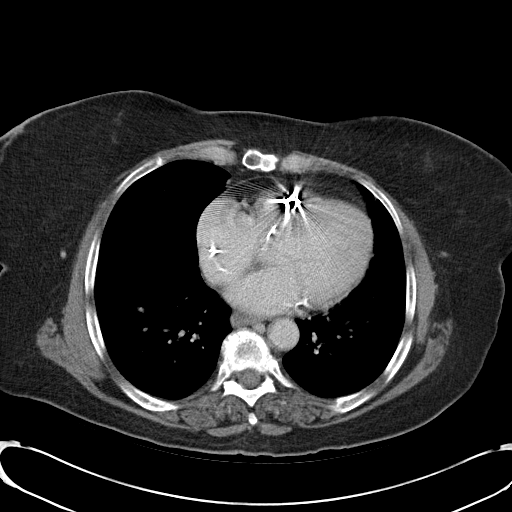

[Series 3: abd_pel_with 3.0 spo · coronal · 0.69mm/px · 3 of 86 slices shown]
[im 29/86  soft-tissue]
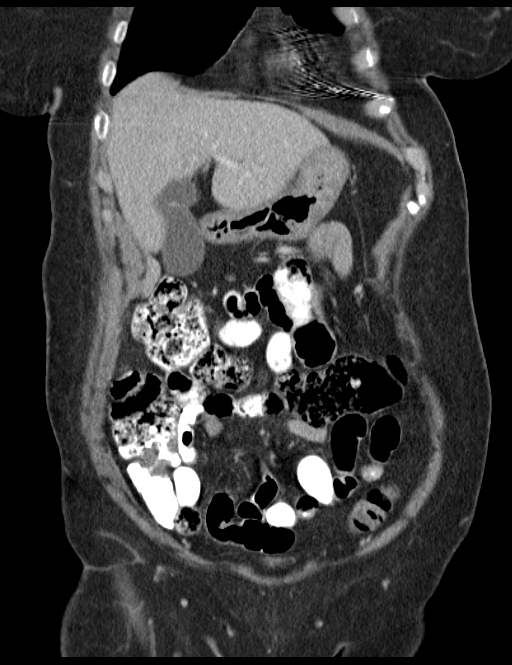
[im 38/86  soft-tissue]
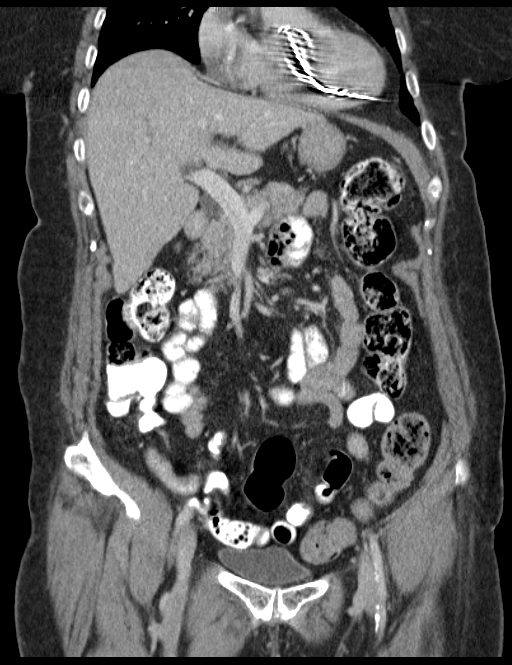
[im 48/86  soft-tissue]
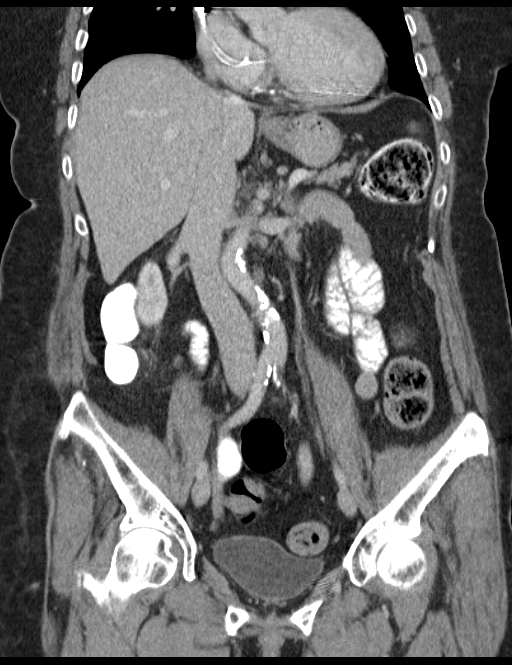

[17 of 46 positions shown; findings below may reference images not displayed]

FINDINGS: Lung bases are clear.  Mild cardiac enlargement.

Liver and gallbladder are normal bile ducts nondilated. Small hiatal
hernia. Pancreas and spleen are normal. Kidneys are normal

Moderate stool throughout the colon. Negative for bowel obstruction
or bowel thickening. Appendix not visualized. No evidence of acute
appendicitis.

Negative for free fluid. Negative for mass or adenopathy. Abdominal
aorta at shows mild atherosclerotic change without aneurysm.
IMPRESSION: No acute abnormality.

## 2016-01-06 ENCOUNTER — Ambulatory Visit (INDEPENDENT_AMBULATORY_CARE_PROVIDER_SITE_OTHER): Payer: Medicare Other | Admitting: *Deleted

## 2016-01-06 ENCOUNTER — Telehealth: Payer: Self-pay | Admitting: Cardiology

## 2016-01-06 DIAGNOSIS — I255 Ischemic cardiomyopathy: Secondary | ICD-10-CM | POA: Diagnosis not present

## 2016-01-06 DIAGNOSIS — Z95810 Presence of automatic (implantable) cardiac defibrillator: Secondary | ICD-10-CM

## 2016-01-06 NOTE — Telephone Encounter (Signed)
Spoke with pt and reminded pt of remote transmission that is due today. Pt verbalized understanding.   

## 2016-01-07 NOTE — Progress Notes (Signed)
EPIC Encounter for ICM Monitoring  Patient Name: [name] is a 70 y.o. female Date: 01/07/2016 Primary Care Physican: No PCP Per Patient Primary Cardiologist:Koneswaran Electrophysiologist: Allred Dry Weight:    unknown       Heart Failure questions reviewed, pt has flu and chest congestion  Thoracic impedance abnormal suggesting fluid accumulation.  Labs: 10/07/2015 Creatinine 0.87, BUN 12, Potassium 4.1, Sodium 141  Recommendations:  Take Furosemide 20 mg 1 tablet twice a day x 3 days and return to prior dosage after 3rd day  Follow-up plan: ICM clinic phone appointment on 01/14/2016 to recheck fluid level.  Copy of ICM check sent to primary cardiologist and device physician.   ICM trend: 01/06/2016       [name] SodaLaurie S Short, RN 01/07/2016 4:09 PM

## 2016-01-08 ENCOUNTER — Encounter: Payer: Self-pay | Admitting: Cardiology

## 2016-01-12 ENCOUNTER — Ambulatory Visit (INDEPENDENT_AMBULATORY_CARE_PROVIDER_SITE_OTHER): Payer: Medicare Other | Admitting: *Deleted

## 2016-01-12 DIAGNOSIS — I255 Ischemic cardiomyopathy: Secondary | ICD-10-CM

## 2016-01-14 ENCOUNTER — Ambulatory Visit (INDEPENDENT_AMBULATORY_CARE_PROVIDER_SITE_OTHER): Payer: Medicare Other

## 2016-01-14 DIAGNOSIS — I255 Ischemic cardiomyopathy: Secondary | ICD-10-CM

## 2016-01-14 DIAGNOSIS — Z95810 Presence of automatic (implantable) cardiac defibrillator: Secondary | ICD-10-CM

## 2016-01-14 NOTE — Progress Notes (Signed)
EPIC Encounter for ICM Monitoring  Patient Name: [name] SpatesMickey H Stille is a 70 y.o. female Date: 01/14/2016 Primary Care Physican: No PCP Per Patient Primary Cardiologist:Koneswaran Electrophysiologist: Allred Dry Weight:    unknown      Heart Failure questions reviewed, pt asymptomatic   Thoracic impedance normal.  Impedance has improved in the last year.    Recommendations: No changes.  Reinforced to limit low salt food choices to 2000 mg day and limiting fluid intake to < 2 liters per day. Encouraged to call for fluid symptoms.    Follow-up plan: ICM clinic phone appointment on 02/12/2016.  Copy of ICM check sent to cardiologist and device physician to show updated normal fluid levels.    ICM trend: 01/14/2016       Karie SodaLaurie S Short, RN 01/14/2016 2:53 PM

## 2016-01-15 NOTE — Progress Notes (Signed)
Remote ICD transmission.   

## 2016-01-20 ENCOUNTER — Encounter: Payer: Self-pay | Admitting: Cardiology

## 2016-01-28 LAB — CUP PACEART REMOTE DEVICE CHECK
Date Time Interrogation Session: 20171205234005
HighPow Impedance: 53 Ohm
HighPow Impedance: 73 Ohm
Implantable Lead Implant Date: 20041102
Implantable Lead Model: 6949
Implantable Pulse Generator Implant Date: 20101021
Lead Channel Sensing Intrinsic Amplitude: 4.875 mV
Lead Channel Sensing Intrinsic Amplitude: 4.875 mV
Lead Channel Setting Pacing Pulse Width: 0.4 ms
MDC IDC LEAD LOCATION: 753860
MDC IDC MSMT BATTERY VOLTAGE: 2.83 V
MDC IDC MSMT LEADCHNL RV IMPEDANCE VALUE: 627 Ohm
MDC IDC SET LEADCHNL RV PACING AMPLITUDE: 2.5 V
MDC IDC SET LEADCHNL RV SENSING SENSITIVITY: 0.6 mV
MDC IDC STAT BRADY RV PERCENT PACED: 0 %

## 2016-02-12 ENCOUNTER — Ambulatory Visit (INDEPENDENT_AMBULATORY_CARE_PROVIDER_SITE_OTHER): Payer: Medicare Other

## 2016-02-12 DIAGNOSIS — I255 Ischemic cardiomyopathy: Secondary | ICD-10-CM | POA: Diagnosis not present

## 2016-02-12 DIAGNOSIS — Z95810 Presence of automatic (implantable) cardiac defibrillator: Secondary | ICD-10-CM | POA: Diagnosis not present

## 2016-02-12 NOTE — Progress Notes (Signed)
EPIC Encounter for ICM Monitoring  Patient Name: [name] is a 71 y.o. female Date: 02/12/2016 Primary Care Physican: No PCP Per Patient Primary Cardiologist:Koneswaran Electrophysiologist: Allred Dry Weight:unknown           Heart Failure questions reviewed, pt asymptomatic but does have a cold for last few days  Thoracic impedance abnormal suggesting fluid accumulation since 01/29/2016.  Labs: 10/07/2015 Creatinine 0.87, BUN 12, Potassium 4.1, Sodium 141  Recommendations:   Increase Furosemide 20 mg to 1 tablet bid x 3 days and after 3rd day return to 1 tablet every morning.  She verbalized understanding.   Follow-up plan: ICM clinic phone appointment on 02/18/2016 to recheck fluid levels.  Copy of ICM check sent to primary cardiologist and device physician.   3 month ICM trend : 02/12/2016   1 Year ICM trend:      Karie SodaLaurie S Short, RN 02/12/2016 7:54 AM

## 2016-02-18 ENCOUNTER — Ambulatory Visit (INDEPENDENT_AMBULATORY_CARE_PROVIDER_SITE_OTHER): Payer: Medicare Other

## 2016-02-18 DIAGNOSIS — Z95810 Presence of automatic (implantable) cardiac defibrillator: Secondary | ICD-10-CM

## 2016-02-18 DIAGNOSIS — I255 Ischemic cardiomyopathy: Secondary | ICD-10-CM

## 2016-02-18 NOTE — Progress Notes (Signed)
EPIC Encounter for ICM Monitoring  Patient Name: Seabron SpatesMickey H Klann is a 71 y.o. female Date: 02/18/2016 Primary Care Physican: No PCP Per Patient Primary Cardiologist:Koneswaran Electrophysiologist: Allred Dry Weight:unknown      Attempted ICM call and unable to reach.   Transmission reviewed.   Thoracic impedance returned to normal after taking 3 days of increased Furosemide.   Labs: 10/07/2015 Creatinine 0.87, BUN 12, Potassium 4.1, Sodium 141  Recommendations: None - unable to reach  Follow-up plan: ICM clinic phone appointment on 03/14/2016.  Copy of ICM check sent to cardiologist and device physician.   3 month ICM trend : 02/18/2016   1 Year ICM trend:      Karie SodaLaurie S Gillis Boardley, RN 02/18/2016 10:06 AM

## 2016-02-19 ENCOUNTER — Telehealth: Payer: Self-pay

## 2016-02-19 NOTE — Telephone Encounter (Signed)
Remote ICM transmission received.  Attempted patient call and mail box is full so no message left.

## 2016-02-19 NOTE — Progress Notes (Addendum)
Patient returned call.  She stated she is doing well and denied any fluid symptoms.  Reviewed transmission and advised fluid accumulation resolved. Next ICM remote transmission 03/14/2016 and encouraged to call for any fluid symptoms.

## 2016-03-14 ENCOUNTER — Other Ambulatory Visit: Payer: Self-pay | Admitting: Cardiovascular Disease

## 2016-03-14 ENCOUNTER — Ambulatory Visit (INDEPENDENT_AMBULATORY_CARE_PROVIDER_SITE_OTHER): Payer: Medicare Other

## 2016-03-14 DIAGNOSIS — Z95810 Presence of automatic (implantable) cardiac defibrillator: Secondary | ICD-10-CM

## 2016-03-14 DIAGNOSIS — I255 Ischemic cardiomyopathy: Secondary | ICD-10-CM | POA: Diagnosis not present

## 2016-03-14 NOTE — Progress Notes (Signed)
EPIC Encounter for ICM Monitoring  Patient Name: [name] is a [age] y.o. female Date: 03/14/2016 Primary Care Physican: No PCP Per Patient Primary Cardiologist:Koneswaran Electrophysiologist: Allred Dry Weight:unknown      Heart Failure questions reviewed, pt asymptomatic   Thoracic impedance normal   Recommendations: No changes. Reminded to limit dietary salt intake to 2000 mg/day and fluid intake to < 2 liters/day. Encouraged to call for fluid symptoms.  Follow-up plan: ICM clinic phone appointment on 04/18/2016.  Copy of ICM check sent to primary cardiologist and device physician.   3 month ICM trend: 03/14/2016   1 Year ICM trend:      [name] SodaLaurie S Dalila Arca, RN 03/14/2016 3:42 PM

## 2016-04-11 ENCOUNTER — Other Ambulatory Visit: Payer: Self-pay | Admitting: *Deleted

## 2016-04-11 MED ORDER — NITROGLYCERIN 0.4 MG SL SUBL: 0.4000 mg | SUBLINGUAL_TABLET | SUBLINGUAL | 0 refills | Status: DC | PRN

## 2016-04-18 ENCOUNTER — Ambulatory Visit (INDEPENDENT_AMBULATORY_CARE_PROVIDER_SITE_OTHER): Payer: Medicare Other | Admitting: *Deleted

## 2016-04-18 ENCOUNTER — Telehealth: Payer: Self-pay

## 2016-04-18 DIAGNOSIS — I4729 Other ventricular tachycardia: Secondary | ICD-10-CM

## 2016-04-18 DIAGNOSIS — I255 Ischemic cardiomyopathy: Secondary | ICD-10-CM

## 2016-04-18 DIAGNOSIS — Z95810 Presence of automatic (implantable) cardiac defibrillator: Secondary | ICD-10-CM

## 2016-04-18 DIAGNOSIS — I472 Ventricular tachycardia: Secondary | ICD-10-CM

## 2016-04-18 NOTE — Progress Notes (Signed)
EPIC Encounter for ICM Monitoring  Patient Name: [name] SpatesMickey H Bailly is a 71 y.o. female Date: 04/18/2016 Primary Care Physican: No PCP Per Patient Primary Cardiologist:Koneswaran Electrophysiologist: Allred Dry Weight:unknown           Attempted call to patient and unable to reach.   Transmission reviewed.    Thoracic impedance abnormal suggesting fluid accumulation since 04/09/2016 and starting to trend up toward baseline.  Prescribed dosage: Furosemide 20 mg 1 tablet every morning  Labs: 10/07/2015 Creatinine 0.87, BUN 12, Potassium 4.1, Sodium 141  Recommendations: NONE - Unable to reach patient   Follow-up plan: ICM clinic phone appointment on 04/25/2016 to recheck fluid levels  Copy of ICM check sent to primary cardiologist and device physician.   3 month ICM trend: 04/18/2016   1 Year ICM trend:      Karie SodaLaurie S Nayah Lukens, RN 04/18/2016 10:53 AM

## 2016-04-18 NOTE — Telephone Encounter (Signed)
Remote ICM transmission received.  Attempted patient call and no answer and mail box is full

## 2016-04-19 LAB — CUP PACEART REMOTE DEVICE CHECK
Battery Voltage: 2.79 V
Brady Statistic RV Percent Paced: 0 %
HIGH POWER IMPEDANCE MEASURED VALUE: 50 Ohm
HIGH POWER IMPEDANCE MEASURED VALUE: 73 Ohm
Implantable Lead Location: 753860
Implantable Lead Model: 6949
Lead Channel Sensing Intrinsic Amplitude: 5.375 mV
Lead Channel Setting Pacing Amplitude: 2.5 V
Lead Channel Setting Pacing Pulse Width: 0.4 ms
Lead Channel Setting Sensing Sensitivity: 0.6 mV
MDC IDC LEAD IMPLANT DT: 20041102
MDC IDC MSMT LEADCHNL RV IMPEDANCE VALUE: 494 Ohm
MDC IDC MSMT LEADCHNL RV SENSING INTR AMPL: 5.375 mV
MDC IDC PG IMPLANT DT: 20101021
MDC IDC SESS DTM: 20180312112209

## 2016-04-19 NOTE — Progress Notes (Signed)
Remote ICD transmission.   

## 2016-04-20 ENCOUNTER — Encounter: Payer: Self-pay | Admitting: Cardiology

## 2016-04-25 ENCOUNTER — Telehealth: Payer: Self-pay | Admitting: Cardiology

## 2016-04-25 NOTE — Telephone Encounter (Signed)
LMOVM reminding pt to send remote transmission.   

## 2016-04-25 NOTE — Telephone Encounter (Signed)
Opened in error

## 2016-04-25 NOTE — Telephone Encounter (Signed)
Pt voicemail is full. Unable to leave a message. Disregard note below.

## 2016-04-26 NOTE — Progress Notes (Signed)
No ICM remote transmission received for 04/25/2016 and next ICM transmission scheduled for 05/12/2016.    

## 2016-05-12 ENCOUNTER — Ambulatory Visit (INDEPENDENT_AMBULATORY_CARE_PROVIDER_SITE_OTHER): Payer: Self-pay

## 2016-05-12 ENCOUNTER — Telehealth: Payer: Self-pay

## 2016-05-12 DIAGNOSIS — I255 Ischemic cardiomyopathy: Secondary | ICD-10-CM

## 2016-05-12 DIAGNOSIS — Z95810 Presence of automatic (implantable) cardiac defibrillator: Secondary | ICD-10-CM

## 2016-05-12 NOTE — Progress Notes (Signed)
EPIC Encounter for ICM Monitoring  Patient Name: [name] is a 71 y.o. female Date: 05/12/2016 Primary Care Physican: No PCP Per Patient Primary Cardiologist:Koneswaran Electrophysiologist: Allred Dry Weight:unknown       Attempted call to patient and unable to reach.  Transmission reviewed.    Thoracic impedance back to baseline.  Prescribed dosage: Furosemide 20 mg 1 tablet every morning  Labs: 10/07/2015 Creatinine 0.87, BUN 12, Potassium 4.1, Sodium 141  Recommendations: NONE - Unable to reach patient   Follow-up plan: ICM clinic phone appointment on 05/31/2016.    Copy of ICM check sent to primary cardiologist and device physician.   3 month ICM trend: 05/12/2016   1 Year ICM trend:      [name], RN 05/12/2016 9:35 AM

## 2016-05-12 NOTE — Telephone Encounter (Signed)
Remote ICM transmission received.  Attempted patient call and mail box is full, unable to leave message. 

## 2016-05-16 ENCOUNTER — Other Ambulatory Visit: Payer: Self-pay | Admitting: Cardiovascular Disease

## 2016-05-31 ENCOUNTER — Ambulatory Visit (INDEPENDENT_AMBULATORY_CARE_PROVIDER_SITE_OTHER): Payer: Medicare Other

## 2016-05-31 DIAGNOSIS — I255 Ischemic cardiomyopathy: Secondary | ICD-10-CM | POA: Diagnosis not present

## 2016-05-31 DIAGNOSIS — Z95810 Presence of automatic (implantable) cardiac defibrillator: Secondary | ICD-10-CM | POA: Diagnosis not present

## 2016-06-01 ENCOUNTER — Telehealth: Payer: Self-pay | Admitting: Cardiology

## 2016-06-01 NOTE — Telephone Encounter (Signed)
Attempted to confirm remote transmission with pt. No answer and was unable to leave a message.   

## 2016-06-02 ENCOUNTER — Telehealth: Payer: Self-pay

## 2016-06-02 NOTE — Telephone Encounter (Signed)
Remote ICM transmission received.  Attempted patient call and mail box is full.  Unable to leave a message.

## 2016-06-02 NOTE — Progress Notes (Signed)
EPIC Encounter for ICM Monitoring  Patient Name: [name] is a 71 y.o. female Date: 06/02/2016 Primary Care Physican: No PCP Per Patient Primary Cardiologist:Koneswaran Electrophysiologist: Allred Dry Weight:unknown       Attempted call to patient and unable to reach or leave a message.  Transmission reviewed.    Thoracic impedance abnormal suggesting fluid accumulation .  Prescribed dosage: Furosemide 20 mg 1 tablet every morning  Labs: 10/07/2015 Creatinine 0.87, BUN 12, Potassium 4.1, Sodium 141  Recommendations: NONE - Unable to reach patient   Follow-up plan: ICM clinic phone appointment on 06/09/2016 to recheck fluid levels.   Copy of ICM check sent to primary cardiologist and device physician.   3 month ICM trend: 06/02/2016   1 Year ICM trend:      [name], RN 06/02/2016 4:21 PM

## 2016-06-09 ENCOUNTER — Telehealth: Payer: Self-pay | Admitting: Cardiology

## 2016-06-09 NOTE — Telephone Encounter (Signed)
Spoke with pt and reminded pt of remote transmission that is due today. Pt verbalized understanding.   

## 2016-06-10 NOTE — Progress Notes (Signed)
No ICM remote transmission received for 06/09/2016 and next ICM transmission scheduled for 07/18/2016.

## 2016-06-16 ENCOUNTER — Other Ambulatory Visit: Payer: Self-pay | Admitting: Cardiovascular Disease

## 2016-06-28 ENCOUNTER — Other Ambulatory Visit: Payer: Self-pay | Admitting: Cardiovascular Disease

## 2016-07-06 ENCOUNTER — Other Ambulatory Visit: Payer: Self-pay | Admitting: Cardiovascular Disease

## 2016-07-18 ENCOUNTER — Telehealth: Payer: Self-pay

## 2016-07-18 ENCOUNTER — Ambulatory Visit (INDEPENDENT_AMBULATORY_CARE_PROVIDER_SITE_OTHER): Payer: Medicare Other | Admitting: *Deleted

## 2016-07-18 DIAGNOSIS — Z95810 Presence of automatic (implantable) cardiac defibrillator: Secondary | ICD-10-CM

## 2016-07-18 DIAGNOSIS — I255 Ischemic cardiomyopathy: Secondary | ICD-10-CM | POA: Diagnosis not present

## 2016-07-18 NOTE — Telephone Encounter (Signed)
Remote ICM transmission received.  Attempted patient call and she stated she was not home and requested a return call tomorrow.

## 2016-07-18 NOTE — Progress Notes (Signed)
Remote ICD transmission.   

## 2016-07-18 NOTE — Progress Notes (Signed)
EPIC Encounter for ICM Monitoring  Patient Name: [name] is a 71 y.o. female Date: 07/18/2016 Primary Care Physican: Patient, No Pcp Per Primary Cardiologist:Koneswaran Electrophysiologist: Allred Dry Weight:unknown                                                 Attempted call to patient and she reported she was not at home and unable to talk.  Transmission reviewed.    Thoracic impedance normal today but was abnormal for 07/12/16 to today.  Prescribed dosage: Furosemide 20 mg 1 tablet every morning  Labs: 10/07/2015 Creatinine 0.87, BUN 12, Potassium 4.1, Sodium 141  Recommendations:  NONE - Unable to reach patient   Follow-up plan: ICM clinic phone appointment on 08/18/2016.  Office appointment scheduled on 08/15/2016 with Dr. Purvis SheffieldKoneswaran.  Copy of ICM check sent to primary cardiologist and device physician.   3 month ICM trend: 07/18/2016   1 Year ICM trend:      [name] SodaLaurie S Cyrstal Leitz, RN 07/18/2016 2:53 PM

## 2016-07-19 LAB — CUP PACEART REMOTE DEVICE CHECK
Battery Voltage: 2.71 V
Date Time Interrogation Session: 20180611071810
HIGH POWER IMPEDANCE MEASURED VALUE: 52 Ohm
HIGH POWER IMPEDANCE MEASURED VALUE: 69 Ohm
Implantable Lead Location: 753860
Implantable Pulse Generator Implant Date: 20101021
Lead Channel Impedance Value: 532 Ohm
Lead Channel Sensing Intrinsic Amplitude: 5.875 mV
Lead Channel Sensing Intrinsic Amplitude: 5.875 mV
Lead Channel Setting Pacing Amplitude: 2.5 V
MDC IDC LEAD IMPLANT DT: 20041102
MDC IDC SET LEADCHNL RV PACING PULSEWIDTH: 0.4 ms
MDC IDC SET LEADCHNL RV SENSING SENSITIVITY: 0.6 mV
MDC IDC STAT BRADY RV PERCENT PACED: 0 %

## 2016-07-20 ENCOUNTER — Encounter: Payer: Self-pay | Admitting: Cardiology

## 2016-07-22 NOTE — Telephone Encounter (Signed)
Attempted call back to patient and no answer.  Mail box is full.

## 2016-07-22 NOTE — Telephone Encounter (Signed)
Asking Jacki ConesLaurie to call her back

## 2016-08-15 ENCOUNTER — Ambulatory Visit: Payer: Medicare Other | Admitting: Cardiovascular Disease

## 2016-08-15 ENCOUNTER — Encounter: Payer: Self-pay | Admitting: Cardiovascular Disease

## 2016-08-18 ENCOUNTER — Telehealth: Payer: Self-pay | Admitting: Cardiology

## 2016-08-18 NOTE — Telephone Encounter (Signed)
Confirmed remote transmission w/ pt daughter.   

## 2016-08-18 NOTE — Telephone Encounter (Signed)
Confirmed remote transmission w/ pt. She is at her daughters house b/c her daughter had knee surgery she will send remote transmission when she gets home on Monday 08-22-16.

## 2016-08-26 NOTE — Progress Notes (Addendum)
No ICM remote transmission received for 08/18/2016 and next ICM transmission scheduled for 09/12/2016.

## 2016-09-01 ENCOUNTER — Other Ambulatory Visit: Payer: Self-pay | Admitting: Cardiovascular Disease

## 2016-09-01 DIAGNOSIS — I255 Ischemic cardiomyopathy: Secondary | ICD-10-CM

## 2016-09-02 ENCOUNTER — Encounter: Payer: Self-pay | Admitting: Internal Medicine

## 2016-09-02 ENCOUNTER — Ambulatory Visit (INDEPENDENT_AMBULATORY_CARE_PROVIDER_SITE_OTHER): Payer: Medicare Other | Admitting: Internal Medicine

## 2016-09-02 ENCOUNTER — Encounter: Payer: Medicare Other | Admitting: Internal Medicine

## 2016-09-02 VITALS — BP 118/48 | HR 90 | Ht 61.0 in | Wt 158.0 lb

## 2016-09-02 DIAGNOSIS — I255 Ischemic cardiomyopathy: Secondary | ICD-10-CM

## 2016-09-02 DIAGNOSIS — I472 Ventricular tachycardia: Secondary | ICD-10-CM | POA: Diagnosis not present

## 2016-09-02 DIAGNOSIS — Z95810 Presence of automatic (implantable) cardiac defibrillator: Secondary | ICD-10-CM | POA: Diagnosis not present

## 2016-09-02 DIAGNOSIS — I2589 Other forms of chronic ischemic heart disease: Secondary | ICD-10-CM

## 2016-09-02 DIAGNOSIS — I4729 Other ventricular tachycardia: Secondary | ICD-10-CM

## 2016-09-02 MED ORDER — CLOPIDOGREL BISULFATE 75 MG PO TABS: 75.0000 mg | ORAL_TABLET | Freq: Every day | ORAL | 1 refills | Status: DC

## 2016-09-02 MED ORDER — CARVEDILOL 25 MG PO TABS: 25.0000 mg | ORAL_TABLET | Freq: Two times a day (BID) | ORAL | 1 refills | Status: DC

## 2016-09-02 MED ORDER — FUROSEMIDE 20 MG PO TABS: 20.0000 mg | ORAL_TABLET | Freq: Every day | ORAL | 1 refills | Status: DC
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
## 2016-09-02 NOTE — Patient Instructions (Addendum)
Medication Instructions:   Your physician recommends that you continue on your current medications as directed. Please refer to the Current Medication list given to you today.  Labwork:  NONE  Testing/Procedures: Your physician has requested that you have an echocardiogram in 6 months just before your visit with Dr. Joyce CopaKonewswaran. Echocardiography is a painless test that uses sound waves to create images of your heart. It provides your doctor with information about the size and shape of your heart and how well your heart's Vonbehren and valves are working. This procedure takes approximately one hour. There are no restrictions for this procedure.  Follow-Up: Your physician recommends that you schedule a follow-up appointment in: 1 year. Please schedule this appointment today before leaving the office. Your physician recommends that you schedule a follow-up appointment in: 6 months with Dr. Purvis SheffieldKoneswaran. You will receive a reminder letter in the mail in about 4 months reminding you to call and schedule your appointment. If you don't receive this letter, please contact our office.   Any Other Special Instructions Will Be Listed Below (If Applicable).  Your next device check from home is on 12/05/16.  If you need a refill on your cardiac medications before your next appointment, please call your pharmacy.

## 2016-09-19 NOTE — Progress Notes (Signed)
No ICM remote transmission received for 09/12/2016 and next ICM transmission scheduled for 10/06/2016.    

## 2016-10-06 ENCOUNTER — Ambulatory Visit (INDEPENDENT_AMBULATORY_CARE_PROVIDER_SITE_OTHER): Payer: Medicare Other

## 2016-10-06 DIAGNOSIS — Z95810 Presence of automatic (implantable) cardiac defibrillator: Secondary | ICD-10-CM

## 2016-10-06 DIAGNOSIS — I255 Ischemic cardiomyopathy: Secondary | ICD-10-CM

## 2016-10-06 NOTE — Progress Notes (Signed)
EPIC Encounter for ICM Monitoring  Patient Name: [name] is a 71 y.o. female Date: 10/06/2016 Primary Care Physican: Patient, No Pcp Per Primary Cardiologist:Koneswaran Electrophysiologist: Allred Dry Weight:unknown      Heart Failure questions reviewed, pt asymptomatic.   Thoracic impedance normal.  Prescribed dosage: Furosemide 20 mg 1 tablet every morning  Labs: 10/07/2015 Creatinine 0.87, BUN 12, Potassium 4.1, Sodium 141  Recommendations: No changes.    Follow-up plan: ICM clinic phone appointment on 11/17/2016.    Copy of ICM check sent to Dr. Johney FrameAllred.   3 month ICM trend: 10/06/2016   1 Year ICM trend:      [name] SodaLaurie S Short, RN 10/06/2016 11:02 AM

## 2016-11-17 ENCOUNTER — Ambulatory Visit (INDEPENDENT_AMBULATORY_CARE_PROVIDER_SITE_OTHER): Payer: Medicare Other

## 2016-11-17 ENCOUNTER — Telehealth: Payer: Self-pay

## 2016-11-17 DIAGNOSIS — I255 Ischemic cardiomyopathy: Secondary | ICD-10-CM | POA: Diagnosis not present

## 2016-11-17 DIAGNOSIS — Z95810 Presence of automatic (implantable) cardiac defibrillator: Secondary | ICD-10-CM | POA: Diagnosis not present

## 2016-11-17 NOTE — Telephone Encounter (Signed)
Remote ICM transmission received.  Attempted call to patient and left message to return call. 

## 2016-11-17 NOTE — Progress Notes (Signed)
EPIC Encounter for ICM Monitoring  Patient Name: [name] is a 71 y.o. female Date: 11/17/2016 Primary Care Physican: Patient, No Pcp Per Primary Cardiologist:Koneswaran Electrophysiologist: Allred Dry Weight:unknown       Attempted call to patient and unable to reach.  Left message to return call regarding transmission.  Transmission reviewed.    Thoracic impedance abnormal suggesting fluid accumulation.  Prescribed dosage: Furosemide 20 mg 1 tablet every morning  Labs: 10/07/2015 Creatinine 0.87, BUN 12, Potassium 4.1, Sodium 141  Recommendations: NONE - Unable to reach patient   Follow-up plan: ICM clinic phone appointment on 11/29/2016.   Copy of ICM check sent to Dr. Johney Frame and Dr. Purvis Sheffield..   3 month ICM trend: 11/17/2016   1 Year ICM trend:      [name], RN 11/17/2016 1:19 PM

## 2016-11-21 ENCOUNTER — Telehealth: Payer: Self-pay

## 2016-11-21 NOTE — Telephone Encounter (Signed)
Attempted ICM call to patient and left message to return call. °

## 2016-11-22 NOTE — Progress Notes (Signed)
LM to return call.

## 2016-11-25 NOTE — Progress Notes (Signed)
LM to return call 10/18 & 10/19 

## 2016-11-29 NOTE — Progress Notes (Signed)
LM to return call.

## 2016-12-01 ENCOUNTER — Encounter: Payer: Self-pay | Admitting: *Deleted

## 2016-12-01 NOTE — Progress Notes (Signed)
No ICM remote transmission received for 11/29/2016 and next ICM transmission scheduled for 12/19/2016.

## 2016-12-01 NOTE — Progress Notes (Signed)
Have left multiple messages with no return call. Will mail patient letter

## 2016-12-19 ENCOUNTER — Telehealth: Payer: Self-pay

## 2016-12-19 ENCOUNTER — Ambulatory Visit (INDEPENDENT_AMBULATORY_CARE_PROVIDER_SITE_OTHER): Payer: Medicare Other | Admitting: *Deleted

## 2016-12-19 DIAGNOSIS — I255 Ischemic cardiomyopathy: Secondary | ICD-10-CM | POA: Diagnosis not present

## 2016-12-19 DIAGNOSIS — Z95810 Presence of automatic (implantable) cardiac defibrillator: Secondary | ICD-10-CM

## 2016-12-19 DIAGNOSIS — I4729 Other ventricular tachycardia: Secondary | ICD-10-CM

## 2016-12-19 DIAGNOSIS — I472 Ventricular tachycardia: Secondary | ICD-10-CM

## 2016-12-19 NOTE — Progress Notes (Signed)
Remote ICD transmission.   

## 2016-12-19 NOTE — Progress Notes (Signed)
Patient returned call.  She stated she is doing fine and transmission reviewed.  No changes and encouraged to call for any fluid symptoms. She does not weigh.

## 2016-12-19 NOTE — Progress Notes (Signed)
EPIC Encounter for ICM Monitoring  Patient Name: [name] is a 71 y.o. female Date: 12/19/2016 Primary Care Physican: Patient, No Pcp Per Primary Cardiologist:Koneswaran Electrophysiologist: Allred Dry Weight:unknown        Attempted call to patient and unable to reach.  Left detailed message regarding transmission.  Transmission reviewed.    Optivol: Thoracic impedance normal.  Prescribed dosage: Furosemide 20 mg 1 tablet every morning  Labs: 10/07/2015 Creatinine 0.87, BUN 12, Potassium 4.1, Sodium 141  Recommendations:  Left voice mail with ICM number and encouraged to call if experiencing any fluid symptoms.  Follow-up plan: ICM clinic phone appointment on 01/19/2017.    Copy of ICM check sent to Dr. Johney FrameAllred.   3 month ICM trend: 12/19/2016    1 Year ICM trend:       Karie SodaLaurie S Khiree Bukhari, RN 12/19/2016 10:58 AM

## 2016-12-19 NOTE — Telephone Encounter (Signed)
Remote ICM transmission received.  Attempted call to patient and left detailed message per DPR regarding transmission and next ICM scheduled for 01/19/2017.  Advised to return call for any fluid symptoms or questions.    

## 2016-12-20 LAB — CUP PACEART REMOTE DEVICE CHECK
Brady Statistic RV Percent Paced: 0.04 %
Date Time Interrogation Session: 20181112093730
HighPow Impedance: 53 Ohm
HighPow Impedance: 70 Ohm
Implantable Lead Implant Date: 20041102
Implantable Lead Model: 6949
Implantable Pulse Generator Implant Date: 20101021
Lead Channel Sensing Intrinsic Amplitude: 5.75 mV
Lead Channel Sensing Intrinsic Amplitude: 5.75 mV
Lead Channel Setting Sensing Sensitivity: 0.6 mV
MDC IDC LEAD LOCATION: 753860
MDC IDC MSMT BATTERY VOLTAGE: 2.64 V
MDC IDC MSMT LEADCHNL RV IMPEDANCE VALUE: 532 Ohm
MDC IDC SET LEADCHNL RV PACING AMPLITUDE: 2.5 V
MDC IDC SET LEADCHNL RV PACING PULSEWIDTH: 0.4 ms

## 2016-12-23 ENCOUNTER — Encounter: Payer: Self-pay | Admitting: Cardiology

## 2017-01-19 ENCOUNTER — Ambulatory Visit (INDEPENDENT_AMBULATORY_CARE_PROVIDER_SITE_OTHER): Payer: Medicare Other

## 2017-01-19 DIAGNOSIS — Z95810 Presence of automatic (implantable) cardiac defibrillator: Secondary | ICD-10-CM

## 2017-01-19 DIAGNOSIS — I255 Ischemic cardiomyopathy: Secondary | ICD-10-CM | POA: Diagnosis not present

## 2017-01-24 NOTE — Progress Notes (Signed)
EPIC Encounter for ICM Monitoring  Patient Name: [name] SpatesMickey H Turnbaugh is a 71 y.o. female Date: 01/24/2017 Primary Care Physican: Patient, No Pcp Per Primary Cardiologist:Koneswaran Electrophysiologist: Allred Dry Weight:unknown       Transmission received.   Thoracic impedance normal.  Prescribed dosage: Furosemide 20 mg 1 tablet every morning  Labs: 10/07/2015 Creatinine 0.87, BUN 12, Potassium 4.1, Sodium 141  Recommendations: None.  Follow-up plan: ICM clinic phone appointment on 02/20/2017.    Copy of ICM check sent to Dr. Johney FrameAllred.   3 month ICM trend: 01/19/2017    1 Year ICM trend:       Karie SodaLaurie S Short, RN 01/24/2017 8:27 AM

## 2017-02-09 ENCOUNTER — Other Ambulatory Visit: Payer: Self-pay | Admitting: Internal Medicine

## 2017-02-20 ENCOUNTER — Ambulatory Visit (INDEPENDENT_AMBULATORY_CARE_PROVIDER_SITE_OTHER): Payer: Medicare Other

## 2017-02-20 DIAGNOSIS — I255 Ischemic cardiomyopathy: Secondary | ICD-10-CM | POA: Diagnosis not present

## 2017-02-20 DIAGNOSIS — Z95810 Presence of automatic (implantable) cardiac defibrillator: Secondary | ICD-10-CM | POA: Diagnosis not present

## 2017-02-20 NOTE — Progress Notes (Signed)
EPIC Encounter for ICM Monitoring  Patient Name: [name] SpatesMickey H Haste is a 72 y.o. female Date: 02/20/2017 Primary Care Physican: Patient, No Pcp Per Primary Cardiologist:Koneswaran Electrophysiologist: Allred Dry Weight:unknown         Heart Failure questions reviewed, pt asymptomatic.   Thoracic impedance normal.  Prescribed dosage: Furosemide 20 mg 1 tablet every morning  Labs: 10/07/2015 Creatinine 0.87, BUN 12, Potassium 4.1, Sodium 141  Recommendations: No changes.   Encouraged to call for fluid symptoms.  Follow-up plan: ICM clinic phone appointment on 03/23/2017.   Copy of ICM check sent to Dr. Johney FrameAllred.   3 month ICM trend: 02/20/2017    1 Year ICM trend:       [name] SodaLaurie S Christphor Groft, RN 02/20/2017 1:50 PM

## 2017-02-22 ENCOUNTER — Other Ambulatory Visit: Payer: Self-pay

## 2017-02-22 ENCOUNTER — Ambulatory Visit (INDEPENDENT_AMBULATORY_CARE_PROVIDER_SITE_OTHER): Payer: Medicare Other

## 2017-02-22 DIAGNOSIS — I255 Ischemic cardiomyopathy: Secondary | ICD-10-CM

## 2017-03-01 ENCOUNTER — Encounter: Payer: Self-pay | Admitting: *Deleted

## 2017-03-01 ENCOUNTER — Telehealth: Payer: Self-pay | Admitting: *Deleted

## 2017-03-01 NOTE — Telephone Encounter (Signed)
Notes recorded by Lesle ChrisHill, Angela G, LPN on 1/61/09601/23/2019 at 8:56 AM EST Patient notified of results by letter. Copy to pmd. Follow up scheduled with Dr. Purvis SheffieldKoneswaran for 03/30/2017. ------  Notes recorded by Lesle ChrisHill, Angela G, LPN on 4/54/09811/18/2019 at 8:54 AM EST Mailbox full, can not accept messages. ------  Notes recorded by Hillis RangeAllred, James, MD on 02/22/2017 at 9:59 PM EST Results reviewed. Isabelle CourseLydia, please inform pt of result. I will route to Dr Purvis SheffieldKoneswaran as Lorain ChildesFYI also.

## 2017-03-01 NOTE — Telephone Encounter (Signed)
Correction, no pcp listed.

## 2017-03-23 ENCOUNTER — Ambulatory Visit (INDEPENDENT_AMBULATORY_CARE_PROVIDER_SITE_OTHER): Payer: Medicare Other | Admitting: *Deleted

## 2017-03-23 DIAGNOSIS — I255 Ischemic cardiomyopathy: Secondary | ICD-10-CM

## 2017-03-23 DIAGNOSIS — Z95810 Presence of automatic (implantable) cardiac defibrillator: Secondary | ICD-10-CM

## 2017-03-23 NOTE — Progress Notes (Signed)
Remote ICD transmission.   

## 2017-03-24 NOTE — Progress Notes (Signed)
EPIC Encounter for ICM Monitoring  Patient Name: [name] is a 72 y.o. female Date: 03/24/2017 Primary Care Physican: Patient, No Pcp Per Primary Cardiologist:Koneswaran Electrophysiologist: Allred Dry Weight:does not weigh     Heart Failure questions reviewed, pt asymptomatic.   Thoracic impedance is almost at baseline normal but was abnormal suggesting fluid accumulation from 03/03/2017 through 03/12/2017.  Prescribed dosage: Furosemide 20 mg 1 tablet every morning  Labs: 10/07/2015 Creatinine 0.87, BUN 12, Potassium 4.1, Sodium 141  Recommendations: She has been eating foods that have too much salt.   Encouraged to call for fluid symptoms.  Follow-up plan: ICM clinic phone appointment on 03/29/2017 to recheck fluid levels before office visit 03/30/2017 with Dr. Purvis SheffieldKoneswaran.  Copy of ICM check sent to Dr. Johney FrameAllred and Dr Purvis SheffieldKoneswaran for review and will call back if any recommendations.   3 month ICM trend: 03/24/2017    1 Year ICM trend:       [name] SodaLaurie S Annissa Andreoni, RN 03/24/2017 12:49 PM

## 2017-03-25 ENCOUNTER — Other Ambulatory Visit: Payer: Self-pay | Admitting: Internal Medicine

## 2017-03-28 ENCOUNTER — Ambulatory Visit (INDEPENDENT_AMBULATORY_CARE_PROVIDER_SITE_OTHER): Payer: Self-pay

## 2017-03-28 DIAGNOSIS — Z95810 Presence of automatic (implantable) cardiac defibrillator: Secondary | ICD-10-CM

## 2017-03-28 DIAGNOSIS — I255 Ischemic cardiomyopathy: Secondary | ICD-10-CM

## 2017-03-28 NOTE — Progress Notes (Signed)
EPIC Encounter for ICM Monitoring  Patient Name: [name] SpatesMickey H Rye is a 72 y.o. female Date: 03/28/2017 Primary Care Physican: Patient, No Pcp Per Primary Cardiologist:Koneswaran Electrophysiologist: Allred Dry Weight:unknown                                                          Heart Failure questions reviewed, pt asymptomatic.   Thoracic impedance normal.  Prescribed dosage: Furosemide 20 mg 1 tablet every morning  Labs: 10/07/2015 Creatinine 0.87, BUN 12, Potassium 4.1, Sodium 141  Recommendations: No changes.   Encouraged to call for fluid symptoms.  Follow-up plan: ICM clinic phone appointment on 04/28/2017.   Copy of ICM check sent to Dr. Johney FrameAllred.   3 month ICM trend: 03/27/2017    1 Year ICM trend:       [name] SodaLaurie S Mikell Camp, RN 03/28/2017 5:42 PM

## 2017-03-29 ENCOUNTER — Encounter: Payer: Self-pay | Admitting: Cardiology

## 2017-03-30 ENCOUNTER — Ambulatory Visit (INDEPENDENT_AMBULATORY_CARE_PROVIDER_SITE_OTHER): Payer: Medicare Other | Admitting: Cardiovascular Disease

## 2017-03-30 ENCOUNTER — Encounter: Payer: Self-pay | Admitting: Cardiovascular Disease

## 2017-03-30 VITALS — BP 144/88 | HR 116 | Ht 61.0 in | Wt 173.0 lb

## 2017-03-30 DIAGNOSIS — I255 Ischemic cardiomyopathy: Secondary | ICD-10-CM | POA: Diagnosis not present

## 2017-03-30 DIAGNOSIS — I10 Essential (primary) hypertension: Secondary | ICD-10-CM

## 2017-03-30 DIAGNOSIS — I739 Peripheral vascular disease, unspecified: Secondary | ICD-10-CM

## 2017-03-30 DIAGNOSIS — I25118 Atherosclerotic heart disease of native coronary artery with other forms of angina pectoris: Secondary | ICD-10-CM

## 2017-03-30 DIAGNOSIS — Z95810 Presence of automatic (implantable) cardiac defibrillator: Secondary | ICD-10-CM

## 2017-03-30 DIAGNOSIS — E7849 Other hyperlipidemia: Secondary | ICD-10-CM

## 2017-03-30 DIAGNOSIS — I472 Ventricular tachycardia: Secondary | ICD-10-CM | POA: Diagnosis not present

## 2017-03-30 DIAGNOSIS — I4729 Other ventricular tachycardia: Secondary | ICD-10-CM

## 2017-03-30 MED ORDER — RIVAROXABAN 2.5 MG PO TABS: 2.5000 mg | ORAL_TABLET | Freq: Two times a day (BID) | ORAL | 6 refills | Status: DC

## 2017-03-30 NOTE — Addendum Note (Signed)
Addended by: Lesle ChrisHILL, Blakleigh Straw G on: 03/30/2017 01:25 PM   Modules accepted: Orders
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
## 2017-03-30 NOTE — Patient Instructions (Signed)
Medication Instructions:   Stop Plavix.  Begin Xarelto 2.5mg twice a day.  Continue all other medications.    Labwork: none  Testing/Procedures: none  Follow-Up: 3 months   Any Other Special Instructions Will Be Listed Below (If Applicable).  If you need a refill on your cardiac medications before your next appointment, please call your pharmacy.  

## 2017-04-05 ENCOUNTER — Encounter: Payer: Self-pay | Admitting: *Deleted

## 2017-04-05 ENCOUNTER — Other Ambulatory Visit: Payer: Self-pay | Admitting: *Deleted

## 2017-04-05 DIAGNOSIS — E785 Hyperlipidemia, unspecified: Secondary | ICD-10-CM

## 2017-04-07 LAB — CUP PACEART REMOTE DEVICE CHECK
Brady Statistic RV Percent Paced: 0.01 %
HighPow Impedance: 53 Ohm
HighPow Impedance: 75 Ohm
Implantable Lead Implant Date: 20041102
Implantable Lead Model: 6949
Lead Channel Sensing Intrinsic Amplitude: 9.125 mV
Lead Channel Setting Pacing Amplitude: 2.5 V
Lead Channel Setting Pacing Pulse Width: 0.4 ms
Lead Channel Setting Sensing Sensitivity: 0.6 mV
MDC IDC LEAD LOCATION: 753860
MDC IDC MSMT BATTERY VOLTAGE: 2.63 V
MDC IDC MSMT LEADCHNL RV IMPEDANCE VALUE: 589 Ohm
MDC IDC MSMT LEADCHNL RV SENSING INTR AMPL: 9.125 mV
MDC IDC PG IMPLANT DT: 20101021
MDC IDC SESS DTM: 20190214094235

## 2017-04-28 ENCOUNTER — Ambulatory Visit (INDEPENDENT_AMBULATORY_CARE_PROVIDER_SITE_OTHER): Payer: Medicare Other

## 2017-04-28 ENCOUNTER — Telehealth: Payer: Self-pay

## 2017-04-28 DIAGNOSIS — I255 Ischemic cardiomyopathy: Secondary | ICD-10-CM | POA: Diagnosis not present

## 2017-04-28 DIAGNOSIS — Z95810 Presence of automatic (implantable) cardiac defibrillator: Secondary | ICD-10-CM

## 2017-04-28 NOTE — Telephone Encounter (Signed)
Remote ICM transmission received.  Attempted call to patient and mail box is full. 

## 2017-04-28 NOTE — Progress Notes (Signed)
EPIC Encounter for ICM Monitoring  Patient Name: [name] SpatesMickey H Fantini is a 72 y.o. female Date: 04/28/2017 Primary Care Physican: Patient, No Pcp Per Primary Cardiologist:Koneswaran Electrophysiologist: Allred Dry Weight:unknown       Attempted call to patient and unable to reach.  Transmission reviewed.    Thoracic impedance abnormal suggesting fluid accumulation.  Prescribed dosage: Furosemide 20 mg 1 tablet every morning  Labs: 10/07/2015 Creatinine 0.87, BUN 12, Potassium 4.1, Sodium 141   Recommendations: NONE - Unable to reach.  Follow-up plan: ICM clinic phone appointment on 05/04/2017 to recheck fluid levels.    Copy of ICM check sent to Dr. Johney FrameAllred and Dr. Purvis SheffieldKoneswaran.   3 month ICM trend: 04/28/2017    1 Year ICM trend:       Karie SodaLaurie S Poppi Scantling, RN 04/28/2017 9:31 AM

## 2017-05-04 ENCOUNTER — Ambulatory Visit (INDEPENDENT_AMBULATORY_CARE_PROVIDER_SITE_OTHER): Payer: Self-pay

## 2017-05-04 DIAGNOSIS — I255 Ischemic cardiomyopathy: Secondary | ICD-10-CM

## 2017-05-04 DIAGNOSIS — Z95810 Presence of automatic (implantable) cardiac defibrillator: Secondary | ICD-10-CM

## 2017-05-04 NOTE — Progress Notes (Signed)
EPIC Encounter for ICM Monitoring  Patient Name: [name] is a 72 y.o. female Date: 05/04/2017 Primary Care Physican: Patient, No Pcp Per Primary Cardiologist:Koneswaran Electrophysiologist: Allred Dry Weight:unknown      Attempted call to patient and unable to reach. Transmission reviewed.    Thoracic impedance close to baseline normal.  Prescribed dosage: Furosemide 20 mg 1 tablet every morning  Labs: 10/07/2015 Creatinine 0.87, BUN 12, Potassium 4.1, Sodium 141  Recommendations: NONE - Unable to reach.  Follow-up plan: ICM clinic phone appointment on 05/29/2017.   Copy of ICM check sent to Dr. Johney FrameAllred.   3 month ICM trend: 05/04/2017    1 Year ICM trend:       Karie SodaLaurie S Kingsley Herandez, RN 05/04/2017 10:03 AM

## 2017-05-05 ENCOUNTER — Telehealth: Payer: Self-pay

## 2017-05-05 NOTE — Telephone Encounter (Signed)
Remote ICM transmission received.  Attempted call to patient and mail box is full. 

## 2017-05-05 NOTE — Progress Notes (Signed)
Patient returned call.  She said she has been limiting salt and doing well.  She denied any fluid symptoms.  No changes today and next ICM remote transmission 05/29/2017.

## 2017-05-08 ENCOUNTER — Other Ambulatory Visit: Payer: Self-pay | Admitting: Internal Medicine

## 2017-05-25 ENCOUNTER — Other Ambulatory Visit: Payer: Self-pay | Admitting: Cardiovascular Disease

## 2017-05-25 ENCOUNTER — Other Ambulatory Visit: Payer: Self-pay | Admitting: Internal Medicine

## 2017-05-25 DIAGNOSIS — I255 Ischemic cardiomyopathy: Secondary | ICD-10-CM

## 2017-05-29 ENCOUNTER — Telehealth: Payer: Self-pay | Admitting: Cardiology

## 2017-05-29 NOTE — Telephone Encounter (Signed)
Attempted to confirm remote transmission with pt. No answer and was unable to leave a message.   

## 2017-06-09 NOTE — Progress Notes (Signed)
No ICM remote transmission received for 05/29/2017 and next ICM transmission scheduled for 06/22/2017.    

## 2017-06-22 ENCOUNTER — Telehealth: Payer: Self-pay

## 2017-06-22 ENCOUNTER — Ambulatory Visit (INDEPENDENT_AMBULATORY_CARE_PROVIDER_SITE_OTHER): Payer: Medicare Other | Admitting: *Deleted

## 2017-06-22 DIAGNOSIS — I4729 Other ventricular tachycardia: Secondary | ICD-10-CM

## 2017-06-22 DIAGNOSIS — I255 Ischemic cardiomyopathy: Secondary | ICD-10-CM | POA: Diagnosis not present

## 2017-06-22 DIAGNOSIS — Z95810 Presence of automatic (implantable) cardiac defibrillator: Secondary | ICD-10-CM

## 2017-06-22 DIAGNOSIS — I472 Ventricular tachycardia: Secondary | ICD-10-CM | POA: Diagnosis not present

## 2017-06-22 LAB — CUP PACEART REMOTE DEVICE CHECK
Battery Voltage: 2.61 V
Date Time Interrogation Session: 20190516041707
HighPow Impedance: 55 Ohm
HighPow Impedance: 77 Ohm
Implantable Lead Implant Date: 20041102
Implantable Lead Location: 753860
Implantable Lead Model: 6949
Implantable Pulse Generator Implant Date: 20101021
Lead Channel Setting Pacing Amplitude: 2.5 V
MDC IDC MSMT LEADCHNL RV IMPEDANCE VALUE: 551 Ohm
MDC IDC MSMT LEADCHNL RV SENSING INTR AMPL: 5.25 mV
MDC IDC MSMT LEADCHNL RV SENSING INTR AMPL: 5.25 mV
MDC IDC SET LEADCHNL RV PACING PULSEWIDTH: 0.4 ms
MDC IDC SET LEADCHNL RV SENSING SENSITIVITY: 0.6 mV
MDC IDC STAT BRADY RV PERCENT PACED: 0 %

## 2017-06-22 NOTE — Progress Notes (Signed)
EPIC Encounter for ICM Monitoring  Patient Name: [name] is a 72 y.o. female Date: 06/22/2017 Primary Care Physican: Patient, No Pcp Per Primary Cardiologist:Koneswaran Electrophysiologist: Allred Dry Weight:unknown      Attempted call to patient and unable to reach.  Transmission reviewed.    Thoracic impedance abnormal suggesting fluid accumulation since 06/15/2017.  Prescribed dosage: Furosemide 20 mg 1 tablet every morning  Labs: 10/07/2015 Creatinine 0.87, BUN 12, Potassium 4.1, Sodium 141  Recommendations: NONE - Unable to reach.  Follow-up plan: ICM clinic phone appointment on 07/07/2017 to recheck fluid levels before office appointment scheduled 07/10/2017 with Dr. Purvis Sheffield.  Copy of ICM check sent to Dr. Purvis Sheffield and Dr. Johney Frame.   3 month ICM trend: 06/22/2017    1 Year ICM trend:       [name], RN 06/22/2017 3:48 PM

## 2017-06-22 NOTE — Telephone Encounter (Signed)
Remote ICM transmission received.  Attempted call to patient and mail box is full. 

## 2017-06-22 NOTE — Progress Notes (Signed)
Remote ICD transmission.   

## 2017-06-23 ENCOUNTER — Encounter: Payer: Self-pay | Admitting: Cardiology

## 2017-07-07 ENCOUNTER — Telehealth: Payer: Self-pay

## 2017-07-07 ENCOUNTER — Ambulatory Visit (INDEPENDENT_AMBULATORY_CARE_PROVIDER_SITE_OTHER): Payer: Self-pay

## 2017-07-07 DIAGNOSIS — Z95810 Presence of automatic (implantable) cardiac defibrillator: Secondary | ICD-10-CM

## 2017-07-07 DIAGNOSIS — I255 Ischemic cardiomyopathy: Secondary | ICD-10-CM

## 2017-07-07 NOTE — Progress Notes (Signed)
EPIC Encounter for ICM Monitoring  Patient Name: [name] is a 72 y.o. female Date: 07/07/2017 Primary Care Physican: Patient, No Pcp Per Primary Cardiologist:Koneswaran Electrophysiologist: Allred Dry Weight:unknown       Attempted call to patient and unable to reach.   Transmission reviewed.    Thoracic impedance returned to normal since last ICM remote on 06/22/2017.  Prescribed dosage: Furosemide 20 mg 1 tablet every morning  Labs: 10/07/2015 Creatinine 0.87, BUN 12, Potassium 4.1, Sodium 141  Recommendations: NONE - Unable to reach.  Follow-up plan: ICM clinic phone appointment on 08/07/2017.  Office appointment scheduled 07/10/2017 with Dr. Purvis SheffieldKoneswaran.  Copy of ICM check sent to Dr. Purvis SheffieldKoneswaran and Dr. Johney FrameAllred.   3 month ICM trend: 07/07/2017    1 Year ICM trend:       [name] SodaLaurie S Mariene Dickerman, RN 07/07/2017 7:28 AM

## 2017-07-07 NOTE — Telephone Encounter (Signed)
Remote ICM transmission received.  Attempted call to patient and no answer, mail box is full.

## 2017-07-08 ENCOUNTER — Telehealth: Payer: Self-pay | Admitting: Physician Assistant

## 2017-07-08 NOTE — Telephone Encounter (Signed)
[name] called because she did a remote transmission yesterday and thought she might have fluid around her heart.  Of note, her speech was very difficult to understand and I had to keep asking her to repeat what she said.  However, when she slurred her speech a bit and spoke more clearly, I was able to understand.  [name] stated that she had gained a few pounds.  She could not tell me how many.  She feels that she may be a bit more short of breath than usual.  She is not having lower extremity edema when she wakes up, orthopnea or PND are not clear.  She states there may have been salt in some of the foods she ate recently but she is really trying to watch the salt she puts in foods.  She is not in acute distress.  I advised that her Optivol readings were a little elevated.  I requested that she take an extra Lasix tablet tomorrow for total 40 mg.  She has an appointment with Dr. Purvis SheffieldKoneswaran on 07/10/2017 and I emphasized that she should keep it.  Call back tomorrow if she is no better.  Theodore Demarkhonda Barrett, PA-C 07/08/2017 6:09 PM Beeper [phone]

## 2017-07-10 ENCOUNTER — Telehealth: Payer: Self-pay | Admitting: Cardiovascular Disease

## 2017-07-10 ENCOUNTER — Encounter: Payer: Self-pay | Admitting: Cardiovascular Disease

## 2017-07-10 ENCOUNTER — Ambulatory Visit (INDEPENDENT_AMBULATORY_CARE_PROVIDER_SITE_OTHER): Payer: Medicare Other | Admitting: Cardiovascular Disease

## 2017-07-10 ENCOUNTER — Encounter: Payer: Self-pay | Admitting: *Deleted

## 2017-07-10 VITALS — BP 102/50 | HR 92 | Ht 61.0 in | Wt 176.0 lb

## 2017-07-10 DIAGNOSIS — I5033 Acute on chronic diastolic (congestive) heart failure: Secondary | ICD-10-CM | POA: Diagnosis not present

## 2017-07-10 DIAGNOSIS — I255 Ischemic cardiomyopathy: Secondary | ICD-10-CM | POA: Diagnosis not present

## 2017-07-10 DIAGNOSIS — I10 Essential (primary) hypertension: Secondary | ICD-10-CM

## 2017-07-10 DIAGNOSIS — Z95810 Presence of automatic (implantable) cardiac defibrillator: Secondary | ICD-10-CM

## 2017-07-10 DIAGNOSIS — E785 Hyperlipidemia, unspecified: Secondary | ICD-10-CM

## 2017-07-10 DIAGNOSIS — R0602 Shortness of breath: Secondary | ICD-10-CM | POA: Diagnosis not present

## 2017-07-10 DIAGNOSIS — R079 Chest pain, unspecified: Secondary | ICD-10-CM | POA: Diagnosis not present

## 2017-07-10 DIAGNOSIS — I739 Peripheral vascular disease, unspecified: Secondary | ICD-10-CM

## 2017-07-10 MED ORDER — FUROSEMIDE 40 MG PO TABS: 40.0000 mg | ORAL_TABLET | Freq: Every day | ORAL | 0 refills | Status: DC

## 2017-07-10 MED ORDER — POTASSIUM CHLORIDE CRYS ER 20 MEQ PO TBCR: 20.0000 meq | EXTENDED_RELEASE_TABLET | Freq: Every day | ORAL | 0 refills | Status: DC

## 2017-07-10 MED ORDER — POTASSIUM CHLORIDE ER 10 MEQ PO TBCR: 10.0000 meq | EXTENDED_RELEASE_TABLET | Freq: Every day | ORAL | 6 refills | Status: DC

## 2017-07-10 NOTE — Telephone Encounter (Signed)
July 31, 2017  Lexiscan - chest pain Jeani Hawkingnnie penn
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
## 2017-07-10 NOTE — Patient Instructions (Addendum)
Medication Instructions:   Increase Lasix to 40mg  daily x 5 days, then resume previous dose of 20mg  daily.   Begin Potassium at 20meq daily x 5 days, then decrease to 10meq daily thereafter.  Continue all other medications.    Labwork: none  Testing/Procedures:  Your physician has requested that you have a lexiscan myoview. For further information please visit [email or web address]. Please follow instruction sheet, as given.  Office will contact with results via phone or letter.    Follow-Up: 6 weeks   Any Other Special Instructions Will Be Listed Below (If Applicable). Weigh daily x 2 weeks & call office with readings.   If you need a refill on your cardiac medications before your next appointment, please call your pharmacy.

## 2017-07-31 ENCOUNTER — Encounter (HOSPITAL_BASED_OUTPATIENT_CLINIC_OR_DEPARTMENT_OTHER)
Admission: RE | Admit: 2017-07-31 | Discharge: 2017-07-31 | Disposition: A | Discharge status: Home / Self Care | Payer: Medicare Other | Source: Ambulatory Visit | Attending: Cardiovascular Disease | Admitting: Cardiovascular Disease

## 2017-07-31 ENCOUNTER — Ambulatory Visit (HOSPITAL_COMMUNITY)
Admission: RE | Admit: 2017-07-31 | Discharge: 2017-07-31 | Disposition: A | Discharge status: Home / Self Care | Payer: Medicare Other | Source: Ambulatory Visit | Attending: Cardiovascular Disease | Admitting: Cardiovascular Disease

## 2017-07-31 DIAGNOSIS — R079 Chest pain, unspecified: Secondary | ICD-10-CM

## 2017-07-31 LAB — NM MYOCAR MULTI W/SPECT W/WALL MOTION / EF
CHL CUP RESTING HR STRESS: 98 {beats}/min
LVDIAVOL: 77 mL (ref 46–106)
LVSYSVOL: 42 mL
NUC STRESS TID: 1.01
Peak HR: 108 {beats}/min
RATE: 0.37
SDS: 0
SRS: 0
SSS: 0

## 2017-07-31 MED ORDER — TECHNETIUM TC 99M TETROFOSMIN IV KIT: 30.0000 | PACK | Freq: Once | INTRAVENOUS | Status: DC | PRN

## 2017-07-31 MED ORDER — TECHNETIUM TC 99M TETROFOSMIN IV KIT
10.0000 | PACK | Freq: Once | INTRAVENOUS | Status: AC | PRN
  Administered 2017-07-31: 9.89 via INTRAVENOUS

## 2017-07-31 MED ORDER — SODIUM CHLORIDE 0.9% FLUSH
INTRAVENOUS | Status: AC
  Administered 2017-07-31: 10 mL via INTRAVENOUS
  Filled 2017-07-31: qty 10

## 2017-07-31 MED ORDER — REGADENOSON 0.4 MG/5ML IV SOLN
INTRAVENOUS | Status: AC
  Administered 2017-07-31: 0.4 mg via INTRAVENOUS
  Filled 2017-07-31: qty 5

## 2017-08-07 ENCOUNTER — Telehealth: Payer: Self-pay | Admitting: *Deleted

## 2017-08-07 ENCOUNTER — Ambulatory Visit (INDEPENDENT_AMBULATORY_CARE_PROVIDER_SITE_OTHER): Payer: Medicare Other

## 2017-08-07 DIAGNOSIS — I255 Ischemic cardiomyopathy: Secondary | ICD-10-CM

## 2017-08-07 DIAGNOSIS — Z95810 Presence of automatic (implantable) cardiac defibrillator: Secondary | ICD-10-CM

## 2017-08-07 DIAGNOSIS — R079 Chest pain, unspecified: Secondary | ICD-10-CM

## 2017-08-07 NOTE — Progress Notes (Signed)
EPIC Encounter for ICM Monitoring  Patient Name: [name] SpatesMickey H Stopper is a 72 y.o. female Date: 08/07/2017 Primary Care Physican: Patient, No Pcp Per Primary Cardiologist:Koneswaran Electrophysiologist: Allred Dry Weight:178 lbs      Heart Failure questions reviewed, pt stated she has swelling in her feet and she has a cold.  Discussed with her that the device battery reached ERI on 07/31/2017 but patient has not heard the device alarm.  Advised Dr Johney FrameAllred will discuss battery replacement with her at OV 09/08/2017.      Thoracic impedance starting to decrease below baseline suggesting fluid accumulation starting 08/02/2017.   Impedance was also below baseline 07/25/2017 to 07/29/2017.    Prescribed dosage: Furosemide 20 mg 1 tablet every morning.  Potassium 10 mEq 1 tablet daily.   Labs: 10/07/2015 Creatinine 0.87, BUN 12, Potassium 4.1, Sodium 141  Recommendations: Advised to increase Furosemide to 40 mg x 2 days and Potassium to 20 mEq x 2 days and then return to prescribed dosages of 1 tablet of Furosemide daily and 1 tablet of Potassium daily.  Follow-up plan: ICM clinic phone appointment on 08/11/2017 (manual send) to recheck fluid levels.  Office appointment scheduled 09/08/2017 with Dr. Johney FrameAllred.   Copy of ICM check sent to Dr. Johney FrameAllred and Dr. Purvis SheffieldKoneswaran.   3 month ICM trend: 08/07/2017    1 Year ICM trend:       [name] SodaLaurie S Matea Stanard, RN 08/07/2017 11:50 AM

## 2017-08-07 NOTE — Telephone Encounter (Signed)
Notes recorded by Lesle ChrisHill, Angela G, LPN on 1/6/10967/02/2017 at 11:40 AM EDT Patient notified. Had f/u scheduled for 08/14/2017 with Dr. Purvis [place]oneswaran in [place]. Will cancel this as she has appt with pain doctor this day in the evening. Patient agrees to do limited echo. [facility] put [facility] in Epic & [facility] ------  Notes recorded by Lesle ChrisHill, Angela G, LPN on 0/45/40986/26/2019 at 11:16 AM EDT Mail box full. ------  Notes recorded by Laqueta LindenKoneswaran, Suresh A, MD on 08/01/2017 at 12:42 PM EDT Low risk for blockages. Left ventricular function appears mildly reduced. Please obtain a limited echocardiogram purely to assess LVEF and regional wall motion.

## 2017-08-11 ENCOUNTER — Telehealth: Payer: Self-pay | Admitting: Cardiology

## 2017-08-11 ENCOUNTER — Telehealth: Payer: Self-pay

## 2017-08-11 NOTE — Telephone Encounter (Signed)
Returned patient call as requested by voice mail message. She said she hears a sound from the device. Advised we discussed on 08/07/2017 that her device has reached battery replacement time and that is the sound she is hearing.  Advised she will hear daily until it is turned off at the office visit with Dr Johney FrameAllred 09/08/2017 or she can make an appt with device clinic in [place] to have it turned off.  She said she will wait until the office visit.  She forgot to send remote transmission today for fluid recheck and will send next week.

## 2017-08-11 NOTE — Telephone Encounter (Signed)
Attempted to confirm remote transmission with pt. No answer and was unable to leave a message.   

## 2017-08-14 ENCOUNTER — Ambulatory Visit: Payer: Medicare Other | Admitting: Cardiovascular Disease

## 2017-08-14 ENCOUNTER — Ambulatory Visit (INDEPENDENT_AMBULATORY_CARE_PROVIDER_SITE_OTHER): Payer: Self-pay

## 2017-08-14 DIAGNOSIS — I255 Ischemic cardiomyopathy: Secondary | ICD-10-CM

## 2017-08-14 DIAGNOSIS — Z95810 Presence of automatic (implantable) cardiac defibrillator: Secondary | ICD-10-CM

## 2017-08-15 ENCOUNTER — Telehealth: Payer: Self-pay | Admitting: Cardiovascular Disease

## 2017-08-15 NOTE — Progress Notes (Signed)
EPIC Encounter for ICM Monitoring  Patient Name: [name] is a 72 y.o. female Date: 08/15/2017 Primary Care Physican: Patient, No Pcp Per Primary Cardiologist:Koneswaran Electrophysiologist: Allred Dry Weight:175 lbs ERI reached 07/31/2017      Heart Failure questions reviewed, pt asymptomatic.  Echo scheduled 09/07/2017.  Advised fluid levels will be checked about 6 weeks after she has battery replacement.    Thoracic impedance returned to normal since taking 2 days of extra Furosemide.  Prescribed dosage: Furosemide 20 mg 1 tablet every morning.  Potassium 10 mEq 1 tablet daily.   Labs: 10/07/2015 Creatinine 0.87, BUN 12, Potassium 4.1, Sodium 141  Recommendations: No changes.   Encouraged to call for fluid symptoms.  Follow-up plan: ICM clinic phone appointment on 10/10/2017.   Office appointment scheduled 09/08/2017 with Dr. Johney FrameAllred to discuss device replacement.   Copy of ICM check sent to Dr. Johney FrameAllred.   3 month ICM trend: 08/14/2017    1 Year ICM trend:       Karie SodaLaurie S Jabaree Mercado, RN 08/15/2017 8:31 AM

## 2017-08-15 NOTE — Telephone Encounter (Signed)
[facility] September 07, 2017

## 2017-09-07 ENCOUNTER — Ambulatory Visit (INDEPENDENT_AMBULATORY_CARE_PROVIDER_SITE_OTHER): Payer: Medicare Other

## 2017-09-07 ENCOUNTER — Other Ambulatory Visit: Payer: Self-pay

## 2017-09-07 DIAGNOSIS — R079 Chest pain, unspecified: Secondary | ICD-10-CM

## 2017-09-08 ENCOUNTER — Encounter: Payer: Self-pay | Admitting: *Deleted

## 2017-09-08 ENCOUNTER — Encounter: Payer: Self-pay | Admitting: Internal Medicine

## 2017-09-08 ENCOUNTER — Telehealth: Payer: Self-pay | Admitting: Internal Medicine

## 2017-09-08 ENCOUNTER — Telehealth: Payer: Self-pay | Admitting: *Deleted

## 2017-09-08 ENCOUNTER — Ambulatory Visit (INDEPENDENT_AMBULATORY_CARE_PROVIDER_SITE_OTHER): Payer: Medicare Other | Admitting: Internal Medicine

## 2017-09-08 VITALS — BP 160/90 | HR 107 | Ht 61.0 in | Wt 172.0 lb

## 2017-09-08 DIAGNOSIS — I4729 Other ventricular tachycardia: Secondary | ICD-10-CM

## 2017-09-08 DIAGNOSIS — I10 Essential (primary) hypertension: Secondary | ICD-10-CM | POA: Diagnosis not present

## 2017-09-08 DIAGNOSIS — Z01812 Encounter for preprocedural laboratory examination: Secondary | ICD-10-CM

## 2017-09-08 DIAGNOSIS — I255 Ischemic cardiomyopathy: Secondary | ICD-10-CM | POA: Diagnosis not present

## 2017-09-08 DIAGNOSIS — Z95810 Presence of automatic (implantable) cardiac defibrillator: Secondary | ICD-10-CM | POA: Diagnosis not present

## 2017-09-08 DIAGNOSIS — I472 Ventricular tachycardia: Secondary | ICD-10-CM | POA: Diagnosis not present

## 2017-09-08 LAB — CUP PACEART INCLINIC DEVICE CHECK
Brady Statistic RV Percent Paced: 0.01 %
Date Time Interrogation Session: 20190802155046
HIGH POWER IMPEDANCE MEASURED VALUE: 54 Ohm
HighPow Impedance: 72 Ohm
Lead Channel Pacing Threshold Amplitude: 0.75 V
Lead Channel Sensing Intrinsic Amplitude: 6.625 mV
Lead Channel Setting Pacing Amplitude: 2.5 V
Lead Channel Setting Pacing Pulse Width: 0.4 ms
MDC IDC LEAD IMPLANT DT: 20041102
MDC IDC LEAD LOCATION: 753860
MDC IDC MSMT BATTERY VOLTAGE: 2.61 V
MDC IDC MSMT LEADCHNL RV IMPEDANCE VALUE: 551 Ohm
MDC IDC MSMT LEADCHNL RV PACING THRESHOLD PULSEWIDTH: 0.4 ms
MDC IDC PG IMPLANT DT: 20101021
MDC IDC SET LEADCHNL RV SENSING SENSITIVITY: 0.6 mV

## 2017-09-08 NOTE — Telephone Encounter (Signed)
Pre-cert Verification for the following procedure   Generator change out - 09/13/2017 -  - Allred
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
## 2017-09-08 NOTE — Telephone Encounter (Signed)
Notes recorded by Lesle ChrisHill, Angela G, LPN on 1/6/10968/03/2017 at 3:04 PM EDT Patient notified. Copy to pmd. ------  Notes recorded by Laqueta LindenKoneswaran, Suresh A, MD on 09/07/2017 at 2:11 PM EDT Pumping function was normal. There is mild calcium buildup with mild narrowing of the aortic valve

## 2017-09-08 NOTE — Patient Instructions (Signed)
Medication Instructions:  Continue all current medications.  Labwork:  BMET, CBC - orders given today.   Office will contact with results via phone or letter.    Testing/Procedures: Device generator change out.    Follow-Up: Pending after procedure.    Any Other Special Instructions Will Be Listed Below (If Applicable).  If you need a refill on your cardiac medications before your next appointment, please call your pharmacy.

## 2017-09-08 NOTE — Telephone Encounter (Signed)
Correction - no pmd listed.
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
## 2017-09-11 LAB — CBC
HEMATOCRIT: 41.1 % (ref 35.0–45.0)
Hemoglobin: 13.8 g/dL (ref 11.7–15.5)
MCH: 28.5 pg (ref 27.0–33.0)
MCHC: 33.6 g/dL (ref 32.0–36.0)
MCV: 84.9 fL (ref 80.0–100.0)
MPV: 12 fL (ref 7.5–12.5)
Platelets: 199 10*3/uL (ref 140–400)
RBC: 4.84 10*6/uL (ref 3.80–5.10)
RDW: 12.1 % (ref 11.0–15.0)
WBC: 8.2 10*3/uL (ref 3.8–10.8)

## 2017-09-11 LAB — BASIC METABOLIC PANEL
BUN / CREAT RATIO: 15 (calc) (ref 6–22)
BUN: 15 mg/dL (ref 7–25)
CHLORIDE: 105 mmol/L (ref 98–110)
CO2: 29 mmol/L (ref 20–32)
CREATININE: 1.02 mg/dL — AB (ref 0.60–0.93)
Calcium: 9.1 mg/dL (ref 8.6–10.4)
Glucose, Bld: 146 mg/dL — ABNORMAL HIGH (ref 65–139)
POTASSIUM: 4.3 mmol/L (ref 3.5–5.3)
Sodium: 141 mmol/L (ref 135–146)

## 2017-09-13 ENCOUNTER — Ambulatory Visit (HOSPITAL_COMMUNITY)
Admission: RE | Admit: 2017-09-13 | Discharge: 2017-09-13 | Disposition: A | Discharge status: Home / Self Care | Payer: Medicare Other | Source: Ambulatory Visit | Attending: Internal Medicine | Admitting: Internal Medicine

## 2017-09-13 ENCOUNTER — Other Ambulatory Visit: Payer: Self-pay

## 2017-09-13 ENCOUNTER — Encounter (HOSPITAL_COMMUNITY)
Admission: RE | Disposition: A | Discharge status: Home / Self Care | Payer: Self-pay | Source: Ambulatory Visit | Attending: Internal Medicine

## 2017-09-13 ENCOUNTER — Ambulatory Visit (HOSPITAL_COMMUNITY): Payer: Medicare Other

## 2017-09-13 DIAGNOSIS — G894 Chronic pain syndrome: Secondary | ICD-10-CM | POA: Insufficient documentation

## 2017-09-13 DIAGNOSIS — Z9889 Other specified postprocedural states: Secondary | ICD-10-CM | POA: Insufficient documentation

## 2017-09-13 DIAGNOSIS — J449 Chronic obstructive pulmonary disease, unspecified: Secondary | ICD-10-CM | POA: Diagnosis not present

## 2017-09-13 DIAGNOSIS — I509 Heart failure, unspecified: Secondary | ICD-10-CM | POA: Insufficient documentation

## 2017-09-13 DIAGNOSIS — I472 Ventricular tachycardia: Secondary | ICD-10-CM | POA: Insufficient documentation

## 2017-09-13 DIAGNOSIS — Z95810 Presence of automatic (implantable) cardiac defibrillator: Secondary | ICD-10-CM

## 2017-09-13 DIAGNOSIS — I2510 Atherosclerotic heart disease of native coronary artery without angina pectoris: Secondary | ICD-10-CM | POA: Insufficient documentation

## 2017-09-13 DIAGNOSIS — T82110A Breakdown (mechanical) of cardiac electrode, initial encounter: Secondary | ICD-10-CM | POA: Diagnosis not present

## 2017-09-13 DIAGNOSIS — Z79899 Other long term (current) drug therapy: Secondary | ICD-10-CM | POA: Diagnosis not present

## 2017-09-13 DIAGNOSIS — Z8619 Personal history of other infectious and parasitic diseases: Secondary | ICD-10-CM | POA: Diagnosis not present

## 2017-09-13 DIAGNOSIS — I255 Ischemic cardiomyopathy: Secondary | ICD-10-CM | POA: Insufficient documentation

## 2017-09-13 DIAGNOSIS — Z87828 Personal history of other (healed) physical injury and trauma: Secondary | ICD-10-CM | POA: Diagnosis not present

## 2017-09-13 DIAGNOSIS — Z7901 Long term (current) use of anticoagulants: Secondary | ICD-10-CM | POA: Diagnosis not present

## 2017-09-13 DIAGNOSIS — F17200 Nicotine dependence, unspecified, uncomplicated: Secondary | ICD-10-CM | POA: Diagnosis not present

## 2017-09-13 DIAGNOSIS — Z9981 Dependence on supplemental oxygen: Secondary | ICD-10-CM | POA: Insufficient documentation

## 2017-09-13 DIAGNOSIS — Z90710 Acquired absence of both cervix and uterus: Secondary | ICD-10-CM | POA: Diagnosis not present

## 2017-09-13 DIAGNOSIS — Z006 Encounter for examination for normal comparison and control in clinical research program: Secondary | ICD-10-CM | POA: Diagnosis not present

## 2017-09-13 DIAGNOSIS — I110 Hypertensive heart disease with heart failure: Secondary | ICD-10-CM | POA: Insufficient documentation

## 2017-09-13 DIAGNOSIS — G4733 Obstructive sleep apnea (adult) (pediatric): Secondary | ICD-10-CM | POA: Insufficient documentation

## 2017-09-13 HISTORY — PX: LEAD REVISION/REPAIR: EP1213

## 2017-09-13 LAB — SURGICAL PCR SCREEN
MRSA, PCR: NEGATIVE
Staphylococcus aureus: NEGATIVE

## 2017-09-13 SURGERY — LEAD REVISION/REPAIR
Anesthesia: LOCAL

## 2017-09-13 MED ORDER — SODIUM CHLORIDE 0.9 % IV SOLN
INTRAVENOUS | Status: AC
  Filled 2017-09-13: qty 2

## 2017-09-13 MED ORDER — VANCOMYCIN HCL IN DEXTROSE 1-5 GM/200ML-% IV SOLN
1000.0000 mg | INTRAVENOUS | Status: AC
  Administered 2017-09-13: 1000 mg via INTRAVENOUS

## 2017-09-13 MED ORDER — MUPIROCIN 2 % EX OINT
1.0000 "application " | TOPICAL_OINTMENT | Freq: Once | CUTANEOUS | Status: AC
  Administered 2017-09-13: 1 via TOPICAL

## 2017-09-13 MED ORDER — MIDAZOLAM HCL 5 MG/5ML IJ SOLN
INTRAMUSCULAR | Status: DC | PRN
  Administered 2017-09-13 (×2): 1 mg via INTRAVENOUS

## 2017-09-13 MED ORDER — HEPARIN (PORCINE) IN NACL 1000-0.9 UT/500ML-% IV SOLN
INTRAVENOUS | Status: DC | PRN
  Administered 2017-09-13: 500 mL

## 2017-09-13 MED ORDER — ACETAMINOPHEN 325 MG PO TABS
ORAL_TABLET | ORAL | Status: AC
  Filled 2017-09-13: qty 2

## 2017-09-13 MED ORDER — LIDOCAINE HCL (PF) 1 % IJ SOLN
INTRAMUSCULAR | Status: DC | PRN
  Administered 2017-09-13: 90 mL

## 2017-09-13 MED ORDER — CHLORHEXIDINE GLUCONATE 4 % EX LIQD: 60.0000 mL | Freq: Once | CUTANEOUS | Status: DC

## 2017-09-13 MED ORDER — MIDAZOLAM HCL 5 MG/5ML IJ SOLN
INTRAMUSCULAR | Status: AC
  Filled 2017-09-13: qty 5

## 2017-09-13 MED ORDER — HEPARIN (PORCINE) IN NACL 1000-0.9 UT/500ML-% IV SOLN
INTRAVENOUS | Status: AC
  Filled 2017-09-13: qty 500

## 2017-09-13 MED ORDER — SODIUM CHLORIDE 0.9 % IV SOLN
80.0000 mg | INTRAVENOUS | Status: AC
  Administered 2017-09-13: 80 mg

## 2017-09-13 MED ORDER — MUPIROCIN 2 % EX OINT
TOPICAL_OINTMENT | CUTANEOUS | Status: AC
  Administered 2017-09-13: 1 via TOPICAL
  Filled 2017-09-13: qty 22

## 2017-09-13 MED ORDER — ACETAMINOPHEN 325 MG PO TABS
325.0000 mg | ORAL_TABLET | ORAL | Status: DC | PRN
  Administered 2017-09-13: 650 mg via ORAL

## 2017-09-13 MED ORDER — SODIUM CHLORIDE 0.9 % IV SOLN
INTRAVENOUS | Status: DC
  Administered 2017-09-13: 12:00:00 via INTRAVENOUS

## 2017-09-13 MED ORDER — SODIUM CHLORIDE 0.9 % IV SOLN: 250.0000 mL | INTRAVENOUS | Status: DC | PRN

## 2017-09-13 MED ORDER — LIDOCAINE HCL (PF) 1 % IJ SOLN
INTRAMUSCULAR | Status: AC
  Filled 2017-09-13: qty 60

## 2017-09-13 MED ORDER — IOPAMIDOL (ISOVUE-370) INJECTION 76%
INTRAVENOUS | Status: AC
  Filled 2017-09-13: qty 50

## 2017-09-13 MED ORDER — LIDOCAINE HCL (PF) 1 % IJ SOLN
INTRAMUSCULAR | Status: AC
  Filled 2017-09-13: qty 30

## 2017-09-13 MED ORDER — SODIUM CHLORIDE 0.9% FLUSH: 3.0000 mL | Freq: Two times a day (BID) | INTRAVENOUS | Status: DC

## 2017-09-13 MED ORDER — IOPAMIDOL (ISOVUE-370) INJECTION 76%
INTRAVENOUS | Status: DC | PRN
  Administered 2017-09-13: 15 mL via INTRAVENOUS

## 2017-09-13 MED ORDER — SODIUM CHLORIDE 0.9% FLUSH: 3.0000 mL | INTRAVENOUS | Status: DC | PRN

## 2017-09-13 MED ORDER — ONDANSETRON HCL 4 MG/2ML IJ SOLN: 4.0000 mg | Freq: Four times a day (QID) | INTRAMUSCULAR | Status: DC | PRN

## 2017-09-13 MED ORDER — VANCOMYCIN HCL IN DEXTROSE 1-5 GM/200ML-% IV SOLN
INTRAVENOUS | Status: AC
  Filled 2017-09-13: qty 200

## 2017-09-13 SURGICAL SUPPLY — 9 items
CABLE SURGICAL S-101-97-12 (CABLE) ×2 IMPLANT
ICD VISIA MRI VR DVFB1D4 (ICD Generator) ×1 IMPLANT
KIT LEAD END CAP (Cap) ×3 IMPLANT
LEAD END CAP (Cap) ×3 IMPLANT
LEAD SPRINT QUAT SEC 6935M-62 (Lead) ×2 IMPLANT
PAD DEFIB LIFELINK (PAD) ×2 IMPLANT
SHEATH CLASSIC 9F (SHEATH) ×2 IMPLANT
TRAY PACEMAKER INSERTION (PACKS) ×2 IMPLANT
VISIA MRI VR DVFB1D4 (ICD Generator) ×2 IMPLANT

## 2017-09-13 NOTE — Discharge Instructions (Signed)
Resume Xarelto on 09/16/17 (Saturday) Leave arm sling on for 25 hours   Pacemaker Battery Change, Care After This sheet gives you information about how to care for yourself after your procedure. Your health care provider may also give you more specific instructions. If you have problems or questions, contact your health care provider. What can I expect after the procedure? After your procedure, it is common to have:  Pain or soreness at the site where the pacemaker was inserted.  Swelling at the site where the pacemaker was inserted.  Follow these instructions at home: Incision care  Keep the incision clean and dry. ? Do not take baths, swim, or use a hot tub until your health care provider approves. ? You may shower the day after your procedure, or as directed by your health care provider. ? Pat the area dry with a clean towel. Do not rub the area. This may cause bleeding.  Follow instructions from your health care provider about how to take care of your incision. Make sure you: ? Wash your hands with soap and water before you change your bandage (dressing). If soap and water are not available, use hand sanitizer. ? Change your dressing as told by your health care provider. ? Leave stitches (sutures), skin glue, or adhesive strips in place. These skin closures may need to stay in place for 2 weeks or longer. If adhesive strip edges start to loosen and curl up, you may trim the loose edges. Do not remove adhesive strips completely unless your health care provider tells you to do that.  Check your incision area every day for signs of infection. Check for: ? More redness, swelling, or pain. ? More fluid or blood. ? Warmth. ? Pus or a bad smell. Activity  Do not lift anything that is heavier than 10 lb (4.5 kg) until your health care provider says it is okay to do so.  For the first 2 weeks, or as long as told by your health care provider: ? Avoid lifting your left arm higher than  your shoulder. ? Be gentle when you move your arms over your head. It is okay to raise your arm to comb your hair. ? Avoid strenuous exercise.  Ask your health care provider when it is okay to: ? Resume your normal activities. ? Return to work or school. ? Resume sexual activity. Eating and drinking  Eat a heart-healthy diet. This should include plenty of fresh fruits and vegetables, whole grains, low-fat dairy products, and lean protein like chicken and fish.  Limit alcohol intake to no more than 1 drink a day for non-pregnant women and 2 drinks a day for men. One drink equals 12 oz of beer, 5 oz of wine, or 1 oz of hard liquor.  Check ingredients and nutrition facts on packaged foods and beverages. Avoid the following types of food: ? Food that is high in salt (sodium). ? Food that is high in saturated fat, like full-fat dairy or red meat. ? Food that is high in trans fat, like fried food. ? Food and drinks that are high in sugar. Lifestyle  Do not use any products that contain nicotine or tobacco, such as cigarettes and e-cigarettes. If you need help quitting, ask your health care provider.  Take steps to manage and control your weight.  Get regular exercise. Aim for 150 minutes of moderate-intensity exercise (such as walking or yoga) or 75 minutes of vigorous exercise (such as running or swimming) each week.  Manage other health problems, such as diabetes or high blood pressure. Ask your health care provider how you can manage these conditions. General instructions  Do not drive for 24 hours after your procedure if you were given a medicine to help you relax (sedative).  Take over-the-counter and prescription medicines only as told by your health care provider.  Avoid putting pressure on the area where the pacemaker was placed.  If you need an MRI after your pacemaker has been placed, be sure to tell the health care provider who orders the MRI that you have a  pacemaker.  Avoid close and prolonged exposure to electrical devices that have strong magnetic fields. These include: ? Cell phones. Avoid keeping them in a pocket near the pacemaker, and try using the ear opposite the pacemaker. ? MP3 players. ? Household appliances, like microwaves. ? Metal detectors. ? Electric generators. ? High-tension wires.  Keep all follow-up visits as directed by your health care provider. This is important. Contact a health care provider if:  You have pain at the incision site that is not relieved by over-the-counter or prescription medicines.  You have any of these around your incision site or coming from it: ? More redness, swelling, or pain. ? Fluid or blood. ? Warmth to the touch. ? Pus or a bad smell.  You have a fever.  You feel brief, occasional palpitations, light-headedness, or any symptoms that you think might be related to your heart. Get help right away if:  You experience chest pain that is different from the pain at the pacemaker site.  You develop a red streak that extends above or below the incision site.  You experience shortness of breath.  You have palpitations or an irregular heartbeat.  You have light-headedness that does not go away quickly.  You faint or have dizzy spells.  Your pulse suddenly drops or increases rapidly and does not return to normal.  You begin to gain weight and your legs and ankles swell. Summary  After your procedure, it is common to have pain, soreness, and some swelling where the pacemaker was inserted.  Make sure to keep your incision clean and dry. Follow instructions from your health care provider about how to take care of your incision.  Check your incision every day for signs of infection, such as more pain or swelling, pus or a bad smell, warmth, or leaking fluid and blood.  Avoid strenuous exercise and lifting your left arm higher than your shoulder for 2 weeks, or as long as told by your  health care provider. This information is not intended to replace advice given to you by your health care provider. Make sure you discuss any questions you have with your health care provider. Document Released: 11/14/2012 Document Revised: 12/17/2015 Document Reviewed: 12/17/2015 Elsevier Interactive Patient Education  2017 [employer].

## 2017-09-13 NOTE — Progress Notes (Addendum)
Pt leaving floor on portable monitor with nurse for chest xray..  Return to unit 1536, no concerns.

## 2017-09-13 NOTE — Interval H&P Note (Signed)
History and Physical Interval Note:  09/13/2017 12:17 PM  [name]  has presented today for surgery, with the diagnosis of ischemic cardiomyopathy  The various methods of treatment have been discussed with the patient and family. After consideration of risks, benefits and other options for treatment, the patient has consented to  Procedure(s): LEAD REVISION/REPAIR (N/A) as a surgical intervention .  The patient's history has been reviewed, patient examined, no change in status, stable for surgery.  I have reviewed the patient's chart and labs.  Questions were answered to the patient's satisfaction.     ICD Criteria  Current LVEF:50%. Within 12 months prior to implant: Yes   Heart failure history: Yes, Class II  Cardiomyopathy history: Yes, Ischemic Cardiomyopathy - Prior MI.  Atrial Fibrillation/Atrial Flutter: No.  Ventricular tachycardia history: No.  Cardiac arrest history: No.  History of syndromes with risk of sudden death: No.  Previous ICD: Yes, Reason for ICD:  Primary prevention.  Current ICD indication: Primary  PPM indication: No.   Class I or II Bradycardia indication present: No  Beta Blocker therapy for 3 or more months: Yes, prescribed.   Ace Inhibitor/ARB therapy for 3 or more months: Yes, prescribed.     Hillis RangeJames Valyn Latchford

## 2017-09-14 ENCOUNTER — Encounter (HOSPITAL_COMMUNITY): Payer: Self-pay | Admitting: Internal Medicine

## 2017-09-18 ENCOUNTER — Telehealth: Payer: Self-pay | Admitting: Internal Medicine

## 2017-09-18 MED ORDER — CARVEDILOL 25 MG PO TABS: 25.0000 mg | ORAL_TABLET | Freq: Two times a day (BID) | ORAL | 1 refills | Status: DC

## 2017-09-18 NOTE — Telephone Encounter (Signed)
New prescription was called into wrong pharmacy. She didn't knwo the name of the medication   [facility] [place]

## 2017-09-18 NOTE — Telephone Encounter (Signed)
Patient was calling regarding pain medication. Advised patient this is not something we had prescribed so we could not refill. Patient verbalized understanding.

## 2017-09-27 ENCOUNTER — Ambulatory Visit (INDEPENDENT_AMBULATORY_CARE_PROVIDER_SITE_OTHER): Payer: Medicare Other | Admitting: *Deleted

## 2017-09-27 DIAGNOSIS — I472 Ventricular tachycardia: Secondary | ICD-10-CM | POA: Diagnosis not present

## 2017-09-27 DIAGNOSIS — Z95810 Presence of automatic (implantable) cardiac defibrillator: Secondary | ICD-10-CM

## 2017-09-27 DIAGNOSIS — I255 Ischemic cardiomyopathy: Secondary | ICD-10-CM

## 2017-09-27 DIAGNOSIS — I4729 Other ventricular tachycardia: Secondary | ICD-10-CM

## 2017-09-27 LAB — CUP PACEART INCLINIC DEVICE CHECK
Battery Remaining Longevity: 138 mo
Brady Statistic RV Percent Paced: 0.01 %
Date Time Interrogation Session: 20190821102601
HighPow Impedance: 67 Ohm
Implantable Lead Location: 753860
Implantable Lead Model: 6935
Lead Channel Impedance Value: 551 Ohm
Lead Channel Pacing Threshold Amplitude: 0.5 V
Lead Channel Setting Pacing Amplitude: 3.5 V
Lead Channel Setting Sensing Sensitivity: 0.3 mV
MDC IDC LEAD IMPLANT DT: 20190807
MDC IDC MSMT BATTERY VOLTAGE: 3.17 V
MDC IDC MSMT LEADCHNL RV IMPEDANCE VALUE: 513 Ohm
MDC IDC MSMT LEADCHNL RV PACING THRESHOLD PULSEWIDTH: 0.4 ms
MDC IDC MSMT LEADCHNL RV SENSING INTR AMPL: 7.75 mV
MDC IDC PG IMPLANT DT: 20190807
MDC IDC SET LEADCHNL RV PACING PULSEWIDTH: 0.4 ms

## 2017-09-27 NOTE — Progress Notes (Signed)
Wound check appointment. Steri-strips removed. Wound without redness or edema. Incision edges approximated, wound well healed. Normal device function. Thresholds, sensing, and impedances consistent with implant measurements. Device programmed at 3.5V for extra safety margin until 3 month visit. Histogram distribution appropriate for patient and level of activity. 2 AF episodes--ECGs appear SR w/ectopy. 1 NSVT episode--19bts, patient asymptomatic, no changes per JA. Patient educated about wound care, arm mobility, lifting restrictions, shock plan, and Carelink monitor. ROV with JA on 12/15/17.

## 2017-10-10 ENCOUNTER — Telehealth: Payer: Self-pay | Admitting: Cardiology

## 2017-10-10 NOTE — Telephone Encounter (Signed)
Attempted to confirm remote transmission with pt. No answer and was unable to leave a message.   

## 2017-10-24 NOTE — Progress Notes (Signed)
No ICM remote transmission received for 10/10/2017 and next ICM transmission scheduled for 11/09/2017.

## 2017-10-27 ENCOUNTER — Other Ambulatory Visit: Payer: Self-pay | Admitting: Internal Medicine

## 2017-10-27 DIAGNOSIS — I255 Ischemic cardiomyopathy: Secondary | ICD-10-CM

## 2017-10-27 NOTE — Telephone Encounter (Signed)
This is a [place] pt °

## 2017-11-09 ENCOUNTER — Ambulatory Visit (INDEPENDENT_AMBULATORY_CARE_PROVIDER_SITE_OTHER): Payer: Medicare Other

## 2017-11-09 DIAGNOSIS — I255 Ischemic cardiomyopathy: Secondary | ICD-10-CM | POA: Diagnosis not present

## 2017-11-09 DIAGNOSIS — Z95810 Presence of automatic (implantable) cardiac defibrillator: Secondary | ICD-10-CM

## 2017-11-09 NOTE — Progress Notes (Signed)
EPIC Encounter for ICM Monitoring  Patient Name: SAMYIA MOTTER is a 72 y.o. female Date: 11/09/2017 Primary Care Physican: Patient, No Pcp Per Primary Cardiologist:Koneswaran Electrophysiologist: Allred Dry Weight:Previous weight 175 lbs       Attempted call to patient and unable to reach.   Transmission reviewed.    Thoracic impedance normal.  Prescribed: Furosemide 20 mg 1 tablet every morning. Potassium 10 mEq 1 tablet daily.   Labs: 09/11/2017 Creatinine 1.02, BUN 15, Potassium 4.3, Sodium 141  Recommendations: Unable to reach.  Follow-up plan: ICM clinic phone appointment on 01/15/2018.   Defib check scheduled in office 12/14/2017 with Dr. Johney Frame.    Copy of ICM check sent to Dr. Johney Frame.   3 month ICM trend: 11/09/2017    1 Year ICM trend:       [name], RN 11/09/2017 9:02 AM

## 2017-11-10 ENCOUNTER — Telehealth: Payer: Self-pay

## 2017-11-10 NOTE — Telephone Encounter (Signed)
Remote ICM transmission received.  Attempted call to patient and mail box is full. 

## 2017-11-28 ENCOUNTER — Encounter: Payer: Self-pay | Admitting: Internal Medicine

## 2017-12-07 ENCOUNTER — Other Ambulatory Visit: Payer: Self-pay | Admitting: Cardiovascular Disease

## 2017-12-07 MED ORDER — RIVAROXABAN 2.5 MG PO TABS: 2.5000 mg | ORAL_TABLET | Freq: Two times a day (BID) | ORAL | 6 refills | Status: DC

## 2017-12-07 NOTE — Telephone Encounter (Signed)
° °  1. Which medications need to be refilled? (please list name of each medication and dose if known)   rivaroxaban (XARELTO) 2.5 MG TABS    2. Which pharmacy/location (including street and city if local pharmacy) is medication to be sent to   [facility]   3. Do they need a 30 day or 90 day supply?

## 2017-12-08 ENCOUNTER — Other Ambulatory Visit: Payer: Self-pay | Admitting: *Deleted

## 2017-12-08 MED ORDER — NITROGLYCERIN 0.4 MG SL SUBL: SUBLINGUAL_TABLET | SUBLINGUAL | 3 refills | Status: DC

## 2017-12-11 ENCOUNTER — Telehealth: Payer: Self-pay

## 2017-12-11 NOTE — Telephone Encounter (Signed)
Attempted to LVM regarding shock from 12/11/17. VM box full.

## 2017-12-11 NOTE — Telephone Encounter (Signed)
Attempted to leave VM regarding shock form 12/11/17 and make sure pt knows to f/u with JA on 12/15/17. Voicemail box is full and unable to accept any new message.

## 2017-12-12 NOTE — Telephone Encounter (Signed)
Attempted to call patient x 1--N/A, unable to LM 

## 2017-12-14 NOTE — Telephone Encounter (Signed)
Late entry:  Attempted to call patient earlier (~1500). Someone claiming not to be patient answered the phone and stated that patient was not home. Person asked if I could call back later.  Called back at 1623. N/A -unable to LVM.  Patient is scheduled for f/u on 12/15/17.

## 2017-12-15 ENCOUNTER — Encounter: Payer: Medicare Other | Admitting: Internal Medicine

## 2017-12-18 ENCOUNTER — Encounter: Payer: Self-pay | Admitting: Internal Medicine

## 2017-12-21 ENCOUNTER — Other Ambulatory Visit: Payer: Self-pay | Admitting: Internal Medicine

## 2017-12-25 ENCOUNTER — Ambulatory Visit: Payer: Medicare Other | Admitting: Cardiovascular Disease

## 2017-12-25 ENCOUNTER — Encounter: Payer: Self-pay | Admitting: Internal Medicine

## 2018-01-15 ENCOUNTER — Ambulatory Visit (INDEPENDENT_AMBULATORY_CARE_PROVIDER_SITE_OTHER): Payer: Medicare Other

## 2018-01-15 DIAGNOSIS — Z95810 Presence of automatic (implantable) cardiac defibrillator: Secondary | ICD-10-CM

## 2018-01-15 DIAGNOSIS — I255 Ischemic cardiomyopathy: Secondary | ICD-10-CM | POA: Diagnosis not present

## 2018-01-15 NOTE — Progress Notes (Signed)
EPIC Encounter for ICM Monitoring  Patient Name: [name] is a 72 y.o. female Date: 01/15/2018 Primary Care Physican: Patient, No Pcp Per Primary Cardiologist:Koneswaran Electrophysiologist: Allred Last Weight: 175lbs Today's Weight: unknown       Attempted call to patient and unable to reach.    Transmission reviewed.    Thoracic impedance abnormal suggesting fluid accumulation but trending back to baseline.   Prescribed:Furosemide 20 mg 1 tablet every morning. Potassium 10 mEq 1 tablet daily.   Labs: 09/11/2017 Creatinine 1.02, BUN 15, Potassium 4.3, Sodium 141  Recommendations: Unable to reach.  Will advise to take Furosemide 20 mg 2 tablets every AM x 2 days and then return to 1 tablet every morning if patient is reached  Follow-up plan: ICM clinic phone appointment on 01/22/2018 to recheck fluid levels.     Copy of ICM check sent to Dr. Johney FrameAllred and Dr Purvis SheffieldKoneswaran.   3 month ICM trend: 01/15/2018    1 Year ICM trend:       Karie SodaLaurie S Short, RN 01/15/2018 11:27 AM

## 2018-01-16 ENCOUNTER — Telehealth: Payer: Self-pay

## 2018-01-16 NOTE — Telephone Encounter (Signed)
Remote ICM transmission received.  Attempted call to patient regarding ICM remote transmission and mail box is full. 

## 2018-01-22 ENCOUNTER — Telehealth: Payer: Self-pay | Admitting: Cardiology

## 2018-01-22 NOTE — Telephone Encounter (Signed)
Attempted to confirm remote transmission with pt. No answer and was unable to leave a message.   

## 2018-02-12 NOTE — Progress Notes (Signed)
No ICM remote transmission received for 01/22/2018 and next ICM transmission scheduled for 03/05/2018.

## 2018-03-05 ENCOUNTER — Ambulatory Visit (INDEPENDENT_AMBULATORY_CARE_PROVIDER_SITE_OTHER): Payer: Medicaid Other

## 2018-03-05 DIAGNOSIS — I255 Ischemic cardiomyopathy: Secondary | ICD-10-CM

## 2018-03-05 DIAGNOSIS — Z95810 Presence of automatic (implantable) cardiac defibrillator: Secondary | ICD-10-CM | POA: Diagnosis not present

## 2018-03-05 NOTE — Progress Notes (Signed)
EPIC Encounter for ICM Monitoring  Patient Name: [name] is a 73 y.o. female Date: 03/05/2018 Primary Care Physican: Patient, No Pcp Per Primary Cardiologist:Koneswaran Electrophysiologist: Allred Last Weight:175lbs Today's Weight: unknown                                                   Transmission reviewed.    Thoracic impedance returned to normal since fluid started accumulating on 01/22/2018.   Prescribed: Furosemide 20 mg 1 tablet every morning. Potassium 10 mEq 1 tablet daily.   Labs: 09/11/2017 Creatinine 1.02, BUN 15, Potassium 4.3, Sodium 141  Recommendations: None  Follow-up plan: ICM clinic phone appointment on 04/09/2018.   Office appointment with Dr Purvis Sheffield 03/20/2018  Copy of ICM check sent to Dr. Johney Frame.   3 month ICM trend: 03/05/2018    1 Year ICM trend:       [name], RN 03/05/2018 4:54 PM

## 2018-03-16 ENCOUNTER — Ambulatory Visit (INDEPENDENT_AMBULATORY_CARE_PROVIDER_SITE_OTHER): Payer: 59

## 2018-03-16 DIAGNOSIS — I255 Ischemic cardiomyopathy: Secondary | ICD-10-CM

## 2018-03-16 DIAGNOSIS — I472 Ventricular tachycardia: Secondary | ICD-10-CM

## 2018-03-16 DIAGNOSIS — I4729 Other ventricular tachycardia: Secondary | ICD-10-CM

## 2018-03-17 LAB — CUP PACEART REMOTE DEVICE CHECK
Battery Voltage: 3.1 V
Brady Statistic RV Percent Paced: 0.03 %
Date Time Interrogation Session: 20200207062303
HighPow Impedance: 82 Ohm
Implantable Lead Model: 6935
Lead Channel Impedance Value: 551 Ohm
Lead Channel Impedance Value: 646 Ohm
Lead Channel Sensing Intrinsic Amplitude: 8.375 mV
Lead Channel Sensing Intrinsic Amplitude: 8.375 mV
Lead Channel Setting Pacing Amplitude: 2 V
MDC IDC LEAD IMPLANT DT: 20190807
MDC IDC LEAD LOCATION: 753860
MDC IDC MSMT BATTERY REMAINING LONGEVITY: 135 mo
MDC IDC MSMT LEADCHNL RV PACING THRESHOLD AMPLITUDE: 0.375 V
MDC IDC MSMT LEADCHNL RV PACING THRESHOLD PULSEWIDTH: 0.4 ms
MDC IDC PG IMPLANT DT: 20190807
MDC IDC SET LEADCHNL RV PACING PULSEWIDTH: 0.4 ms
MDC IDC SET LEADCHNL RV SENSING SENSITIVITY: 0.3 mV

## 2018-03-20 ENCOUNTER — Encounter: Payer: Self-pay | Admitting: Cardiovascular Disease

## 2018-03-20 ENCOUNTER — Ambulatory Visit (INDEPENDENT_AMBULATORY_CARE_PROVIDER_SITE_OTHER): Payer: 59 | Admitting: Cardiovascular Disease

## 2018-03-20 VITALS — BP 138/68 | HR 68 | Ht 61.0 in | Wt 168.0 lb

## 2018-03-20 DIAGNOSIS — Z95810 Presence of automatic (implantable) cardiac defibrillator: Secondary | ICD-10-CM | POA: Diagnosis not present

## 2018-03-20 DIAGNOSIS — I739 Peripheral vascular disease, unspecified: Secondary | ICD-10-CM

## 2018-03-20 DIAGNOSIS — I472 Ventricular tachycardia, unspecified: Secondary | ICD-10-CM

## 2018-03-20 DIAGNOSIS — E785 Hyperlipidemia, unspecified: Secondary | ICD-10-CM | POA: Diagnosis not present

## 2018-03-20 DIAGNOSIS — I5042 Chronic combined systolic (congestive) and diastolic (congestive) heart failure: Secondary | ICD-10-CM | POA: Diagnosis not present

## 2018-03-20 DIAGNOSIS — I10 Essential (primary) hypertension: Secondary | ICD-10-CM

## 2018-03-20 DIAGNOSIS — I4729 Other ventricular tachycardia: Secondary | ICD-10-CM

## 2018-03-20 DIAGNOSIS — I350 Nonrheumatic aortic (valve) stenosis: Secondary | ICD-10-CM

## 2018-03-20 DIAGNOSIS — I25118 Atherosclerotic heart disease of native coronary artery with other forms of angina pectoris: Secondary | ICD-10-CM

## 2018-03-20 MED ORDER — NITROGLYCERIN 0.4 MG SL SUBL: SUBLINGUAL_TABLET | SUBLINGUAL | 3 refills | Status: DC

## 2018-03-20 MED ORDER — LISINOPRIL 10 MG PO TABS: 10.0000 mg | ORAL_TABLET | Freq: Every day | ORAL | 2 refills | Status: DC

## 2018-03-20 NOTE — Patient Instructions (Addendum)
Medication Instructions:   Your physician recommends that you continue on your current medications as directed. Please refer to the Current Medication list given to you today.  Labwork:  Your physician recommends that you return for a FASTING lipid profile & BMET. Please make sure you are fasting for at least 8 hours when you have this done.  Testing/Procedures:  NONE  Follow-Up:  Your physician recommends that you schedule a follow-up appointment in: 4 months.   Any Other Special Instructions Will Be Listed Below (If Applicable).  If you need a refill on your cardiac medications before your next appointment, please call your pharmacy.
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
## 2018-03-27 NOTE — Progress Notes (Signed)
Remote ICD transmission.   

## 2018-04-09 ENCOUNTER — Ambulatory Visit (INDEPENDENT_AMBULATORY_CARE_PROVIDER_SITE_OTHER): Payer: 59

## 2018-04-09 ENCOUNTER — Telehealth: Payer: Self-pay

## 2018-04-09 DIAGNOSIS — I5042 Chronic combined systolic (congestive) and diastolic (congestive) heart failure: Secondary | ICD-10-CM | POA: Diagnosis not present

## 2018-04-09 DIAGNOSIS — Z95810 Presence of automatic (implantable) cardiac defibrillator: Secondary | ICD-10-CM | POA: Diagnosis not present

## 2018-04-09 NOTE — Progress Notes (Signed)
EPIC Encounter for ICM Monitoring  Patient Name: [name] is a 73 y.o. female Date: 04/09/2018 Primary Care Physican: Patient, No Pcp Per Primary Cardiologist:Koneswaran Electrophysiologist: Allred LastWeight:175lbs Today's Weight: unknown  Since 16-Mar-2018:   AF   21 episodes  Time in AF 0.2 hr/day (1.0%)  Attempted call to patient and unable to reach.  Transmission reviewed.    Thoracic impedance normal.  Prescribed: Furosemide 20 mg 1 tablet every morning. Potassium 10 mEq 1 tablet daily.   Labs: 09/11/2017 Creatinine 1.02, BUN 15, Potassium 4.3, Sodium 141  Recommendations:Unable to reach.  Follow-up plan: ICM clinic phone appointment on4/07/2018.   Copy of ICM check sent to Dr.Allred.   3 month ICM trend: 04/09/2018    1 Year ICM trend:       [name], RN 04/09/2018 3:06 PM

## 2018-04-09 NOTE — Telephone Encounter (Signed)
Remote ICM transmission received.  Attempted call to patient regarding ICM remote transmission and mail box is full. 

## 2018-05-14 ENCOUNTER — Other Ambulatory Visit: Payer: Self-pay

## 2018-05-14 ENCOUNTER — Telehealth: Payer: Self-pay

## 2018-05-14 ENCOUNTER — Ambulatory Visit (INDEPENDENT_AMBULATORY_CARE_PROVIDER_SITE_OTHER): Payer: 59

## 2018-05-14 DIAGNOSIS — Z95810 Presence of automatic (implantable) cardiac defibrillator: Secondary | ICD-10-CM | POA: Diagnosis not present

## 2018-05-14 DIAGNOSIS — I5042 Chronic combined systolic (congestive) and diastolic (congestive) heart failure: Secondary | ICD-10-CM

## 2018-05-14 NOTE — Telephone Encounter (Signed)
Remote ICM transmission received.  Attempted call to patient regarding ICM remote transmission and mail box is full. 

## 2018-05-14 NOTE — Progress Notes (Signed)
EPIC Encounter for ICM Monitoring  Patient Name: [name] is a 73 y.o. female Date: 05/14/2018 Primary Care Physican: Patient, No Pcp Per Primary Cardiologist:Koneswaran Electrophysiologist: Allred LastWeight:175lbs Today's Weight: unknown   Attempted call to patient and unable to reach.  Transmission reviewed.    Thoracic impedancenormal.  Prescribed: Furosemide 20 mg 1 tablet every morning. Potassium 10 mEq 1 tablet daily.   Labs: 09/11/2017 Creatinine 1.02, BUN 15, Potassium 4.3, Sodium 141  Recommendations:Unable to reach.  Follow-up plan: ICM clinic phone appointment on5/13/2020.  Copy of ICM check sent to Dr.Allred.  3 month ICM trend: 05/14/2018    1 Year ICM trend:       [name], RN 05/14/2018 11:37 AM

## 2018-06-11 ENCOUNTER — Telehealth: Payer: Self-pay | Admitting: Cardiovascular Disease

## 2018-06-11 NOTE — Telephone Encounter (Signed)
Virtual Visit Pre-Appointment Phone Call  "(Name), I am calling you today to discuss your upcoming appointment. We are currently trying to limit exposure to the virus that causes COVID-19 by seeing patients at home rather than in the office."  "What is the BEST phone number to call the day of the visit?" - [phone]  1. Do you have or have access to (through a family member/friend) a smartphone with video capability that we can use for your visit?" a. If yes - list this number in appt notes as cell (if different from BEST phone #) and list the appointment type as a VIDEO visit in appointment notes b. If no - list the appointment type as a PHONE visit in appointment notes  2. Confirm consent - "In the setting of the current Covid19 crisis, you are scheduled for a (phone or video) visit with your provider on (date) at (time).  Just as we do with many in-office visits, in order for you to participate in this visit, we must obtain consent.  If you'd like, I can send this to your mychart (if signed up) or email for you to review.  Otherwise, I can obtain your verbal consent now.  All virtual visits are billed to your insurance company just like a normal visit would be.  By agreeing to a virtual visit, we'd like you to understand that the technology does not allow for your provider to perform an examination, and thus may limit your provider's ability to fully assess your condition. If your provider identifies any concerns that need to be evaluated in person, we will make arrangements to do so.  Finally, though the technology is pretty good, we cannot assure that it will always work on either your or our end, and in the setting of a video visit, we may have to convert it to a phone-only visit.  In either situation, we cannot ensure that we have a secure connection.  Are you willing to proceed?" STAFF: Did the patient verbally acknowledge consent to telehealth visit? Document YES/NO here:  YES  3. Advise patient to be prepared - "Two hours prior to your appointment, go ahead and check your blood pressure, pulse, oxygen saturation, and your weight (if you have the equipment to check those) and write them all down. When your visit starts, your provider will ask you for this information. If you have an Apple Watch or Kardia device, please plan to have heart rate information ready on the day of your appointment. Please have a pen and paper handy nearby the day of the visit as well."  4. Give patient instructions for MyChart download to smartphone OR Doximity/Doxy.me as below if video visit (depending on what platform provider is using)  5. Inform patient they will receive a phone call 15 minutes prior to their appointment time (may be from unknown caller ID) so they should be prepared to answer    TELEPHONE CALL NOTE  [name] has been deemed a candidate for a follow-up tele-health visit to limit community exposure during the Covid-19 pandemic. I spoke with the patient via phone to ensure availability of phone/video source, confirm preferred email & phone number, and discuss instructions and expectations.  I reminded [name] to be prepared with any vital sign and/or heart rhythm information that could potentially be obtained via home monitoring, at the time of her visit. I reminded [name] to expect a phone call prior to her visit.  [name] 06/11/2018 2:33 PM   INSTRUCTIONS FOR DOWNLOADING THE MYCHART APP TO SMARTPHONE  - The patient must first make sure to have activated MyChart and know their login information - If Apple, go to [employer] and type in MyChart in the search bar and download the app. If Android, ask patient to go to [employer] and type in [place] in the search bar and download the app. The app is free but as with any other app downloads, their phone may require them to verify saved payment information or Apple/Android  password.  - The patient will need to then log into the app with their MyChart username and password, and select [facility] as their healthcare provider to link the account. When it is time for your visit, go to the MyChart app, find appointments, and click Begin Video Visit. Be sure to Select Allow for your device to access the Microphone and Camera for your visit. You will then be connected, and your provider will be with you shortly.  **If they have any issues connecting, or need assistance please contact MyChart service desk [phone])**  **If using a computer, in order to ensure the best quality for their visit they will need to use either of the following Internet Browsers: [employer] or Google Chrome**  IF USING DOXIMITY or DOXY.ME - The patient will receive a link just prior to their visit by text.     FULL LENGTH CONSENT FOR TELE-HEALTH VISIT   I hereby voluntarily request, consent and authorize [facility] and its employed or contracted physicians, physician assistants, nurse practitioners or other licensed health care professionals (the Practitioner), to provide me with telemedicine health care services (the Services") as deemed necessary by the treating Practitioner. I acknowledge and consent to receive the Services by the Practitioner via telemedicine. I understand that the telemedicine visit will involve communicating with the Practitioner through live audiovisual communication technology and the disclosure of certain medical information by electronic transmission. I acknowledge that I have been given the opportunity to request an in-person assessment or other available alternative prior to the telemedicine visit and am voluntarily participating in the telemedicine visit.  I understand that I have the right to withhold or withdraw my consent to the use of telemedicine in the course of my care at any time, without affecting my right to future care or treatment,  and that the Practitioner or I may terminate the telemedicine visit at any time. I understand that I have the right to inspect all information obtained and/or recorded in the course of the telemedicine visit and may receive copies of available information for a reasonable fee.  I understand that some of the potential risks of receiving the Services via telemedicine include:   Delay or interruption in medical evaluation due to technological equipment failure or disruption;  Information transmitted may not be sufficient (e.g. poor resolution of images) to allow for appropriate medical decision making by the Practitioner; and/or   In rare instances, security protocols could fail, causing a breach of personal health information.  Furthermore, I acknowledge that it is my responsibility to provide information about my medical history, conditions and care that is complete and accurate to the best of my ability. I acknowledge that Practitioner's advice, recommendations, and/or decision may be based on factors not within their control, such as incomplete or inaccurate data provided by me or distortions of diagnostic images or specimens that may result from electronic transmissions. I understand that the practice of  medicine is not an Chief Strategy Officer and that Practitioner makes no warranties or guarantees regarding treatment outcomes. I acknowledge that I will receive a copy of this consent concurrently upon execution via email to the email address I last provided but may also request a printed copy by calling the office of [facility].    I understand that my insurance will be billed for this visit.   I have read or had this consent read to me.  I understand the contents of this consent, which adequately explains the benefits and risks of the Services being provided via telemedicine.   I have been provided ample opportunity to ask questions regarding this consent and the Services and have had my questions  answered to my satisfaction.  I give my informed consent for the services to be provided through the use of telemedicine in my medical care  By participating in this telemedicine visit I agree to the above.

## 2018-06-11 NOTE — Telephone Encounter (Signed)
Patient called stating that she has been having shortness of breath. Sweating and pain in her legs. I have added her to 06/12/2018 with Dr. Purvis Sheffield.  She also stated that she took a Nitroglycerin the other day.

## 2018-06-11 NOTE — Telephone Encounter (Signed)
Pt says for the last week has SOB off an on with leg cramps and pain - denies any swelling/weight gain - 2 days ago pt had chest pain and took 2 NTG which relieve pain and denies any chest pain since - pt says BP/HR has been "normal" with home monitor but didn't remember exact readings. Has telehealth app with Dr Purvis Sheffield tomorrow - aware that if symptoms worsen she needs ED evaluation

## 2018-06-12 ENCOUNTER — Telehealth (INDEPENDENT_AMBULATORY_CARE_PROVIDER_SITE_OTHER): Payer: 59 | Admitting: Cardiovascular Disease

## 2018-06-12 ENCOUNTER — Encounter: Payer: Self-pay | Admitting: Cardiovascular Disease

## 2018-06-12 VITALS — Ht 61.0 in | Wt 160.0 lb

## 2018-06-12 DIAGNOSIS — I739 Peripheral vascular disease, unspecified: Secondary | ICD-10-CM

## 2018-06-12 DIAGNOSIS — I25118 Atherosclerotic heart disease of native coronary artery with other forms of angina pectoris: Secondary | ICD-10-CM

## 2018-06-12 DIAGNOSIS — I4729 Other ventricular tachycardia: Secondary | ICD-10-CM

## 2018-06-12 DIAGNOSIS — I5042 Chronic combined systolic (congestive) and diastolic (congestive) heart failure: Secondary | ICD-10-CM | POA: Diagnosis not present

## 2018-06-12 DIAGNOSIS — I472 Ventricular tachycardia, unspecified: Secondary | ICD-10-CM

## 2018-06-12 DIAGNOSIS — Z95810 Presence of automatic (implantable) cardiac defibrillator: Secondary | ICD-10-CM

## 2018-06-12 DIAGNOSIS — I10 Essential (primary) hypertension: Secondary | ICD-10-CM

## 2018-06-12 DIAGNOSIS — I350 Nonrheumatic aortic (valve) stenosis: Secondary | ICD-10-CM

## 2018-06-12 DIAGNOSIS — E785 Hyperlipidemia, unspecified: Secondary | ICD-10-CM

## 2018-06-12 MED ORDER — PANTOPRAZOLE SODIUM 40 MG PO TBEC: DELAYED_RELEASE_TABLET | ORAL | 0 refills | Status: DC

## 2018-06-12 MED ORDER — ROSUVASTATIN CALCIUM 20 MG PO TABS: ORAL_TABLET | ORAL | 7 refills | Status: DC

## 2018-06-12 MED ORDER — POTASSIUM CHLORIDE ER 10 MEQ PO TBCR: 10.0000 meq | EXTENDED_RELEASE_TABLET | Freq: Every day | ORAL | 6 refills | Status: DC

## 2018-06-12 NOTE — Patient Instructions (Addendum)
Medication Instructions:   Your physician recommends that you continue on your current medications as directed. Please refer to the Current Medication list given to you today.  Labwork:  NONE  Testing/Procedures:  NONE  Follow-Up:  Your physician recommends that you schedule a follow-up appointment in: 2 months in the office with Dr. Purvis Sheffield.  Any Other Special Instructions Will Be Listed Below (If Applicable).  If you need a refill on your cardiac medications before your next appointment, please call your pharmacy.
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
## 2018-06-20 ENCOUNTER — Ambulatory Visit (INDEPENDENT_AMBULATORY_CARE_PROVIDER_SITE_OTHER): Payer: 59

## 2018-06-20 ENCOUNTER — Other Ambulatory Visit: Payer: Self-pay

## 2018-06-20 DIAGNOSIS — I5042 Chronic combined systolic (congestive) and diastolic (congestive) heart failure: Secondary | ICD-10-CM | POA: Diagnosis not present

## 2018-06-20 DIAGNOSIS — Z95810 Presence of automatic (implantable) cardiac defibrillator: Secondary | ICD-10-CM | POA: Diagnosis not present

## 2018-06-22 ENCOUNTER — Ambulatory Visit: Payer: 59

## 2018-06-22 ENCOUNTER — Ambulatory Visit (INDEPENDENT_AMBULATORY_CARE_PROVIDER_SITE_OTHER): Payer: 59 | Admitting: *Deleted

## 2018-06-22 ENCOUNTER — Other Ambulatory Visit: Payer: Self-pay

## 2018-06-22 ENCOUNTER — Telehealth: Payer: Self-pay

## 2018-06-22 DIAGNOSIS — I255 Ischemic cardiomyopathy: Secondary | ICD-10-CM | POA: Diagnosis not present

## 2018-06-22 NOTE — Progress Notes (Addendum)
EPIC Encounter for ICM Monitoring  Patient Name: [name] is a 73 y.o. female Date: 06/22/2018 Primary Care Physican: Patient, No Pcp Per  Primary Cardiologist:Koneswaran Electrophysiologist: Allred LastWeight:160lbs    Transmission reviewed.   Thoracic impedancenormal.  Prescribed: Furosemide 20 mg 1 tablet every morning. Potassium 10 mEq 1 tablet daily.   Labs: 09/11/2017 Creatinine 1.02, BUN 15, Potassium 4.3, Sodium 141  Recommendations: None  Follow-up plan: ICM clinic phone appointment on6/15/2020.        Copy of ICM check sent to Dr.Allred.   3 month ICM trend: 06/20/2018    1 Year ICM trend:       [name], RN 06/22/2018 2:56 PM

## 2018-06-22 NOTE — Telephone Encounter (Signed)
Left message for patient to remind of missed remote transmission.  

## 2018-06-25 ENCOUNTER — Encounter: Payer: Self-pay | Admitting: Cardiology

## 2018-06-25 LAB — CUP PACEART REMOTE DEVICE CHECK
Battery Remaining Longevity: 134 mo
Battery Voltage: 3.03 V
Brady Statistic RV Percent Paced: 0.01 %
Date Time Interrogation Session: 20200516204224
HighPow Impedance: 83 Ohm
Implantable Lead Implant Date: 20190807
Implantable Lead Location: 753860
Implantable Lead Model: 6935
Implantable Pulse Generator Implant Date: 20190807
Lead Channel Impedance Value: 551 Ohm
Lead Channel Impedance Value: 646 Ohm
Lead Channel Pacing Threshold Amplitude: 0.5 V
Lead Channel Pacing Threshold Pulse Width: 0.4 ms
Lead Channel Sensing Intrinsic Amplitude: 8.75 mV
Lead Channel Sensing Intrinsic Amplitude: 8.75 mV
Lead Channel Setting Pacing Amplitude: 2 V
Lead Channel Setting Pacing Pulse Width: 0.4 ms
Lead Channel Setting Sensing Sensitivity: 0.3 mV

## 2018-06-25 NOTE — Progress Notes (Signed)
Remote ICD transmission.   

## 2018-06-26 ENCOUNTER — Ambulatory Visit (INDEPENDENT_AMBULATORY_CARE_PROVIDER_SITE_OTHER): Payer: 59

## 2018-06-26 DIAGNOSIS — I5042 Chronic combined systolic (congestive) and diastolic (congestive) heart failure: Secondary | ICD-10-CM

## 2018-06-26 DIAGNOSIS — Z95810 Presence of automatic (implantable) cardiac defibrillator: Secondary | ICD-10-CM

## 2018-06-27 ENCOUNTER — Other Ambulatory Visit: Payer: Self-pay

## 2018-06-29 NOTE — Progress Notes (Signed)
EPIC Encounter for ICM Monitoring  Patient Name: [name] is a 73 y.o. female Date: 06/29/2018 Primary Care Physican: Patient, No Pcp Per Primary Cardiologist:Koneswaran Electrophysiologist: Allred LastWeight:160lbs       Transmission reviewed.   Thoracic impedancenormal.  Prescribed: Furosemide 20 mg 1 tablet every morning. Potassium 10 mEq 1 tablet daily.   Labs: 09/11/2017 Creatinine 1.02, BUN 15, Potassium 4.3, Sodium 141  Recommendations: None  Follow-up plan: ICM clinic phone appointment on6/15/2020.  Copy of ICM check sent to Dr.Allred.    3 month ICM trend: 06/23/2018    1 Year ICM trend:       [name], RN 06/29/2018 12:38 PM

## 2018-07-19 ENCOUNTER — Ambulatory Visit: Payer: 59 | Admitting: Cardiovascular Disease

## 2018-07-23 ENCOUNTER — Ambulatory Visit (INDEPENDENT_AMBULATORY_CARE_PROVIDER_SITE_OTHER): Payer: 59

## 2018-07-23 DIAGNOSIS — I5042 Chronic combined systolic (congestive) and diastolic (congestive) heart failure: Secondary | ICD-10-CM | POA: Diagnosis not present

## 2018-07-23 DIAGNOSIS — Z95810 Presence of automatic (implantable) cardiac defibrillator: Secondary | ICD-10-CM

## 2018-07-24 NOTE — Progress Notes (Signed)
EPIC Encounter for ICM Monitoring  Patient Name: [name] is a 73 y.o. female Date: 07/24/2018 Primary Care Physican: Patient, No Pcp Per Primary [place] Electrophysiologist: Allred LastWeight:160lbs       Transmission reviewed.   Optivol thoracic impedancenormal but days within the last month showing possible fluid accumulation.   Prescribed: Furosemide 20 mg 1 tablet every morning. Potassium 10 mEq 1 tablet daily.   Labs: 09/11/2017 Creatinine 1.02, BUN 15, Potassium 4.3, Sodium 141  Recommendations:None  Follow-up plan: ICM clinic phone appointment on 08/27/2018.  Copy of ICM check sent to Dr.Allred.  3 month ICM trend: 07/23/2018    1 Year ICM trend:       [name], RN 07/24/2018 10:12 AM

## 2018-08-26 ENCOUNTER — Other Ambulatory Visit: Payer: Self-pay | Admitting: Cardiovascular Disease

## 2018-08-26 DIAGNOSIS — I255 Ischemic cardiomyopathy: Secondary | ICD-10-CM

## 2018-08-27 ENCOUNTER — Ambulatory Visit (INDEPENDENT_AMBULATORY_CARE_PROVIDER_SITE_OTHER): Payer: 59

## 2018-08-27 DIAGNOSIS — I5042 Chronic combined systolic (congestive) and diastolic (congestive) heart failure: Secondary | ICD-10-CM

## 2018-08-27 DIAGNOSIS — Z95810 Presence of automatic (implantable) cardiac defibrillator: Secondary | ICD-10-CM | POA: Diagnosis not present

## 2018-08-28 ENCOUNTER — Telehealth: Payer: Self-pay

## 2018-08-28 NOTE — Progress Notes (Signed)
EPIC Encounter for ICM Monitoring  Patient Name: [name] is a 73 y.o. female Date: 08/28/2018 Primary Care Physican: Patient, No Pcp Per Primary [place] Electrophysiologist: Allred LastWeight:160lbs  Attempted call to patient and unable to reach.   Transmission reviewed.   Optivol thoracic impedancesuggesting possible fluid accumulation since 08/20/2018.   Prescribed: Furosemide 20 mg 1 tablet every morning. Potassium 10 mEq 1 tablet daily.   Labs: 09/11/2017 Creatinine 1.02, BUN 15, Potassium 4.3, Sodium 141  Recommendations: Unable to reach.  Follow-up plan: ICM clinic phone appointment on 09/14/2018 to recheck fluid levels.  Copy of ICM check sent to Dr.Allred and Dr Bronson Ing.  Office appt with Dr Bronson Ing 09/04/2018.  3 month ICM trend: 09/04/2018    1 Year ICM trend:       [name], RN 08/28/2018 9:48 AM

## 2018-08-28 NOTE — Telephone Encounter (Signed)
Remote ICM transmission received.  Attempted call to patient regarding ICM remote transmission and mail box was full.

## 2018-09-04 ENCOUNTER — Ambulatory Visit (INDEPENDENT_AMBULATORY_CARE_PROVIDER_SITE_OTHER): Payer: 59 | Admitting: Cardiovascular Disease

## 2018-09-04 ENCOUNTER — Encounter: Payer: Self-pay | Admitting: Cardiovascular Disease

## 2018-09-04 ENCOUNTER — Other Ambulatory Visit: Payer: Self-pay

## 2018-09-04 VITALS — BP 150/70 | HR 104 | Temp 97.8°F | Ht 61.0 in | Wt 169.0 lb

## 2018-09-04 DIAGNOSIS — E785 Hyperlipidemia, unspecified: Secondary | ICD-10-CM

## 2018-09-04 DIAGNOSIS — I10 Essential (primary) hypertension: Secondary | ICD-10-CM | POA: Diagnosis not present

## 2018-09-04 DIAGNOSIS — I350 Nonrheumatic aortic (valve) stenosis: Secondary | ICD-10-CM

## 2018-09-04 DIAGNOSIS — I472 Ventricular tachycardia: Secondary | ICD-10-CM

## 2018-09-04 DIAGNOSIS — I255 Ischemic cardiomyopathy: Secondary | ICD-10-CM

## 2018-09-04 DIAGNOSIS — Z95810 Presence of automatic (implantable) cardiac defibrillator: Secondary | ICD-10-CM | POA: Diagnosis not present

## 2018-09-04 DIAGNOSIS — I5043 Acute on chronic combined systolic (congestive) and diastolic (congestive) heart failure: Secondary | ICD-10-CM | POA: Diagnosis not present

## 2018-09-04 DIAGNOSIS — I4729 Other ventricular tachycardia: Secondary | ICD-10-CM

## 2018-09-04 DIAGNOSIS — I739 Peripheral vascular disease, unspecified: Secondary | ICD-10-CM

## 2018-09-04 MED ORDER — LISINOPRIL 20 MG PO TABS: 20.0000 mg | ORAL_TABLET | Freq: Every day | ORAL | 6 refills | Status: DC

## 2018-09-04 NOTE — Patient Instructions (Addendum)
Medication Instructions:   Increase Lisinopril to 20mg  daily.    Increase Lasix to 40mg  daily x 5 days, then resume previous 20mg  daily dosing.  Increase Potassium to 29meq daily x 5 days, then resume previous 41meq daily dosing.   Continue all other medications.    Labwork:  BMET, FLP -orders given today.  Please do at end of this week.    Reminder:  Nothing to eat or drink after 12 midnight prior to labs.  Office will contact with results via phone or letter.    Testing/Procedures: None   Follow-Up: 3 months   Any Other Special Instructions Will Be Listed Below (If Applicable). List of primary care physicians given today.   If you need a refill on your cardiac medications before your next appointment, please call your pharmacy.
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
## 2018-09-14 ENCOUNTER — Ambulatory Visit (INDEPENDENT_AMBULATORY_CARE_PROVIDER_SITE_OTHER): Payer: 59

## 2018-09-14 DIAGNOSIS — Z95810 Presence of automatic (implantable) cardiac defibrillator: Secondary | ICD-10-CM

## 2018-09-14 DIAGNOSIS — I5042 Chronic combined systolic (congestive) and diastolic (congestive) heart failure: Secondary | ICD-10-CM

## 2018-09-14 NOTE — Progress Notes (Signed)
EPIC Encounter for ICM Monitoring  Patient Name: [name] is a 73 y.o. female Date: 09/14/2018 Primary Care Physican: Patient, No Pcp Per Primary [place] Electrophysiologist: Allred LastWeight:160lbs  Transmission reviewed.  Dr Bronson Ing increased Lasix to 40 mg x 5 days at 7/28 OV.   Optivol thoracic impedancereturned to baseline normal after Lasix was increased x 5 days.   Prescribed: Furosemide 20 mg 1 tablet every morning. Potassium 10 mEq 1 tablet daily.   Labs: 09/11/2017 Creatinine 1.02, BUN 15, Potassium 4.3, Sodium 141  Recommendations: No changes  Follow-up plan: ICM clinic phone appointment on 10/01/2018.  Copy of ICM check sent to Dr.Allred and Dr Bronson Ing.    3 month ICM trend: 09/14/2018    1 Year ICM trend:       [name], RN 09/14/2018 11:41 AM

## 2018-09-21 ENCOUNTER — Ambulatory Visit (INDEPENDENT_AMBULATORY_CARE_PROVIDER_SITE_OTHER): Payer: 59 | Admitting: *Deleted

## 2018-09-21 DIAGNOSIS — I255 Ischemic cardiomyopathy: Secondary | ICD-10-CM

## 2018-09-21 DIAGNOSIS — I4729 Other ventricular tachycardia: Secondary | ICD-10-CM

## 2018-09-21 DIAGNOSIS — I472 Ventricular tachycardia: Secondary | ICD-10-CM

## 2018-09-21 LAB — CUP PACEART REMOTE DEVICE CHECK
Battery Remaining Longevity: 133 mo
Battery Voltage: 3.01 V
Brady Statistic RV Percent Paced: 0.01 %
Date Time Interrogation Session: 20200814041702
HighPow Impedance: 68 Ohm
Implantable Lead Implant Date: 20190807
Implantable Lead Location: 753860
Implantable Lead Model: 6935
Implantable Pulse Generator Implant Date: 20190807
Lead Channel Impedance Value: 456 Ohm
Lead Channel Impedance Value: 551 Ohm
Lead Channel Pacing Threshold Amplitude: 0.5 V
Lead Channel Pacing Threshold Pulse Width: 0.4 ms
Lead Channel Sensing Intrinsic Amplitude: 8 mV
Lead Channel Sensing Intrinsic Amplitude: 8 mV
Lead Channel Setting Pacing Amplitude: 2 V
Lead Channel Setting Pacing Pulse Width: 0.4 ms
Lead Channel Setting Sensing Sensitivity: 0.3 mV

## 2018-09-28 NOTE — Progress Notes (Signed)
Remote ICD transmission.   

## 2018-10-01 ENCOUNTER — Ambulatory Visit (INDEPENDENT_AMBULATORY_CARE_PROVIDER_SITE_OTHER): Payer: 59

## 2018-10-01 DIAGNOSIS — Z95810 Presence of automatic (implantable) cardiac defibrillator: Secondary | ICD-10-CM

## 2018-10-01 DIAGNOSIS — I5042 Chronic combined systolic (congestive) and diastolic (congestive) heart failure: Secondary | ICD-10-CM

## 2018-10-02 NOTE — Progress Notes (Signed)
EPIC Encounter for ICM Monitoring  Patient Name: [name] is a 73 y.o. female Date: 10/02/2018 Primary Care Physican: Patient, No Pcp Per Primary [place] Electrophysiologist: Allred LastWeight:160lbs  Transmission reviewed.     Optivol thoracic impedancenormal.  Prescribed: Furosemide 20 mg 1 tablet every morning. Potassium 10 mEq 1 tablet daily.   Labs: 09/11/2017 Creatinine 1.02, BUN 15, Potassium 4.3, Sodium 141  Recommendations: No changes  Follow-up plan: ICM clinic phone appointment on10/07/2018. 91 day device clinic remote transmission 12/19/2018.  Copy of ICM check sent to Dr.Allred.   3 month ICM trend: 10/01/2018    1 Year ICM trend:       [name], RN 10/02/2018 10:58 AM

## 2018-10-06 ENCOUNTER — Other Ambulatory Visit: Payer: Self-pay | Admitting: Cardiovascular Disease

## 2018-10-31 ENCOUNTER — Encounter: Payer: Self-pay | Admitting: *Deleted

## 2018-11-13 ENCOUNTER — Ambulatory Visit (INDEPENDENT_AMBULATORY_CARE_PROVIDER_SITE_OTHER): Payer: 59

## 2018-11-13 DIAGNOSIS — Z95810 Presence of automatic (implantable) cardiac defibrillator: Secondary | ICD-10-CM

## 2018-11-13 DIAGNOSIS — I5042 Chronic combined systolic (congestive) and diastolic (congestive) heart failure: Secondary | ICD-10-CM | POA: Diagnosis not present

## 2018-11-16 NOTE — Progress Notes (Signed)
EPIC Encounter for ICM Monitoring  Patient Name: [name] is a 73 y.o. female Date: 11/16/2018 Primary Care Physican: Patient, No Pcp Per Primary [place] Electrophysiologist: Allred LastWeight:160lbs  Transmission reviewed.  Optivol thoracic impedancenormal.  Prescribed: Furosemide 20 mg 1 tablet every morning. Potassium 10 mEq 1 tablet daily.   Labs: 09/11/2017 Creatinine 1.02, BUN 15, Potassium 4.3, Sodium 141  Recommendations: No changes  Follow-up plan: ICM clinic phone appointment on11/10/2018. 91 day device clinic remote transmission 12/24/2018.Office visit with Dr Bronson Ing 12/19/2018.  Copy of ICM check sent to Dr.Allred.   3 month ICM trend: 11/13/2018    1 Year ICM trend:       [name] Billings, RN 11/16/2018 2:40 PM

## 2018-11-22 ENCOUNTER — Telehealth: Payer: Self-pay | Admitting: Cardiovascular Disease

## 2018-11-22 NOTE — Telephone Encounter (Signed)
Contacted [facility] per their request. Medication technician called to report the last fill date for lisinopril 20 mg and crestor 20 mg was on 10/18/2018 for 30 day supply. Advised that we would discuss this with her at her next visit and also advised that patient has history of non-compliance.

## 2018-11-22 NOTE — Telephone Encounter (Signed)
Wanting to discuss Prescribed medication - noncoherent

## 2018-12-17 ENCOUNTER — Ambulatory Visit (INDEPENDENT_AMBULATORY_CARE_PROVIDER_SITE_OTHER): Payer: 59

## 2018-12-17 DIAGNOSIS — I5042 Chronic combined systolic (congestive) and diastolic (congestive) heart failure: Secondary | ICD-10-CM

## 2018-12-17 DIAGNOSIS — Z95810 Presence of automatic (implantable) cardiac defibrillator: Secondary | ICD-10-CM | POA: Diagnosis not present

## 2018-12-18 ENCOUNTER — Telehealth: Payer: Self-pay

## 2018-12-18 NOTE — Progress Notes (Signed)
EPIC Encounter for ICM Monitoring  Patient Name: [name] is a 73 y.o. female Date: 12/18/2018 Primary Care Physican: Patient, No Pcp Per Primary [place] Electrophysiologist: Allred LastWeight:160lbs  Attempted call to patient and unable to reach.   Transmission reviewed.   Optivol thoracic impedancenormal.  Prescribed: Furosemide 20 mg 1 tablet every morning. Potassium 10 mEq 1 tablet daily.   Labs: 09/11/2017 Creatinine 1.02, BUN 15, Potassium 4.3, Sodium 141  Recommendations: Unable to reach.    Follow-up plan: ICM clinic phone appointment on12/14/2020. 91 day device clinic remote transmission 12/24/2018.Office visit with Dr Bronson Ing 12/19/2018.  Copy of ICM check sent to Dr.Allred and Dr Bronson Ing since patient has telehealth visit 12/19/2018. .  3 month ICM trend: 12/17/2018    1 Year ICM trend:       [name], RN 12/18/2018 2:37 PM

## 2018-12-18 NOTE — Telephone Encounter (Signed)
Remote ICM transmission received.  Attempted call to patient regarding ICM remote transmission and mail box is full. 

## 2018-12-19 ENCOUNTER — Telehealth: Payer: 59 | Admitting: Cardiovascular Disease

## 2018-12-24 ENCOUNTER — Ambulatory Visit (INDEPENDENT_AMBULATORY_CARE_PROVIDER_SITE_OTHER): Payer: 59 | Admitting: *Deleted

## 2018-12-24 DIAGNOSIS — I255 Ischemic cardiomyopathy: Secondary | ICD-10-CM

## 2018-12-24 DIAGNOSIS — I4729 Other ventricular tachycardia: Secondary | ICD-10-CM

## 2018-12-24 DIAGNOSIS — I472 Ventricular tachycardia: Secondary | ICD-10-CM | POA: Diagnosis not present

## 2018-12-25 LAB — CUP PACEART REMOTE DEVICE CHECK
Battery Remaining Longevity: 131 mo
Battery Voltage: 3.01 V
Brady Statistic RV Percent Paced: 0.01 %
Date Time Interrogation Session: 20201116062404
HighPow Impedance: 80 Ohm
Implantable Lead Implant Date: 20190807
Implantable Lead Location: 753860
Implantable Lead Model: 6935
Implantable Pulse Generator Implant Date: 20190807
Lead Channel Impedance Value: 361 Ohm
Lead Channel Impedance Value: 456 Ohm
Lead Channel Pacing Threshold Amplitude: 0.5 V
Lead Channel Pacing Threshold Pulse Width: 0.4 ms
Lead Channel Sensing Intrinsic Amplitude: 7.25 mV
Lead Channel Sensing Intrinsic Amplitude: 7.25 mV
Lead Channel Setting Pacing Amplitude: 2 V
Lead Channel Setting Pacing Pulse Width: 0.4 ms
Lead Channel Setting Sensing Sensitivity: 0.3 mV

## 2019-01-07 ENCOUNTER — Encounter: Payer: Self-pay | Admitting: Cardiovascular Disease

## 2019-01-07 ENCOUNTER — Telehealth (INDEPENDENT_AMBULATORY_CARE_PROVIDER_SITE_OTHER): Payer: 59 | Admitting: Cardiovascular Disease

## 2019-01-07 VITALS — Ht 61.0 in | Wt 155.0 lb

## 2019-01-07 DIAGNOSIS — I5042 Chronic combined systolic (congestive) and diastolic (congestive) heart failure: Secondary | ICD-10-CM

## 2019-01-07 DIAGNOSIS — I2510 Atherosclerotic heart disease of native coronary artery without angina pectoris: Secondary | ICD-10-CM | POA: Diagnosis not present

## 2019-01-07 DIAGNOSIS — I10 Essential (primary) hypertension: Secondary | ICD-10-CM

## 2019-01-07 DIAGNOSIS — I471 Supraventricular tachycardia: Secondary | ICD-10-CM

## 2019-01-07 DIAGNOSIS — I472 Ventricular tachycardia, unspecified: Secondary | ICD-10-CM

## 2019-01-07 DIAGNOSIS — Z79899 Other long term (current) drug therapy: Secondary | ICD-10-CM

## 2019-01-07 DIAGNOSIS — I110 Hypertensive heart disease with heart failure: Secondary | ICD-10-CM

## 2019-01-07 DIAGNOSIS — I25118 Atherosclerotic heart disease of native coronary artery with other forms of angina pectoris: Secondary | ICD-10-CM

## 2019-01-07 DIAGNOSIS — I350 Nonrheumatic aortic (valve) stenosis: Secondary | ICD-10-CM

## 2019-01-07 DIAGNOSIS — E785 Hyperlipidemia, unspecified: Secondary | ICD-10-CM

## 2019-01-07 DIAGNOSIS — Z7901 Long term (current) use of anticoagulants: Secondary | ICD-10-CM

## 2019-01-07 DIAGNOSIS — I739 Peripheral vascular disease, unspecified: Secondary | ICD-10-CM

## 2019-01-07 DIAGNOSIS — Z95810 Presence of automatic (implantable) cardiac defibrillator: Secondary | ICD-10-CM

## 2019-01-07 DIAGNOSIS — I4729 Other ventricular tachycardia: Secondary | ICD-10-CM
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
## 2019-01-08 NOTE — Patient Instructions (Signed)
Medication Instructions:  Continue all current medications.  Labwork: none  Testing/Procedures: none  Follow-Up: 4 months   Any Other Special Instructions Will Be Listed Below (If Applicable).  If you need a refill on your cardiac medications before your next appointment, please call your pharmacy.\ 

## 2019-01-17 NOTE — Progress Notes (Signed)
Remote ICD transmission.   

## 2019-01-18 ENCOUNTER — Other Ambulatory Visit: Payer: Self-pay | Admitting: *Deleted

## 2019-01-18 MED ORDER — ROSUVASTATIN CALCIUM 20 MG PO TABS: ORAL_TABLET | ORAL | 1 refills | Status: DC

## 2019-01-18 MED ORDER — CARVEDILOL 25 MG PO TABS: 25.0000 mg | ORAL_TABLET | Freq: Two times a day (BID) | ORAL | 1 refills | Status: DC

## 2019-01-21 ENCOUNTER — Ambulatory Visit (INDEPENDENT_AMBULATORY_CARE_PROVIDER_SITE_OTHER): Payer: 59

## 2019-01-21 DIAGNOSIS — I5042 Chronic combined systolic (congestive) and diastolic (congestive) heart failure: Secondary | ICD-10-CM

## 2019-01-21 DIAGNOSIS — Z95810 Presence of automatic (implantable) cardiac defibrillator: Secondary | ICD-10-CM

## 2019-01-21 NOTE — Progress Notes (Signed)
EPIC Encounter for ICM Monitoring  [name] Name: [name] is a 73 y.o. female Date: 01/21/2019 Primary Care Physican: [name], [name] Primary [place] Electrophysiologist: Allred 12/14/2020Weight:160lbs  Spoke with [name].She reports she is doing well but thought she may have some fluid.  She thinks the holiday foods such as ham caused possible fluid retention.  Optivol thoracic impedancesuggesting possible ongoing fluid accumulation.  Prescribed: Furosemide 20 mg 1 tablet every morning. Potassium 10 mEq 1 tablet daily.   Labs: 09/11/2017 Creatinine 1.02, BUN 15, Potassium 4.3, Sodium 141  Recommendations:  She reports she will take 2 Furosemide tablets daily x 1 week as previously instructed at office visit with Dr Bronson Ing.  Advised to take 1 extra Potassium tablet when taking extra Furosemide.  Follow-up plan: ICM clinic phone appointment on12/14/2020. 91 day device clinic remote transmission 12/24/2018.Office visit with Dr Bronson Ing 12/19/2018.  Copy of ICM check sent to Dr.Allred   3 month ICM trend: 01/21/2019    1 Year ICM trend:       [name], RN 01/21/2019 3:30 PM

## 2019-01-28 ENCOUNTER — Ambulatory Visit (INDEPENDENT_AMBULATORY_CARE_PROVIDER_SITE_OTHER): Payer: 59

## 2019-01-28 DIAGNOSIS — Z95810 Presence of automatic (implantable) cardiac defibrillator: Secondary | ICD-10-CM

## 2019-01-28 DIAGNOSIS — I5042 Chronic combined systolic (congestive) and diastolic (congestive) heart failure: Secondary | ICD-10-CM

## 2019-01-28 NOTE — Progress Notes (Signed)
EPIC Encounter for ICM Monitoring  Patient Name: SHARICA ROEDEL is a 73 y.o. female Date: 01/28/2019 Primary Care Physican: Patient, No Pcp Per Primary [place] Electrophysiologist: Allred 12/14/2020Weight:160lbs  Spoke with patient and denies any fluid symptoms.    Optivol thoracic impedancereturned to normal after taking extra Furosemide.   Prescribed: Furosemide 20 mg 1 tablet every morning. Potassium 10 mEq 1 tablet daily.   Labs: 09/11/2017 Creatinine 1.02, BUN 15, Potassium 4.3, Sodium 141  Recommendations: No changes and encouraged to call if experiencing any fluid symptoms.  Follow-up plan: ICM clinic phone appointment on1/25/2021. 91 day device clinic remote transmission 03/25/2019.Office visit with Dr Bronson Ing 06/07/2019.  Copy of ICM check sent to Dr.Allred  3 month ICM trend: 01/28/2019     1 Year ICM trend:       [name], RN 01/28/2019 3:59 PM

## 2019-02-21 ENCOUNTER — Other Ambulatory Visit: Payer: Self-pay | Admitting: Cardiovascular Disease

## 2019-02-21 DIAGNOSIS — I255 Ischemic cardiomyopathy: Secondary | ICD-10-CM

## 2019-02-21 MED ORDER — FUROSEMIDE 20 MG PO TABS: 20.0000 mg | ORAL_TABLET | Freq: Every day | ORAL | 1 refills | Status: DC

## 2019-02-21 NOTE — Telephone Encounter (Signed)
     1. Which medications need to be refilled? (please list name of each medication and dose if known) furosemide (LASIX) 20 MG tablet    2. Which pharmacy/location (including street and city if local pharmacy) is medication to be sent to?  [facility]   3. Do they need a 30 day or 90 day supply?

## 2019-02-21 NOTE — Telephone Encounter (Signed)
Done

## 2019-03-04 ENCOUNTER — Ambulatory Visit (INDEPENDENT_AMBULATORY_CARE_PROVIDER_SITE_OTHER): Payer: 59

## 2019-03-04 DIAGNOSIS — Z95810 Presence of automatic (implantable) cardiac defibrillator: Secondary | ICD-10-CM

## 2019-03-04 DIAGNOSIS — I5042 Chronic combined systolic (congestive) and diastolic (congestive) heart failure: Secondary | ICD-10-CM

## 2019-03-06 ENCOUNTER — Telehealth: Payer: Self-pay

## 2019-03-06 NOTE — Telephone Encounter (Signed)
Remote ICM transmission received.  Attempted call to patient regarding ICM remote transmission and mail box is full. 

## 2019-03-06 NOTE — Progress Notes (Signed)
EPIC Encounter for ICM Monitoring  Patient Name: [name] is a 74 y.o. female Date: 03/06/2019 Primary Care Physican: Patient, No Pcp Per Primary Cardiologist:[place] Electrophysiologist: Allred 12/14/2020Weight:160lbs  Attempted call to patient and unable to reach.   Transmission reviewed.    Optivol thoracic impedancenormal.   Prescribed: Furosemide 20 mg 1 tablet every morning. Potassium 10 mEq 1 tablet daily.   Labs: 09/11/2017 Creatinine 1.02, BUN 15, Potassium 4.3, Sodium 141  Recommendations: Unable to reach.    Follow-up plan: ICM clinic phone appointment on3/02/2019. 91 day device clinic remote transmission 03/25/2019.Office visit with Dr Purvis Sheffield 06/07/2019.  Copy of ICM check sent to Dr.Allred  3 month ICM trend: 03/04/2019    1 Year ICM trend:       [name], RN 03/06/2019 9:25 AM

## 2019-03-25 ENCOUNTER — Ambulatory Visit (INDEPENDENT_AMBULATORY_CARE_PROVIDER_SITE_OTHER): Payer: 59 | Admitting: *Deleted

## 2019-03-25 DIAGNOSIS — I472 Ventricular tachycardia, unspecified: Secondary | ICD-10-CM

## 2019-03-25 LAB — CUP PACEART REMOTE DEVICE CHECK
Battery Remaining Longevity: 129 mo
Battery Voltage: 3.01 V
Brady Statistic RV Percent Paced: 0.01 %
Date Time Interrogation Session: 20210215001704
HighPow Impedance: 72 Ohm
Implantable Lead Implant Date: 20190807
Implantable Lead Location: 753860
Implantable Lead Model: 6935
Implantable Pulse Generator Implant Date: 20190807
Lead Channel Impedance Value: 361 Ohm
Lead Channel Impedance Value: 418 Ohm
Lead Channel Pacing Threshold Amplitude: 0.5 V
Lead Channel Pacing Threshold Pulse Width: 0.4 ms
Lead Channel Sensing Intrinsic Amplitude: 8 mV
Lead Channel Sensing Intrinsic Amplitude: 8 mV
Lead Channel Setting Pacing Amplitude: 2 V
Lead Channel Setting Pacing Pulse Width: 0.4 ms
Lead Channel Setting Sensing Sensitivity: 0.3 mV

## 2019-03-25 NOTE — Progress Notes (Signed)
ICD Remote  

## 2019-03-27 ENCOUNTER — Encounter: Payer: Self-pay | Admitting: *Deleted

## 2019-03-27 ENCOUNTER — Other Ambulatory Visit: Payer: Self-pay | Admitting: *Deleted

## 2019-03-27 DIAGNOSIS — E785 Hyperlipidemia, unspecified: Secondary | ICD-10-CM

## 2019-03-27 DIAGNOSIS — I10 Essential (primary) hypertension: Secondary | ICD-10-CM

## 2019-04-05 ENCOUNTER — Other Ambulatory Visit: Payer: Self-pay | Admitting: Cardiovascular Disease

## 2019-04-05 ENCOUNTER — Other Ambulatory Visit: Payer: Self-pay | Admitting: *Deleted

## 2019-04-05 MED ORDER — NITROGLYCERIN 0.4 MG SL SUBL: SUBLINGUAL_TABLET | SUBLINGUAL | 3 refills | Status: DC

## 2019-04-08 ENCOUNTER — Ambulatory Visit (INDEPENDENT_AMBULATORY_CARE_PROVIDER_SITE_OTHER): Payer: 59

## 2019-04-08 DIAGNOSIS — I5042 Chronic combined systolic (congestive) and diastolic (congestive) heart failure: Secondary | ICD-10-CM | POA: Diagnosis not present

## 2019-04-08 DIAGNOSIS — Z95810 Presence of automatic (implantable) cardiac defibrillator: Secondary | ICD-10-CM | POA: Diagnosis not present

## 2019-04-10 NOTE — Progress Notes (Signed)
EPIC Encounter for ICM Monitoring  Patient Name: [name] is a 74 y.o. female Date: 04/10/2019 Primary Care Physican: Patient, No Pcp Per Primary Cardiologist:[place] Electrophysiologist: Allred LastWeight:160lbs  Transmission reviewed.   Optivol thoracic impedancenormal.  Prescribed: Furosemide 20 mg 1 tablet every morning. Potassium 10 mEq 1 tablet daily.   Labs: 09/11/2017 Creatinine 1.02, BUN 15, Potassium 4.3, Sodium 141  Recommendations: None  Follow-up plan: ICM clinic phone appointment on4/06/2019. 91 day device clinic remote transmission 06/24/2019.Office visit with Dr Koneswaran4/30/2021.  Copy of ICM check sent to Dr.Allred  3 month ICM trend: 04/08/2019    1 Year ICM trend:       [name], RN 04/10/2019 12:07 PM

## 2019-04-26 ENCOUNTER — Other Ambulatory Visit: Payer: Self-pay | Admitting: Cardiovascular Disease

## 2019-05-13 ENCOUNTER — Ambulatory Visit (INDEPENDENT_AMBULATORY_CARE_PROVIDER_SITE_OTHER): Payer: 59

## 2019-05-13 DIAGNOSIS — I5042 Chronic combined systolic (congestive) and diastolic (congestive) heart failure: Secondary | ICD-10-CM

## 2019-05-13 DIAGNOSIS — Z95810 Presence of automatic (implantable) cardiac defibrillator: Secondary | ICD-10-CM | POA: Diagnosis not present

## 2019-05-15 ENCOUNTER — Telehealth: Payer: Self-pay

## 2019-05-15 NOTE — Telephone Encounter (Signed)
Remote ICM transmission received.  Attempted call to patient regarding ICM remote transmission and mail box is full. 

## 2019-05-15 NOTE — Progress Notes (Signed)
EPIC Encounter for ICM Monitoring  Patient Name: [name] is a 74 y.o. female Date: 05/15/2019 Primary Care Physican: Patient, No Pcp Per Primary Cardiologist:[place] Electrophysiologist: Allred LastWeight:160lbs  Attempted call to patient and unable to reach. Transmission reviewed.   Optivol thoracic impedancenormal.  Prescribed: Furosemide 20 mg 1 tablet every morning. Potassium 10 mEq 1 tablet daily.   Labs: 09/11/2017 Creatinine 1.02, BUN 15, Potassium 4.3, Sodium 141  Recommendations:Unable to reach.    Follow-up plan: ICM clinic phone appointment on5/18/2021. 91 day device clinic remote transmission 06/24/2019.Office visit with Dr Koneswaran4/30/2021.  Copy of ICM check sent to Dr.Allred  3 month ICM trend: 05/13/2019    1 Year ICM trend:       [name], RN 05/15/2019 9:27 AM

## 2019-05-25 ENCOUNTER — Other Ambulatory Visit: Payer: Self-pay | Admitting: Cardiovascular Disease

## 2019-06-07 ENCOUNTER — Ambulatory Visit: Payer: Medicaid Other | Admitting: Cardiovascular Disease
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
## 2019-06-10 ENCOUNTER — Ambulatory Visit: Payer: Medicaid Other | Admitting: Family Medicine

## 2019-06-10 DIAGNOSIS — I739 Peripheral vascular disease, unspecified: Secondary | ICD-10-CM

## 2019-06-10 DIAGNOSIS — I10 Essential (primary) hypertension: Secondary | ICD-10-CM

## 2019-06-10 DIAGNOSIS — I2510 Atherosclerotic heart disease of native coronary artery without angina pectoris: Secondary | ICD-10-CM

## 2019-06-10 DIAGNOSIS — Z95810 Presence of automatic (implantable) cardiac defibrillator: Secondary | ICD-10-CM

## 2019-06-10 DIAGNOSIS — E785 Hyperlipidemia, unspecified: Secondary | ICD-10-CM

## 2019-06-10 DIAGNOSIS — I5042 Chronic combined systolic (congestive) and diastolic (congestive) heart failure: Secondary | ICD-10-CM

## 2019-06-19 ENCOUNTER — Ambulatory Visit: Payer: Medicaid Other | Admitting: Family Medicine

## 2019-06-24 ENCOUNTER — Ambulatory Visit (INDEPENDENT_AMBULATORY_CARE_PROVIDER_SITE_OTHER): Payer: 59 | Admitting: *Deleted

## 2019-06-24 DIAGNOSIS — I472 Ventricular tachycardia: Secondary | ICD-10-CM

## 2019-06-24 DIAGNOSIS — I4729 Other ventricular tachycardia: Secondary | ICD-10-CM

## 2019-06-24 LAB — CUP PACEART REMOTE DEVICE CHECK
Battery Remaining Longevity: 127 mo
Battery Voltage: 3 V
Brady Statistic RV Percent Paced: 0.02 %
Date Time Interrogation Session: 20210517012402
HighPow Impedance: 75 Ohm
Implantable Lead Implant Date: 20190807
Implantable Lead Location: 753860
Implantable Lead Model: 6935
Implantable Pulse Generator Implant Date: 20190807
Lead Channel Impedance Value: 456 Ohm
Lead Channel Impedance Value: 532 Ohm
Lead Channel Pacing Threshold Amplitude: 0.5 V
Lead Channel Pacing Threshold Pulse Width: 0.4 ms
Lead Channel Sensing Intrinsic Amplitude: 9.375 mV
Lead Channel Sensing Intrinsic Amplitude: 9.375 mV
Lead Channel Setting Pacing Amplitude: 2 V
Lead Channel Setting Pacing Pulse Width: 0.4 ms
Lead Channel Setting Sensing Sensitivity: 0.3 mV

## 2019-06-24 NOTE — Progress Notes (Signed)
Remote ICD transmission.   

## 2019-06-25 ENCOUNTER — Ambulatory Visit (INDEPENDENT_AMBULATORY_CARE_PROVIDER_SITE_OTHER): Payer: 59

## 2019-06-25 DIAGNOSIS — I5042 Chronic combined systolic (congestive) and diastolic (congestive) heart failure: Secondary | ICD-10-CM | POA: Diagnosis not present

## 2019-06-25 DIAGNOSIS — Z95810 Presence of automatic (implantable) cardiac defibrillator: Secondary | ICD-10-CM

## 2019-06-26 NOTE — Progress Notes (Signed)
EPIC Encounter for ICM Monitoring  Patient Name: [name] is a 74 y.o. female Date: 06/26/2019 Primary Care Physican: Patient, No Pcp Per Primary Cardiologist:Koneswaran Electrophysiologist: Allred LastWeight:160lbs  Transmission reviewed.   Optivol thoracic impedancenormal.  Prescribed: Furosemide 20 mg 1 tablet every morning. Potassium 10 mEq 1 tablet daily.   Labs: 09/11/2017 Creatinine 1.02, BUN 15, Potassium 4.3, Sodium 141  Recommendations:None  Follow-up plan: ICM clinic phone appointment on6/21/2021. 91 day device clinic remote transmission 06/24/2019.Office visit with Dr Allred5/25/2021.  Copy of ICM check sent to Dr.Allred  3 month ICM trend: 06/24/2019    1 Year ICM trend:       [name], RN 06/26/2019 1:24 PM

## 2019-07-05 ENCOUNTER — Encounter: Payer: 59 | Admitting: Internal Medicine

## 2019-07-05 ENCOUNTER — Encounter: Payer: Self-pay | Admitting: Internal Medicine

## 2019-07-10 ENCOUNTER — Other Ambulatory Visit: Payer: Self-pay | Admitting: Cardiovascular Disease

## 2019-07-29 ENCOUNTER — Encounter: Payer: Self-pay | Admitting: Internal Medicine

## 2019-07-29 ENCOUNTER — Ambulatory Visit (INDEPENDENT_AMBULATORY_CARE_PROVIDER_SITE_OTHER): Payer: 59

## 2019-07-29 DIAGNOSIS — I5042 Chronic combined systolic (congestive) and diastolic (congestive) heart failure: Secondary | ICD-10-CM | POA: Diagnosis not present

## 2019-07-29 DIAGNOSIS — Z95810 Presence of automatic (implantable) cardiac defibrillator: Secondary | ICD-10-CM | POA: Diagnosis not present

## 2019-07-31 ENCOUNTER — Telehealth: Payer: Self-pay

## 2019-07-31 NOTE — Progress Notes (Signed)
EPIC Encounter for ICM Monitoring  Patient Name: [name] is a 74 y.o. female Date: 07/31/2019 Primary Care Physican: Patient, No Pcp Per Primary Cardiologist:Koneswaran Electrophysiologist: Allred LastWeight:160lbs      Attempted call to patient and unable to reach.   Transmission reviewed.    Optivol thoracic impedancenormal.  Prescribed:   Furosemide 20 mg 1 tablet every morning.   Potassium 10 mEq 1 tablet daily.   Labs: 09/11/2017 Creatinine 1.02, BUN 15, Potassium 4.3, Sodium 141  Recommendations:Unable to reach.    Next ICM clinic phone appointment due: 09/02/2019.     Next 91 day remote transmission due: 09/23/2019.    Over due to schedule with Dr Johney Frame (last OV was 09/08/2017 & no show at 07/05/2019 appt). Over due to schedule the 4 month f/u with Dr Purvis Sheffield (was due in 04/2019)  Copy of ICM check sent to Dr. Johney Frame.   3 month ICM trend: 07/29/2019    1 Year ICM trend:     [name], RN 07/31/2019 10:48 AM

## 2019-07-31 NOTE — Telephone Encounter (Signed)
Remote ICM transmission received.  Attempted call to patient regarding ICM remote transmission and left detailed message per DPR.  Advised to return call for any fluid symptoms or questions. Next ICM remote transmission scheduled 09/02/2019.   ° ° °

## 2019-08-18 ENCOUNTER — Other Ambulatory Visit: Payer: Self-pay | Admitting: Cardiovascular Disease

## 2019-08-22 ENCOUNTER — Ambulatory Visit: Payer: Medicaid Other | Admitting: Family Medicine

## 2019-09-02 ENCOUNTER — Ambulatory Visit (INDEPENDENT_AMBULATORY_CARE_PROVIDER_SITE_OTHER): Payer: 59

## 2019-09-02 DIAGNOSIS — Z95810 Presence of automatic (implantable) cardiac defibrillator: Secondary | ICD-10-CM

## 2019-09-02 DIAGNOSIS — I5042 Chronic combined systolic (congestive) and diastolic (congestive) heart failure: Secondary | ICD-10-CM

## 2019-09-03 ENCOUNTER — Encounter: Payer: Self-pay | Admitting: Family Medicine

## 2019-09-03 ENCOUNTER — Ambulatory Visit (INDEPENDENT_AMBULATORY_CARE_PROVIDER_SITE_OTHER): Payer: 59 | Admitting: Family Medicine

## 2019-09-03 VITALS — BP 162/88 | HR 98 | Ht 61.0 in | Wt 166.0 lb

## 2019-09-03 DIAGNOSIS — I2510 Atherosclerotic heart disease of native coronary artery without angina pectoris: Secondary | ICD-10-CM | POA: Diagnosis not present

## 2019-09-03 DIAGNOSIS — I10 Essential (primary) hypertension: Secondary | ICD-10-CM | POA: Diagnosis not present

## 2019-09-03 DIAGNOSIS — I5042 Chronic combined systolic (congestive) and diastolic (congestive) heart failure: Secondary | ICD-10-CM

## 2019-09-03 DIAGNOSIS — E782 Mixed hyperlipidemia: Secondary | ICD-10-CM

## 2019-09-03 DIAGNOSIS — Z79899 Other long term (current) drug therapy: Secondary | ICD-10-CM

## 2019-09-03 DIAGNOSIS — Z95810 Presence of automatic (implantable) cardiac defibrillator: Secondary | ICD-10-CM | POA: Diagnosis not present

## 2019-09-03 MED ORDER — ISOSORBIDE MONONITRATE ER 30 MG PO TB24
30.0000 mg | ORAL_TABLET | Freq: Every day | ORAL | 3 refills | Status: AC
Start: 2019-09-03 — End: 2019-12-02

## 2019-09-03 MED ORDER — LISINOPRIL 40 MG PO TABS
40.0000 mg | ORAL_TABLET | Freq: Every day | ORAL | 3 refills | Status: AC
Start: 2019-09-03 — End: 2019-12-02

## 2019-09-03 MED ORDER — ROSUVASTATIN CALCIUM 20 MG PO TABS: 20.0000 mg | ORAL_TABLET | Freq: Every day | ORAL | 2 refills | Status: AC

## 2019-09-03 NOTE — Patient Instructions (Addendum)
Medication Instructions:   Your physician has recommended you make the following change in your medication:   Increase lisinopril to 40 mg by mouth daily  Start isosorbide mononitrate 30 mg daily  Continue other medications the same  Labwork:  Your physician recommends that you return for non-fasting lab work in: 1 week to check your BMET & Mg levels. This may be done at [facility] or [employer] or [employer] ([address] [place]) Monday-Friday from 8:00 am - 4:00 pm. No appointment is needed.  Testing/Procedures:   NONE  Follow-Up:  Your physician recommends that you schedule a follow-up appointment in: 3 months.  Any Other Special Instructions Will Be Listed Below (If Applicable).  If you need a refill on your cardiac medications before your next appointment, please call your pharmacy.
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
## 2019-09-04 ENCOUNTER — Ambulatory Visit: Payer: 59 | Admitting: Gastroenterology

## 2019-09-04 ENCOUNTER — Encounter: Payer: Self-pay | Admitting: Family Medicine

## 2019-09-04 ENCOUNTER — Other Ambulatory Visit: Payer: Self-pay | Admitting: Family Medicine

## 2019-09-04 DIAGNOSIS — I255 Ischemic cardiomyopathy: Secondary | ICD-10-CM

## 2019-09-04 MED ORDER — FUROSEMIDE 20 MG PO TABS: 20.0000 mg | ORAL_TABLET | Freq: Every day | ORAL | 1 refills | Status: AC

## 2019-09-04 NOTE — Progress Notes (Signed)
EPIC Encounter for ICM Monitoring  Patient Name: IVERNA HAMMAC is a 74 y.o. female Date: 09/04/2019 Primary Care Physican: Patient, No Pcp Per Primary Cardiologist:Koneswaran Electrophysiologist: Allred 09/03/2019 Office Weight:166lbs  VT-NS (>4 beats, >222 bpm) 3 High Rate-NS 1                                                          Transmission reviewed.    Optivol thoracic impedancenormal.  Prescribed:   Furosemide 20 mg 1 tablet every morning.   Potassium 10 mEq 1 tablet daily.   Labs: 09/03/2019 Order for BMET to be drawn in 2 weeks. 09/11/2017 Creatinine 1.02, BUN 15, Potassium 4.3, Sodium 141  Recommendations:No changes   Next ICM clinic phone appointment due: 10/07/2019.     Next 91 day remote transmission due: 09/23/2019.    EP/Cardiology Office Visits: 11/08/2019 with Dr. Johney Frame.    Copy of ICM check sent to Dr. Johney Frame.   3 month ICM trend: 09/02/2019    1 Year ICM trend:       [name], RN 09/04/2019 10:23 AM

## 2019-09-04 NOTE — Telephone Encounter (Signed)
    1. Which medications need to be refilled? (please list name of each medication and dose if known)  Lasix   2. Which pharmacy/location (including street and city if local pharmacy) is medication to be sent to? [facility]  3. Do they need a 30 day or 90 day supply?  Patient states that she called [facility]

## 2019-09-13 ENCOUNTER — Encounter: Payer: Self-pay | Admitting: *Deleted

## 2019-09-13 ENCOUNTER — Encounter: Payer: Self-pay | Admitting: Internal Medicine

## 2019-09-13 NOTE — Telephone Encounter (Signed)
This encounter was created in error - please disregard.

## 2019-09-16 ENCOUNTER — Telehealth: Payer: Self-pay | Admitting: *Deleted

## 2019-09-16 ENCOUNTER — Encounter: Payer: Self-pay | Admitting: *Deleted

## 2019-09-16 NOTE — Telephone Encounter (Signed)
-----   Message from Netta Neat., NP sent at 09/15/2019  7:05 PM EDT ----- Labs look good. Continue current medications. Thanks

## 2019-09-16 NOTE — Telephone Encounter (Signed)
Patient informed. 

## 2019-09-23 ENCOUNTER — Ambulatory Visit (INDEPENDENT_AMBULATORY_CARE_PROVIDER_SITE_OTHER): Payer: 59 | Admitting: *Deleted

## 2019-09-23 DIAGNOSIS — I4729 Other ventricular tachycardia: Secondary | ICD-10-CM

## 2019-09-23 DIAGNOSIS — I472 Ventricular tachycardia: Secondary | ICD-10-CM | POA: Diagnosis not present

## 2019-09-23 LAB — CUP PACEART REMOTE DEVICE CHECK
Battery Remaining Longevity: 124 mo
Battery Voltage: 3 V
Brady Statistic RV Percent Paced: 0.01 %
Date Time Interrogation Session: 20210816031603
HighPow Impedance: 76 Ohm
Implantable Lead Implant Date: 20190807
Implantable Lead Location: 753860
Implantable Lead Model: 6935
Implantable Pulse Generator Implant Date: 20190807
Lead Channel Impedance Value: 342 Ohm
Lead Channel Impedance Value: 418 Ohm
Lead Channel Pacing Threshold Amplitude: 0.625 V
Lead Channel Pacing Threshold Pulse Width: 0.4 ms
Lead Channel Sensing Intrinsic Amplitude: 7.5 mV
Lead Channel Sensing Intrinsic Amplitude: 7.5 mV
Lead Channel Setting Pacing Amplitude: 2 V
Lead Channel Setting Pacing Pulse Width: 0.4 ms
Lead Channel Setting Sensing Sensitivity: 0.3 mV

## 2019-09-24 NOTE — Progress Notes (Signed)
Remote ICD transmission.   

## 2019-09-30 ENCOUNTER — Encounter (HOSPITAL_COMMUNITY): Payer: Self-pay

## 2019-09-30 ENCOUNTER — Inpatient Hospital Stay (HOSPITAL_COMMUNITY)
Admission: EM | Admit: 2019-09-30 | Discharge: 2019-10-06 | DRG: 682 | Discharge status: Against Medical Advice | Payer: 59 | Attending: Family Medicine | Admitting: Family Medicine

## 2019-09-30 ENCOUNTER — Emergency Department (HOSPITAL_COMMUNITY): Payer: 59

## 2019-09-30 ENCOUNTER — Ambulatory Visit (INDEPENDENT_AMBULATORY_CARE_PROVIDER_SITE_OTHER): Payer: 59 | Admitting: Gastroenterology

## 2019-09-30 ENCOUNTER — Encounter: Payer: Self-pay | Admitting: Gastroenterology

## 2019-09-30 ENCOUNTER — Other Ambulatory Visit: Payer: Self-pay

## 2019-09-30 DIAGNOSIS — K219 Gastro-esophageal reflux disease without esophagitis: Secondary | ICD-10-CM | POA: Diagnosis present

## 2019-09-30 DIAGNOSIS — E785 Hyperlipidemia, unspecified: Secondary | ICD-10-CM | POA: Diagnosis present

## 2019-09-30 DIAGNOSIS — Z79899 Other long term (current) drug therapy: Secondary | ICD-10-CM

## 2019-09-30 DIAGNOSIS — Y92002 Bathroom of unspecified non-institutional (private) residence single-family (private) house as the place of occurrence of the external cause: Secondary | ICD-10-CM | POA: Diagnosis not present

## 2019-09-30 DIAGNOSIS — Z8249 Family history of ischemic heart disease and other diseases of the circulatory system: Secondary | ICD-10-CM

## 2019-09-30 DIAGNOSIS — J449 Chronic obstructive pulmonary disease, unspecified: Secondary | ICD-10-CM | POA: Diagnosis present

## 2019-09-30 DIAGNOSIS — G4733 Obstructive sleep apnea (adult) (pediatric): Secondary | ICD-10-CM | POA: Diagnosis present

## 2019-09-30 DIAGNOSIS — T796XXA Traumatic ischemia of muscle, initial encounter: Secondary | ICD-10-CM | POA: Diagnosis present

## 2019-09-30 DIAGNOSIS — K5900 Constipation, unspecified: Secondary | ICD-10-CM | POA: Diagnosis not present

## 2019-09-30 DIAGNOSIS — F17210 Nicotine dependence, cigarettes, uncomplicated: Secondary | ICD-10-CM | POA: Diagnosis present

## 2019-09-30 DIAGNOSIS — I10 Essential (primary) hypertension: Secondary | ICD-10-CM | POA: Diagnosis present

## 2019-09-30 DIAGNOSIS — I2510 Atherosclerotic heart disease of native coronary artery without angina pectoris: Secondary | ICD-10-CM | POA: Diagnosis present

## 2019-09-30 DIAGNOSIS — R0689 Other abnormalities of breathing: Secondary | ICD-10-CM

## 2019-09-30 DIAGNOSIS — N19 Unspecified kidney failure: Secondary | ICD-10-CM

## 2019-09-30 DIAGNOSIS — I472 Ventricular tachycardia: Secondary | ICD-10-CM | POA: Diagnosis present

## 2019-09-30 DIAGNOSIS — W06XXXA Fall from bed, initial encounter: Secondary | ICD-10-CM | POA: Diagnosis present

## 2019-09-30 DIAGNOSIS — I255 Ischemic cardiomyopathy: Secondary | ICD-10-CM | POA: Diagnosis present

## 2019-09-30 DIAGNOSIS — R946 Abnormal results of thyroid function studies: Secondary | ICD-10-CM | POA: Diagnosis not present

## 2019-09-30 DIAGNOSIS — E860 Dehydration: Secondary | ICD-10-CM | POA: Diagnosis present

## 2019-09-30 DIAGNOSIS — R0602 Shortness of breath: Secondary | ICD-10-CM

## 2019-09-30 DIAGNOSIS — Z5329 Procedure and treatment not carried out because of patient's decision for other reasons: Secondary | ICD-10-CM | POA: Diagnosis not present

## 2019-09-30 DIAGNOSIS — T40605A Adverse effect of unspecified narcotics, initial encounter: Secondary | ICD-10-CM | POA: Diagnosis not present

## 2019-09-30 DIAGNOSIS — I739 Peripheral vascular disease, unspecified: Secondary | ICD-10-CM | POA: Diagnosis present

## 2019-09-30 DIAGNOSIS — E875 Hyperkalemia: Secondary | ICD-10-CM | POA: Diagnosis present

## 2019-09-30 DIAGNOSIS — Z95810 Presence of automatic (implantable) cardiac defibrillator: Secondary | ICD-10-CM

## 2019-09-30 DIAGNOSIS — Z87440 Personal history of urinary (tract) infections: Secondary | ICD-10-CM

## 2019-09-30 DIAGNOSIS — N289 Disorder of kidney and ureter, unspecified: Secondary | ICD-10-CM

## 2019-09-30 DIAGNOSIS — S20219A Contusion of unspecified front wall of thorax, initial encounter: Secondary | ICD-10-CM | POA: Diagnosis present

## 2019-09-30 DIAGNOSIS — G92 Toxic encephalopathy: Secondary | ICD-10-CM | POA: Diagnosis not present

## 2019-09-30 DIAGNOSIS — I340 Nonrheumatic mitral (valve) insufficiency: Secondary | ICD-10-CM | POA: Diagnosis not present

## 2019-09-30 DIAGNOSIS — R296 Repeated falls: Secondary | ICD-10-CM | POA: Diagnosis present

## 2019-09-30 DIAGNOSIS — I351 Nonrheumatic aortic (valve) insufficiency: Secondary | ICD-10-CM | POA: Diagnosis not present

## 2019-09-30 DIAGNOSIS — Z20822 Contact with and (suspected) exposure to covid-19: Secondary | ICD-10-CM | POA: Diagnosis present

## 2019-09-30 DIAGNOSIS — B182 Chronic viral hepatitis C: Secondary | ICD-10-CM | POA: Insufficient documentation

## 2019-09-30 DIAGNOSIS — S301XXA Contusion of abdominal wall, initial encounter: Secondary | ICD-10-CM | POA: Diagnosis present

## 2019-09-30 DIAGNOSIS — Z886 Allergy status to analgesic agent status: Secondary | ICD-10-CM

## 2019-09-30 DIAGNOSIS — E782 Mixed hyperlipidemia: Secondary | ICD-10-CM | POA: Diagnosis not present

## 2019-09-30 DIAGNOSIS — D649 Anemia, unspecified: Secondary | ICD-10-CM | POA: Diagnosis present

## 2019-09-30 DIAGNOSIS — Z7901 Long term (current) use of anticoagulants: Secondary | ICD-10-CM

## 2019-09-30 DIAGNOSIS — Z888 Allergy status to other drugs, medicaments and biological substances status: Secondary | ICD-10-CM

## 2019-09-30 DIAGNOSIS — N179 Acute kidney failure, unspecified: Principal | ICD-10-CM | POA: Diagnosis present

## 2019-09-30 DIAGNOSIS — G894 Chronic pain syndrome: Secondary | ICD-10-CM | POA: Diagnosis present

## 2019-09-30 DIAGNOSIS — I110 Hypertensive heart disease with heart failure: Secondary | ICD-10-CM | POA: Diagnosis present

## 2019-09-30 DIAGNOSIS — I5042 Chronic combined systolic (congestive) and diastolic (congestive) heart failure: Secondary | ICD-10-CM | POA: Diagnosis present

## 2019-09-30 DIAGNOSIS — R410 Disorientation, unspecified: Secondary | ICD-10-CM | POA: Diagnosis not present

## 2019-09-30 DIAGNOSIS — Y92230 Patient room in hospital as the place of occurrence of the external cause: Secondary | ICD-10-CM | POA: Diagnosis not present

## 2019-09-30 DIAGNOSIS — I4729 Other ventricular tachycardia: Secondary | ICD-10-CM

## 2019-09-30 DIAGNOSIS — I350 Nonrheumatic aortic (valve) stenosis: Secondary | ICD-10-CM | POA: Diagnosis present

## 2019-09-30 DIAGNOSIS — Z880 Allergy status to penicillin: Secondary | ICD-10-CM

## 2019-09-30 LAB — CBC
HCT: 40.6 % (ref 36.0–46.0)
Hemoglobin: 11.9 g/dL — ABNORMAL LOW (ref 12.0–15.0)
MCH: 29.8 pg (ref 26.0–34.0)
MCHC: 29.3 g/dL — ABNORMAL LOW (ref 30.0–36.0)
MCV: 101.8 fL — ABNORMAL HIGH (ref 80.0–100.0)
Platelets: 177 10*3/uL (ref 150–400)
RBC: 3.99 MIL/uL (ref 3.87–5.11)
RDW: 13.2 % (ref 11.5–15.5)
WBC: 10.7 10*3/uL — ABNORMAL HIGH (ref 4.0–10.5)
nRBC: 0 % (ref 0.0–0.2)

## 2019-09-30 LAB — BASIC METABOLIC PANEL
Anion gap: 12 (ref 5–15)
BUN: 85 mg/dL — ABNORMAL HIGH (ref 8–23)
CO2: 18 mmol/L — ABNORMAL LOW (ref 22–32)
Calcium: 7.5 mg/dL — ABNORMAL LOW (ref 8.9–10.3)
Chloride: 104 mmol/L (ref 98–111)
Creatinine, Ser: 8.47 mg/dL — ABNORMAL HIGH (ref 0.44–1.00)
GFR calc Af Amer: 5 mL/min — ABNORMAL LOW (ref >60)
GFR calc non Af Amer: 4 mL/min — ABNORMAL LOW (ref >60)
Glucose, Bld: 79 mg/dL (ref 70–99)
Potassium: 5.5 mmol/L — ABNORMAL HIGH (ref 3.5–5.1)
Sodium: 134 mmol/L — ABNORMAL LOW (ref 135–145)

## 2019-09-30 LAB — PROTIME-INR
INR: 1.2 (ref 0.8–1.2)
Prothrombin Time: 15.2 seconds (ref 11.4–15.2)

## 2019-09-30 LAB — COMPREHENSIVE METABOLIC PANEL
ALT: 39 U/L (ref 0–44)
AST: 132 U/L — ABNORMAL HIGH (ref 15–41)
Albumin: 3.5 g/dL (ref 3.5–5.0)
Alkaline Phosphatase: 42 U/L (ref 38–126)
Anion gap: 11 (ref 5–15)
BUN: 84 mg/dL — ABNORMAL HIGH (ref 8–23)
CO2: 23 mmol/L (ref 22–32)
Calcium: 8.3 mg/dL — ABNORMAL LOW (ref 8.9–10.3)
Chloride: 100 mmol/L (ref 98–111)
Creatinine, Ser: 8.64 mg/dL — ABNORMAL HIGH (ref 0.44–1.00)
GFR calc Af Amer: 5 mL/min — ABNORMAL LOW (ref >60)
GFR calc non Af Amer: 4 mL/min — ABNORMAL LOW (ref >60)
Glucose, Bld: 94 mg/dL (ref 70–99)
Potassium: 6.1 mmol/L — ABNORMAL HIGH (ref 3.5–5.1)
Sodium: 134 mmol/L — ABNORMAL LOW (ref 135–145)
Total Bilirubin: 1.4 mg/dL — ABNORMAL HIGH (ref 0.3–1.2)
Total Protein: 7.3 g/dL (ref 6.5–8.1)

## 2019-09-30 LAB — ABO/RH: ABO/RH(D): A POS

## 2019-09-30 LAB — TYPE AND SCREEN
ABO/RH(D): A POS
Antibody Screen: NEGATIVE

## 2019-09-30 LAB — CK: Total CK: 2469 U/L — ABNORMAL HIGH (ref 38–234)

## 2019-09-30 LAB — LIPASE, BLOOD: Lipase: 41 U/L (ref 11–51)

## 2019-09-30 LAB — SARS CORONAVIRUS 2 BY RT PCR (HOSPITAL ORDER, PERFORMED IN ~~LOC~~ HOSPITAL LAB): SARS Coronavirus 2: NEGATIVE

## 2019-09-30 LAB — HIV ANTIBODY (ROUTINE TESTING W REFLEX): HIV Screen 4th Generation wRfx: NONREACTIVE

## 2019-09-30 LAB — HEPATITIS B SURFACE ANTIGEN: Hepatitis B Surface Ag: NONREACTIVE

## 2019-09-30 LAB — PHOSPHORUS: Phosphorus: 7.7 mg/dL — ABNORMAL HIGH (ref 2.5–4.6)

## 2019-09-30 LAB — APTT: aPTT: 38 seconds — ABNORMAL HIGH (ref 24–36)

## 2019-09-30 MED ORDER — SODIUM CHLORIDE 0.9 % IV SOLN: INTRAVENOUS | Status: DC

## 2019-09-30 MED ORDER — PANTOPRAZOLE SODIUM 40 MG PO TBEC
40.0000 mg | DELAYED_RELEASE_TABLET | Freq: Every day | ORAL | Status: DC
  Administered 2019-09-30 – 2019-10-06 (×6): 40 mg via ORAL
  Filled 2019-09-30 (×8): qty 1

## 2019-09-30 MED ORDER — SODIUM ZIRCONIUM CYCLOSILICATE 5 G PO PACK
10.0000 g | PACK | Freq: Once | ORAL | Status: AC
  Administered 2019-09-30: 10 g via ORAL
  Filled 2019-09-30: qty 2

## 2019-09-30 MED ORDER — ACETAMINOPHEN 325 MG PO TABS
650.0000 mg | ORAL_TABLET | Freq: Four times a day (QID) | ORAL | Status: DC | PRN
  Administered 2019-10-06: 650 mg via ORAL
  Filled 2019-09-30: qty 2

## 2019-09-30 MED ORDER — SODIUM CHLORIDE 0.9 % IV BOLUS
500.0000 mL | Freq: Once | INTRAVENOUS | Status: AC
  Administered 2019-09-30: 500 mL via INTRAVENOUS

## 2019-09-30 MED ORDER — ACETAMINOPHEN 650 MG RE SUPP: 650.0000 mg | Freq: Four times a day (QID) | RECTAL | Status: DC | PRN

## 2019-09-30 MED ORDER — SODIUM POLYSTYRENE SULFONATE 15 GM/60ML PO SUSP: 45.0000 g | Freq: Once | ORAL | Status: DC

## 2019-09-30 NOTE — Progress Notes (Signed)
No pcp per patient 
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
## 2019-09-30 NOTE — Patient Instructions (Addendum)
1. Please take miralax once to twice daily as needed to maintain soft regular bowel movements.  2. Please go to the ER to evaluate your right sided flank pain since fall on xarelto, weakness, blood in urine, blood in the stool. 3. Once you are evaluated in ER, we will move towards further evaluation of Hepatitis C and blood in your stool.  4. DO NOT SHARE RAZORS, TOOTHBRUSHES, NAIL CLIPPERS, NEEDLES. 5. Your husband should consider being tested for Hep C.   Hepatitis C Hepatitis C is a liver infection that is caused by the hepatitis C virus (HCV). The virus infects and causes inflammation in the liver. Hepatitis C can lead to:  A condition where the liver cannot work anymore (liver failure).  Scarring of the liver (cirrhosis).  Liver cancer. People with hepatitis C often do not know for months or years that they have it. This is because they often do not have symptoms or may have only mild symptoms. What are the causes? This condition is caused by HCV. The virus can spread from person to person (is contagious) through:  Contact with an infected person's blood, semen, or vaginal fluids.  Childbirth. A woman who has hepatitis C can pass it to her baby during birth.  Donated blood (blood transfusion) or a donated body organ (organ transplantation) if received in the [place] before 1992. What increases the risk? The following factors may make you more likely to develop this condition:  Having contact with needles or syringes that have HCV on them (are contaminated). Contact may happen while: ? Receiving acupuncture. ? Getting a tattoo. ? Getting a body piercing. ? Injecting drugs.  Having sex with someone who is infected. The virus can spread through vaginal, oral, or anal sex.  Receiving treatment to filter your blood (kidney dialysis).  Having HIV (human immunodeficiency virus) or AIDS (acquired immunodeficiency syndrome).  Having a job that involves contact with blood or  certain other body fluids, such as in health care. What are the signs or symptoms? Symptoms of this condition include:  Tiredness (fatigue).  Loss of appetite.  Nausea or vomiting.  Pain in your abdomen.  Dark yellow urine.  Your skin or the white parts of your eyes turning yellow (jaundice).  Itchy skin.  Light-colored or tan stool.  Joint pain.  Bleeding and bruising that happen often.  Fluid building up in your stomach (ascites). Often, hepatitis C causes no symptoms. How is this diagnosed? This condition is diagnosed with:  Blood tests.  Other tests of how well your liver is working. These may include: ? Magnetic resonance elastography (MRE). This imaging test uses MRI and sound waves to measure liver stiffness. ? Transient elastography. This imaging test uses ultrasound to measure liver stiffness. ? Liver biopsy. This test involves taking a tissue sample from your liver to look at under a microscope. How is this treated? Treatment may depend on how severe your condition is, how long it has lasted, and whether you have liver damage. More testing may be done to figure out the best treatment. Treatment may include:  Taking antiviral medicines and other medicines.  Having follow-up treatments every 6-12 months for infections or other liver problems.  Having liver transplantation. Follow these instructions at home: Medicines  Take over-the-counter and prescription medicines only as told by your health care provider.  If you were prescribed an antiviral medicine, take it as told by your health care provider. Do not stop using the antiviral even if you start  to feel better.  Do not take any new medicines, including over-the-counter medicines or supplements, unless your health care provider approves. Activity  Rest as needed.  Do not have sex unless approved by your health care provider.  Return to your normal activities as told by your health care provider. Ask  your health care provider what activities are safe for you.  Ask your health care provider when you may return to school or work. Eating and drinking   Eat a balanced diet with plenty of fruits and vegetables, whole grains, and lean meats or non-meat proteins (such as beans or tofu).  Drink enough fluids to keep your urine pale yellow.  Do not drink alcohol. General instructions  Do not share toothbrushes, nail clippers, or razors.  Wash your hands often with soap and water for at least 20 seconds. If soap and water are not available, use hand sanitizer.  Cover any cuts or open sores on your skin to prevent spreading HCV.  Avoid swimming or using hot tubs if you have open sores or wounds.  Keep all follow-up visits as told by your health care provider. This is important. You may need follow-up visits every 6-12 months. How is this prevented? There is no vaccine for hepatitis C. The only way to prevent this infection is to lessen your risk of coming into contact with HCV. Make sure you:  Wash your hands often with soap and water for at least 20 seconds.  Do not share needles or syringes.  Use a condom every time you have vaginal, oral, or anal sex. Be sure to use it correctly each time. ? Both females and males should wear condoms. ? Condoms should be kept in place from the beginning to the end of sexual activity. ? Latex condoms should be used, if possible. These offer the best protection.  Avoid handling blood or other body fluids without gloves or other protection.  Avoid getting tattoos or body piercings in shops or other places that are not clean. Where to find more information  Centers for Disease Control and Prevention: SaveSearches.co.nz Contact a health care provider if you:  Have a fever.  Have pain in your abdomen.  Pass dark urine.  Pass light-colored or tan stool.  Have joint pain. Get help right away if you:  Have an increase in fatigue or  weakness.  Lose your appetite.  Cannot eat or drink without vomiting.  Develop jaundice or your jaundice gets worse.  Bruise or bleed easily. Summary  Hepatitis C is a liver infection that is caused by the hepatitis C virus (HCV). This infection can lead to a condition where the liver cannot work anymore (liver failure), scarring of the liver (cirrhosis), or liver cancer.  HCV causes this condition and can spread from person to person (is contagious).  Do not take any medicines, including over-the-counter medicines or supplements, unless your health care provider approves. This information is not intended to replace advice given to you by your health care provider. Make sure you discuss any questions you have with your health care provider. Document Revised: 10/17/2018 Document Reviewed: 09/24/2018 Elsevier Patient Education  2020 [employer].

## 2019-09-30 NOTE — ED Triage Notes (Signed)
Pt states that she is sometimes on O2 at home, pt placed on 2L O2 via [place].
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
## 2019-09-30 NOTE — ED Notes (Signed)
Pt gone to CT 
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
## 2019-09-30 NOTE — ED Triage Notes (Signed)
Pt to er, pt states that she went to her pmd to get her bowels straightened out, states that she is also here because she fell out of bed and she has some abd pain.  Pt states that she fell about three days ago, states that she has been vomiting.  States that when she eats it comes back up.

## 2019-10-01 DIAGNOSIS — I10 Essential (primary) hypertension: Secondary | ICD-10-CM

## 2019-10-01 DIAGNOSIS — E782 Mixed hyperlipidemia: Secondary | ICD-10-CM

## 2019-10-01 DIAGNOSIS — I472 Ventricular tachycardia: Secondary | ICD-10-CM

## 2019-10-01 LAB — PROTEIN ELECTROPHORESIS, SERUM
A/G Ratio: 1 (ref 0.7–1.7)
Albumin ELP: 3.1 g/dL (ref 2.9–4.4)
Alpha-1-Globulin: 0.3 g/dL (ref 0.0–0.4)
Alpha-2-Globulin: 0.7 g/dL (ref 0.4–1.0)
Beta Globulin: 0.8 g/dL (ref 0.7–1.3)
Gamma Globulin: 1.3 g/dL (ref 0.4–1.8)
Globulin, Total: 3.2 g/dL (ref 2.2–3.9)
Total Protein ELP: 6.3 g/dL (ref 6.0–8.5)

## 2019-10-01 LAB — CBC
HCT: 35 % — ABNORMAL LOW (ref 36.0–46.0)
Hemoglobin: 9.9 g/dL — ABNORMAL LOW (ref 12.0–15.0)
MCH: 29.4 pg (ref 26.0–34.0)
MCHC: 28.3 g/dL — ABNORMAL LOW (ref 30.0–36.0)
MCV: 103.9 fL — ABNORMAL HIGH (ref 80.0–100.0)
Platelets: 171 10*3/uL (ref 150–400)
RBC: 3.37 MIL/uL — ABNORMAL LOW (ref 3.87–5.11)
RDW: 13.2 % (ref 11.5–15.5)
WBC: 11 10*3/uL — ABNORMAL HIGH (ref 4.0–10.5)
nRBC: 0 % (ref 0.0–0.2)

## 2019-10-01 LAB — COMPREHENSIVE METABOLIC PANEL
ALT: 34 U/L (ref 0–44)
AST: 103 U/L — ABNORMAL HIGH (ref 15–41)
Albumin: 3 g/dL — ABNORMAL LOW (ref 3.5–5.0)
Alkaline Phosphatase: 35 U/L — ABNORMAL LOW (ref 38–126)
Anion gap: 13 (ref 5–15)
BUN: 81 mg/dL — ABNORMAL HIGH (ref 8–23)
CO2: 17 mmol/L — ABNORMAL LOW (ref 22–32)
Calcium: 7.2 mg/dL — ABNORMAL LOW (ref 8.9–10.3)
Chloride: 106 mmol/L (ref 98–111)
Creatinine, Ser: 8.06 mg/dL — ABNORMAL HIGH (ref 0.44–1.00)
GFR calc Af Amer: 5 mL/min — ABNORMAL LOW (ref >60)
GFR calc non Af Amer: 4 mL/min — ABNORMAL LOW (ref >60)
Glucose, Bld: 70 mg/dL (ref 70–99)
Potassium: 4.9 mmol/L (ref 3.5–5.1)
Sodium: 136 mmol/L (ref 135–145)
Total Bilirubin: 1 mg/dL (ref 0.3–1.2)
Total Protein: 6 g/dL — ABNORMAL LOW (ref 6.5–8.1)

## 2019-10-01 LAB — URINALYSIS, ROUTINE W REFLEX MICROSCOPIC
Glucose, UA: NEGATIVE mg/dL
Ketones, ur: NEGATIVE mg/dL
Nitrite: NEGATIVE
Protein, ur: 30 mg/dL — AB
RBC / HPF: >50 RBC/hpf — ABNORMAL HIGH (ref 0–5)
Specific Gravity, Urine: 1.012 (ref 1.005–1.030)
WBC, UA: >50 WBC/hpf — ABNORMAL HIGH (ref 0–5)
pH: 5 (ref 5.0–8.0)

## 2019-10-01 LAB — ANA W/REFLEX IF POSITIVE: Anti Nuclear Antibody (ANA): NEGATIVE

## 2019-10-01 LAB — ANTI-DNA ANTIBODY, DOUBLE-STRANDED: ds DNA Ab: 1 IU/mL (ref 0–9)

## 2019-10-01 LAB — SODIUM, URINE, RANDOM: Sodium, Ur: 40 mmol/L

## 2019-10-01 LAB — KAPPA/LAMBDA LIGHT CHAINS
Kappa free light chain: 178 mg/L — ABNORMAL HIGH (ref 3.3–19.4)
Kappa, lambda light chain ratio: 2.33 — ABNORMAL HIGH (ref 0.26–1.65)
Lambda free light chains: 76.3 mg/L — ABNORMAL HIGH (ref 5.7–26.3)

## 2019-10-01 LAB — PHOSPHORUS: Phosphorus: 7.3 mg/dL — ABNORMAL HIGH (ref 2.5–4.6)

## 2019-10-01 LAB — URIC ACID: Uric Acid, Serum: 8.1 mg/dL — ABNORMAL HIGH (ref 2.5–7.1)

## 2019-10-01 LAB — CREATININE, URINE, RANDOM: Creatinine, Urine: 103.23 mg/dL

## 2019-10-01 LAB — PROTIME-INR
INR: 1.1 (ref 0.8–1.2)
Prothrombin Time: 13.9 seconds (ref 11.4–15.2)

## 2019-10-01 LAB — CK: Total CK: 3290 U/L — ABNORMAL HIGH (ref 38–234)

## 2019-10-01 MED ORDER — SODIUM CHLORIDE 0.9 % IV BOLUS
1000.0000 mL | Freq: Once | INTRAVENOUS | Status: AC
  Administered 2019-10-01: 1000 mL via INTRAVENOUS

## 2019-10-01 NOTE — TOC Initial Note (Signed)
Transition of Care [facility]) - Initial/Assessment Note    Patient Details  Name: [name] MRN: [id number] Date of Birth: 06/20/1945  [facility] CM/SW Contact:    [name] Bleak, RN Phone Number: 10/01/2019, 3:56 PM  Clinical Narrative:    Patient admitted with abnormal thyroid functions.  PT is recommending SNF. TOC spoke with patient she was to sleepy to answer, gave permission to call husband. Husband could not hear on the phone, ask TOC to call daughterRosey [name]. [name] state she will be able to take care of her mother. They want her home with Home health. Patient is not medically ready, Daughter will visit tomorrow. TOC advised daughter to make she patient is not to weak for her to handle at home. Patient lives in [place].  Patient has a 74 year old oxygen tank, rarely uses, If she needs oxygen TOC will need to refer out. TOC to follow.            Expected Discharge Plan: Home w Home Health Services Barriers to Discharge: Continued Medical Work up   Patient Goals and CMS Choice Patient states their goals for this hospitalization and ongoing recovery are:: Daughter wants to take her home with home health.   Choice offered to / list presented to : Adult Children  Expected Discharge Plan and Services Expected Discharge Plan: Home w Home Health Services      Living arrangements for the past 2 months: Single Family Home                    Prior Living Arrangements/Services Living arrangements for the past 2 months: Single Family Home Lives with:: Spouse          Need for Family Participation in Patient Care: Yes (Comment) Care giver support system in place?: Yes (comment)   Criminal Activity/Legal Involvement Pertinent to Current Situation/Hospitalization: No - Comment as needed     Emotional Assessment     Affect (typically observed): Accepting Orientation: : Oriented to Self, Oriented to Place, Oriented to  Time, Oriented to Situation Alcohol /  Substance Use: Not Applicable Psych Involvement: No (comment)  Admission diagnosis:  AKI (acute kidney injury) (HCC) [N17.9] Renal failure, unspecified chronicity [N19] Patient Active Problem List   Diagnosis Date Noted  . Chronic hepatitis C (HCC) 09/30/2019  . Constipation 09/30/2019  . GERD (gastroesophageal reflux disease) 09/30/2019  . AKI (acute kidney injury) (HCC) 09/30/2019  . Cardiomyopathy, ischemic 08/08/2014  . Loose stools 02/12/2014  . Stricture and stenosis of esophagus 02/12/2014  . Helicobacter pylori gastritis 02/12/2014  . Chest pain 09/02/2013  . Abnormal thyroid function test 11/11/2011  . Intermittent claudication (HCC) 10/27/2011  . TOBACCO ABUSE 04/12/2010  . VENTRICULAR TACHYCARDIA 11/06/2009  . CORONARY ATHEROSCLEROSIS NATIVE CORONARY ARTERY 04/28/2009  . LEG PAIN, BILATERAL 04/28/2009  . PRECORDIAL PAIN 04/28/2009  . Implantable cardioverter-defibrillator (ICD) in situ 04/28/2009  . HLD (hyperlipidemia) 11/06/2008  . HTN (hypertension) 11/06/2008  . CARDIOMYOPATHY, SECONDARY 11/06/2008   PCP:  Patient, No Pcp Per Pharmacy:   [facility] [place] - [address] [place] [address] Phone: [phone] Fax: [phone]

## 2019-10-01 NOTE — Progress Notes (Signed)
PT Cancellation Note  Patient Details Name: [name] MRN: [id number] DOB: 07-04-1945   Cancelled Treatment:    Reason Eval/Treat Not Completed: Medical issues which prohibited therapy. Pt just transferred to floor from ED; per RN pt is receiving bolus for low BP. Will continue to check back as schedule permits.    Domenick Bookbinder PT, DPT 10/01/19, 9:38 AM [phone]
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
## 2019-10-01 NOTE — Progress Notes (Signed)
PROGRESS NOTE    KINDLE STROHMEIER  KGY:[id number] DOB: 1945-08-17 DOA: 09/30/2019 PCP: Patient, No Pcp Per    Brief Narrative:   Amaliya Whitelaw  is a 74 y.o. female, with possible chronic hepatitis C. GERD, H. pylori gastritis. chronic combined systolic and diastolic heart failure, ICD, COPD, CAD, hypertension, obstructive sleep apnea, nonsustained ventricular tachycardia. LVEF 50% August 2019, grade 1 diastolic dysfunction and mild aortic stenosis. -Patient presents to ED today secondary to neck and back pain after falls, patient reports has been having unsteady gait, weakness and fatigue for last few days, patient was recently seen at [facility] for hematuria, her CT abdomen pelvis done with no acute findings, her creatinine was at 1.24, her UA did show some proteinuria and large bloods, she was treated with ciprofloxacin, urine cultures were not sent, patient presents to ED secondary to worsening fatigue, weakness, unsteady gait, she denies any chest pain, nausea or vomiting, denies any dysuria, but reports hematuria has resolved.  As well patiently was started to be seen recently by GI for diagnosis of chronic hepatitis C. - in ED her work-up was significant for creatinine of 2.6, potassium of 6.1, EKG showing prolonged QTC at 560, CT abdomen pelvis done showing no obstructive uropathy, no stones, no acute findings, currently her UA remains pending, LFTs elevated including AST of 132, total bili of 1.4, ALT within normal limit, [facility] hospitalist consulted to admit.   Assessment & Plan:   Active Problems:   HLD (hyperlipidemia)   HTN (hypertension)   VENTRICULAR TACHYCARDIA   Implantable cardioverter-defibrillator (ICD) in situ   Abnormal thyroid function test   Cardiomyopathy, ischemic   Chronic hepatitis C (HCC)   GERD (gastroesophageal reflux disease)   AKI (acute kidney injury) (HCC)   1. Acute kidney injury.  Creatinine has risen from 1.24 on 8/12-8.64 on 8/23.  Her  urinalysis shows blood and proteinuria.  She was taking Cipro prior to admission.  This does leave the possibility of AIN.  Nephrology following and has ordered further work-up.  She is receiving IV fluids and has had mild improvement of creatinine since admission.  Renal biopsy is being considered to evaluate for further glomerulonephritis pending serologies. 2. Hyperkalemia.  Secondary to acute kidney injury.  Resolved 3. Rhabdomyolysis.  Mild elevation of CK secondary to immobilization on the floor.  Continue on IV fluids and monitor urine output 4. Hypertension.  Chronically on ACE inhibitor which is been discontinued. 5. Chronic hep C.  Followed by GI as an outpatient 6. Hyperlipidemia.  Resume statin when liver enzymes have recovered 7. Peripheral arterial disease.  Chronically on Xarelto and statin.  These are currently on hold. 8. Nonsustained ventricular tachycardia status post ICD.  Followed by Dr. Johney Frame.  Resume Coreg once blood pressure stabilizes.   DVT prophylaxis: SCDs Start: 09/30/19 1918  Code Status: Full code Family Communication: Updated husband over the phone Disposition Plan: Status is: Inpatient  Remains inpatient appropriate because:Inpatient level of care appropriate due to severity of illness   Dispo: The patient is from: Home              Anticipated d/c is to: SNF              Anticipated d/c date is: 3 days              Patient currently is not medically stable to d/c.  Patient needs continued work-up/treatment of renal failure.     Consultants:   Nephrology  Procedures:  Antimicrobials:       Subjective: Denies any nausea, vomiting, chest pain.  No shortness of breath.  Objective: Vitals:   10/01/19 0900 10/01/19 0957 10/01/19 1258 10/01/19 1700  BP: (!) 85/44 (!) 136/96 (!) 124/92 110/78  Pulse: 91  (!) 103 (!) 105  Resp:      Temp: 97.7 F (36.5 C)  97.6 F (36.4 C) 98.4 F (36.9 C)  TempSrc: Oral  Oral Oral  SpO2: 97%  97% 96%   Weight:      Height:        Intake/Output Summary (Last 24 hours) at 10/01/2019 2015 Last data filed at 10/01/2019 1800 Gross per 24 hour  Intake 1125 ml  Output 300 ml  Net 825 ml   Filed Weights   09/30/19 1118  Weight: 74.8 kg    Examination:  General exam: Appears calm and comfortable  Respiratory system: Clear to auscultation. Respiratory effort normal. Cardiovascular system: S1 & S2 heard, RRR. No JVD, murmurs, rubs, gallops or clicks. No pedal edema. Gastrointestinal system: Abdomen is nondistended, soft and nontender. No organomegaly or masses felt. Normal bowel sounds heard. Central nervous system: Alert and oriented. No focal neurological deficits. Extremities: Symmetric 5 x 5 power. Skin: No rashes, lesions or ulcers Psychiatry: Judgement and insight appear normal. Mood & affect appropriate.     Data Reviewed: I have personally reviewed following labs and imaging studies  CBC: Recent Labs  Lab 09/30/19 1249 10/01/19 0356  WBC 10.7* 11.0*  HGB 11.9* 9.9*  HCT 40.6 35.0*  MCV 101.8* 103.9*  PLT 177 171   Basic Metabolic Panel: Recent Labs  Lab 09/30/19 1249 09/30/19 1739 09/30/19 2035 10/01/19 0356  NA 134*  --  134* 136  K 6.1*  --  5.5* 4.9  CL 100  --  104 106  CO2 23  --  18* 17*  GLUCOSE 94  --  79 70  BUN 84*  --  85* 81*  CREATININE 8.64*  --  8.47* 8.06*  CALCIUM 8.3*  --  7.5* 7.2*  PHOS  --  7.7*  --  7.3*   GFR: Estimated Creatinine Clearance: 5.6 mL/min (A) (by C-G formula based on SCr of 8.06 mg/dL (H)). Liver Function Tests: Recent Labs  Lab 09/30/19 1249 10/01/19 0356  AST 132* 103*  ALT 39 34  ALKPHOS 42 35*  BILITOT 1.4* 1.0  PROT 7.3 6.0*  ALBUMIN 3.5 3.0*   Recent Labs  Lab 09/30/19 1249  LIPASE 41   No results for input(s): AMMONIA in the last 168 hours. Coagulation Profile: Recent Labs  Lab 09/30/19 1739 10/01/19 0356  INR 1.2 1.1   Cardiac Enzymes: Recent Labs  Lab 09/30/19 1249 10/01/19 1235    CKTOTAL 2,469* 3,290*   BNP (last 3 results) No results for input(s): PROBNP in the last 8760 hours. HbA1C: No results for input(s): HGBA1C in the last 72 hours. CBG: No results for input(s): GLUCAP in the last 168 hours. Lipid Profile: No results for input(s): CHOL, HDL, LDLCALC, TRIG, CHOLHDL, LDLDIRECT in the last 72 hours. Thyroid Function Tests: No results for input(s): TSH, T4TOTAL, FREET4, T3FREE, THYROIDAB in the last 72 hours. Anemia Panel: No results for input(s): VITAMINB12, FOLATE, FERRITIN, TIBC, IRON, RETICCTPCT in the last 72 hours. Sepsis Labs: No results for input(s): PROCALCITON, LATICACIDVEN in the last 168 hours.  Recent Results (from the past 240 hour(s))  SARS Coronavirus 2 by RT PCR (hospital order, performed in [facility] hospital lab) Nasopharyngeal Nasopharyngeal Swab  Status: None   Collection Time: 09/30/19  4:28 PM   Specimen: Nasopharyngeal Swab  Result Value Ref Range Status   SARS Coronavirus 2 NEGATIVE NEGATIVE Final    Comment: (NOTE) SARS-CoV-2 target nucleic acids are NOT DETECTED.  The SARS-CoV-2 RNA is generally detectable in upper and lower respiratory specimens during the acute phase of infection. The lowest concentration of SARS-CoV-2 viral copies this assay can detect is 250 copies / mL. A negative result does not preclude SARS-CoV-2 infection and should not be used as the sole basis for treatment or other patient management decisions.  A negative result may occur with improper specimen collection / handling, submission of specimen other than nasopharyngeal swab, presence of viral mutation(s) within the areas targeted by this assay, and inadequate number of viral copies (<250 copies / mL). A negative result must be combined with clinical observations, patient history, and epidemiological information.  Fact Sheet for Patients:   BoilerBrush.com.cyhttps://www.fda.gov/media/136312/download  Fact Sheet for Healthcare  Providers: https://pope.com/https://www.fda.gov/media/136313/download  This test is not yet approved or  cleared by the [place] FDA and has been authorized for detection and/or diagnosis of SARS-CoV-2 by FDA under an Emergency Use Authorization (EUA).  This EUA will remain in effect (meaning this test can be used) for the duration of the COVID-19 declaration under Section 564(b)(1) of the Act, 21 U.S.C. section 360bbb-3(b)(1), unless the authorization is terminated or revoked sooner.  Performed at [facility], [address] [place], [place] [id number]          Radiology Studies: CT ABDOMEN PELVIS WO CONTRAST  Result Date: 09/30/2019 CLINICAL DATA:  Fall from bed 3 days ago with persistent right-sided pain, initial encounter EXAM: CT CHEST, ABDOMEN AND PELVIS WITHOUT CONTRAST TECHNIQUE: Multidetector CT imaging of the chest, abdomen and pelvis was performed following the standard protocol without IV contrast. COMPARISON:  None. FINDINGS: CT CHEST FINDINGS Cardiovascular: Atherosclerotic calcifications of the thoracic aorta are noted. Coronary calcifications are seen. No cardiac enlargement is noted. Pacing device is seen. The pulmonary artery shows normal caliber. Mediastinum/Nodes: Thoracic inlet is within normal limits. No hilar or mediastinal adenopathy is noted. The esophagus as visualized is within normal limits. Lungs/Pleura: The lungs are well aerated bilaterally. Minimal right lower lobe atelectatic changes are noted. No sizable effusion or pneumothorax is seen. Musculoskeletal: No acute compression deformity is noted. Postsurgical changes in the left humerus are seen. No acute rib abnormality is noted. CT ABDOMEN PELVIS FINDINGS Hepatobiliary: No focal liver abnormality is seen. No gallstones, gallbladder wall thickening, or biliary dilatation. Pancreas: Unremarkable. No pancreatic ductal dilatation or surrounding inflammatory changes. Spleen: Normal in size without focal abnormality.  Adrenals/Urinary Tract: Adrenal glands are unremarkable. Kidneys are well visualized bilaterally. No renal calculi are seen. No obstructive changes are seen. The bladder is decompressed. Stomach/Bowel: The appendix has been surgically removed. No obstructive or inflammatory changes of the colon or small bowel are seen. The stomach is within normal limits. Vascular/Lymphatic: Aortic atherosclerosis. No enlarged abdominal or pelvic lymph nodes. Reproductive: Status post hysterectomy. No adnexal masses. Other: No abdominal wall hernia or abnormality. No abdominopelvic ascites. Musculoskeletal: Degenerative changes of lumbar spine are noted. IMPRESSION: CT of the chest: Mild right lower lobe atelectatic changes. No pneumothorax or effusion is seen. No acute abnormality is noted. CT of the abdomen and pelvis: No acute abnormality seen. Aortic Atherosclerosis (ICD10-I70.0). Electronically Signed   By: Alcide CleverMark  Lukens M.D.   On: 09/30/2019 16:08   CT Head Wo Contrast  Result Date: 09/30/2019 CLINICAL DATA:  Fall out of bed.  Fell 3 days ago. EXAM: CT HEAD WITHOUT CONTRAST TECHNIQUE: Contiguous axial images were obtained from the base of the skull through the vertex without intravenous contrast. COMPARISON:  04/30/2014 FINDINGS: Brain: Mild cerebral atrophy. Mild-to-moderate low density in the periventricular white matter likely related to small vessel disease. No mass lesion, hemorrhage, hydrocephalus, acute infarct, intra-axial, or extra-axial fluid collection. Vascular: No hyperdense vessel or unexpected calcification. Skull: No significant soft tissue swelling.  No skull fracture. Sinuses/Orbits: Normal imaged portions of the orbits and globes. Clear paranasal sinuses and mastoid air cells. Hypoplastic frontal sinuses. Cerumen in the external ear canals. Other: None. IMPRESSION: 1. No acute intracranial abnormality. 2. Cerebral atrophy and small vessel ischemic change. Electronically Signed   By: Jeronimo Greaves M.D.    On: 09/30/2019 16:11   CT Chest Wo Contrast  Result Date: 09/30/2019 CLINICAL DATA:  Fall from bed 3 days ago with persistent right-sided pain, initial encounter EXAM: CT CHEST, ABDOMEN AND PELVIS WITHOUT CONTRAST TECHNIQUE: Multidetector CT imaging of the chest, abdomen and pelvis was performed following the standard protocol without IV contrast. COMPARISON:  None. FINDINGS: CT CHEST FINDINGS Cardiovascular: Atherosclerotic calcifications of the thoracic aorta are noted. Coronary calcifications are seen. No cardiac enlargement is noted. Pacing device is seen. The pulmonary artery shows normal caliber. Mediastinum/Nodes: Thoracic inlet is within normal limits. No hilar or mediastinal adenopathy is noted. The esophagus as visualized is within normal limits. Lungs/Pleura: The lungs are well aerated bilaterally. Minimal right lower lobe atelectatic changes are noted. No sizable effusion or pneumothorax is seen. Musculoskeletal: No acute compression deformity is noted. Postsurgical changes in the left humerus are seen. No acute rib abnormality is noted. CT ABDOMEN PELVIS FINDINGS Hepatobiliary: No focal liver abnormality is seen. No gallstones, gallbladder wall thickening, or biliary dilatation. Pancreas: Unremarkable. No pancreatic ductal dilatation or surrounding inflammatory changes. Spleen: Normal in size without focal abnormality. Adrenals/Urinary Tract: Adrenal glands are unremarkable. Kidneys are well visualized bilaterally. No renal calculi are seen. No obstructive changes are seen. The bladder is decompressed. Stomach/Bowel: The appendix has been surgically removed. No obstructive or inflammatory changes of the colon or small bowel are seen. The stomach is within normal limits. Vascular/Lymphatic: Aortic atherosclerosis. No enlarged abdominal or pelvic lymph nodes. Reproductive: Status post hysterectomy. No adnexal masses. Other: No abdominal wall hernia or abnormality. No abdominopelvic ascites.  Musculoskeletal: Degenerative changes of lumbar spine are noted. IMPRESSION: CT of the chest: Mild right lower lobe atelectatic changes. No pneumothorax or effusion is seen. No acute abnormality is noted. CT of the abdomen and pelvis: No acute abnormality seen. Aortic Atherosclerosis (ICD10-I70.0). Electronically Signed   By: Alcide Clever M.D.   On: 09/30/2019 16:08        Scheduled Meds:  pantoprazole  40 mg Oral Daily   Continuous Infusions:  sodium chloride 125 mL/hr at 10/01/19 0234     LOS: 1 day    Time spent:    Erick Blinks, MD [facility]   If 7PM-7AM, please contact night-coverage [email or web address]  10/01/2019, 8:15 PM

## 2019-10-01 NOTE — Progress Notes (Signed)
Patient ID: [name], female   DOB: 1945/06/10, 74 y.o.   MRN: [id number] S: No complaints this morning O:BP (!) 136/96   Pulse 91   Temp 97.7 F (36.5 C) (Oral)   Resp (!) 22   Ht 5' (1.524 m)   Wt 74.8 kg   SpO2 97%   BMI 32.22 kg/m   Intake/Output Summary (Last 24 hours) at 10/01/2019 1158 Last data filed at 09/30/2019 1834 Gross per 24 hour  Intake 500 ml  Output --  Net 500 ml   Intake/Output: I/O last 3 completed shifts: In: 500 [IV Piggyback:500] Out: -   Intake/Output this shift:  No intake/output data recorded. Weight change:  Gen: WD WN WF in NAD eating breakfast CVS: no rub Resp: cta Abd: +BS, soft, NT/ND Ext: trace pretibial edema  Recent Labs  Lab 09/30/19 1249 09/30/19 1739 09/30/19 2035 10/01/19 0356  NA 134*  --  134* 136  K 6.1*  --  5.5* 4.9  CL 100  --  104 106  CO2 23  --  18* 17*  GLUCOSE 94  --  79 70  BUN 84*  --  85* 81*  CREATININE 8.64*  --  8.47* 8.06*  ALBUMIN 3.5  --   --  3.0*  CALCIUM 8.3*  --  7.5* 7.2*  PHOS  --  7.7*  --  7.3*  AST 132*  --   --  103*  ALT 39  --   --  34   Liver Function Tests: Recent Labs  Lab 09/30/19 1249 10/01/19 0356  AST 132* 103*  ALT 39 34  ALKPHOS 42 35*  BILITOT 1.4* 1.0  PROT 7.3 6.0*  ALBUMIN 3.5 3.0*   Recent Labs  Lab 09/30/19 1249  LIPASE 41   No results for input(s): AMMONIA in the last 168 hours. CBC: Recent Labs  Lab 09/30/19 1249 10/01/19 0356  WBC 10.7* 11.0*  HGB 11.9* 9.9*  HCT 40.6 35.0*  MCV 101.8* 103.9*  PLT 177 171   Cardiac Enzymes: Recent Labs  Lab 09/30/19 1249  CKTOTAL 2,469*   CBG: No results for input(s): GLUCAP in the last 168 hours.  Iron Studies: No results for input(s): IRON, TIBC, TRANSFERRIN, FERRITIN in the last 72 hours. Studies/Results: CT ABDOMEN PELVIS WO CONTRAST  Result Date: 09/30/2019 CLINICAL DATA:  Fall from bed 3 days ago with persistent right-sided pain, initial encounter EXAM: CT CHEST, ABDOMEN AND PELVIS WITHOUT  CONTRAST TECHNIQUE: Multidetector CT imaging of the chest, abdomen and pelvis was performed following the standard protocol without IV contrast. COMPARISON:  None. FINDINGS: CT CHEST FINDINGS Cardiovascular: Atherosclerotic calcifications of the thoracic aorta are noted. Coronary calcifications are seen. No cardiac enlargement is noted. Pacing device is seen. The pulmonary artery shows normal caliber. Mediastinum/Nodes: Thoracic inlet is within normal limits. No hilar or mediastinal adenopathy is noted. The esophagus as visualized is within normal limits. Lungs/Pleura: The lungs are well aerated bilaterally. Minimal right lower lobe atelectatic changes are noted. No sizable effusion or pneumothorax is seen. Musculoskeletal: No acute compression deformity is noted. Postsurgical changes in the left humerus are seen. No acute rib abnormality is noted. CT ABDOMEN PELVIS FINDINGS Hepatobiliary: No focal liver abnormality is seen. No gallstones, gallbladder wall thickening, or biliary dilatation. Pancreas: Unremarkable. No pancreatic ductal dilatation or surrounding inflammatory changes. Spleen: Normal in size without focal abnormality. Adrenals/Urinary Tract: Adrenal glands are unremarkable. Kidneys are well visualized bilaterally. No renal calculi are seen. No obstructive changes are seen. The bladder  is decompressed. Stomach/Bowel: The appendix has been surgically removed. No obstructive or inflammatory changes of the colon or small bowel are seen. The stomach is within normal limits. Vascular/Lymphatic: Aortic atherosclerosis. No enlarged abdominal or pelvic lymph nodes. Reproductive: Status post hysterectomy. No adnexal masses. Other: No abdominal wall hernia or abnormality. No abdominopelvic ascites. Musculoskeletal: Degenerative changes of lumbar spine are noted. IMPRESSION: CT of the chest: Mild right lower lobe atelectatic changes. No pneumothorax or effusion is seen. No acute abnormality is noted. CT of the  abdomen and pelvis: No acute abnormality seen. Aortic Atherosclerosis (ICD10-I70.0). Electronically Signed   By: Alcide Clever M.D.   On: 09/30/2019 16:08   CT Head Wo Contrast  Result Date: 09/30/2019 CLINICAL DATA:  Fall out of bed.  Fell 3 days ago. EXAM: CT HEAD WITHOUT CONTRAST TECHNIQUE: Contiguous axial images were obtained from the base of the skull through the vertex without intravenous contrast. COMPARISON:  04/30/2014 FINDINGS: Brain: Mild cerebral atrophy. Mild-to-moderate low density in the periventricular white matter likely related to small vessel disease. No mass lesion, hemorrhage, hydrocephalus, acute infarct, intra-axial, or extra-axial fluid collection. Vascular: No hyperdense vessel or unexpected calcification. Skull: No significant soft tissue swelling.  No skull fracture. Sinuses/Orbits: Normal imaged portions of the orbits and globes. Clear paranasal sinuses and mastoid air cells. Hypoplastic frontal sinuses. Cerumen in the external ear canals. Other: None. IMPRESSION: 1. No acute intracranial abnormality. 2. Cerebral atrophy and small vessel ischemic change. Electronically Signed   By: Jeronimo Greaves M.D.   On: 09/30/2019 16:11   CT Chest Wo Contrast  Result Date: 09/30/2019 CLINICAL DATA:  Fall from bed 3 days ago with persistent right-sided pain, initial encounter EXAM: CT CHEST, ABDOMEN AND PELVIS WITHOUT CONTRAST TECHNIQUE: Multidetector CT imaging of the chest, abdomen and pelvis was performed following the standard protocol without IV contrast. COMPARISON:  None. FINDINGS: CT CHEST FINDINGS Cardiovascular: Atherosclerotic calcifications of the thoracic aorta are noted. Coronary calcifications are seen. No cardiac enlargement is noted. Pacing device is seen. The pulmonary artery shows normal caliber. Mediastinum/Nodes: Thoracic inlet is within normal limits. No hilar or mediastinal adenopathy is noted. The esophagus as visualized is within normal limits. Lungs/Pleura: The lungs are  well aerated bilaterally. Minimal right lower lobe atelectatic changes are noted. No sizable effusion or pneumothorax is seen. Musculoskeletal: No acute compression deformity is noted. Postsurgical changes in the left humerus are seen. No acute rib abnormality is noted. CT ABDOMEN PELVIS FINDINGS Hepatobiliary: No focal liver abnormality is seen. No gallstones, gallbladder wall thickening, or biliary dilatation. Pancreas: Unremarkable. No pancreatic ductal dilatation or surrounding inflammatory changes. Spleen: Normal in size without focal abnormality. Adrenals/Urinary Tract: Adrenal glands are unremarkable. Kidneys are well visualized bilaterally. No renal calculi are seen. No obstructive changes are seen. The bladder is decompressed. Stomach/Bowel: The appendix has been surgically removed. No obstructive or inflammatory changes of the colon or small bowel are seen. The stomach is within normal limits. Vascular/Lymphatic: Aortic atherosclerosis. No enlarged abdominal or pelvic lymph nodes. Reproductive: Status post hysterectomy. No adnexal masses. Other: No abdominal wall hernia or abnormality. No abdominopelvic ascites. Musculoskeletal: Degenerative changes of lumbar spine are noted. IMPRESSION: CT of the chest: Mild right lower lobe atelectatic changes. No pneumothorax or effusion is seen. No acute abnormality is noted. CT of the abdomen and pelvis: No acute abnormality seen. Aortic Atherosclerosis (ICD10-I70.0). Electronically Signed   By: Alcide Clever M.D.   On: 09/30/2019 16:08   . pantoprazole  40 mg Oral Daily  BMET    Component Value Date/Time   NA 136 10/01/2019 0356   K 4.9 10/01/2019 0356   CL 106 10/01/2019 0356   CO2 17 (L) 10/01/2019 0356   GLUCOSE 70 10/01/2019 0356   BUN 81 (H) 10/01/2019 0356   CREATININE 8.06 (H) 10/01/2019 0356   CREATININE 1.02 (H) 09/11/2017 0823   CALCIUM 7.2 (L) 10/01/2019 0356   GFRNONAA 4 (L) 10/01/2019 0356   GFRAA 5 (L) 10/01/2019 0356   CBC     Component Value Date/Time   WBC 11.0 (H) 10/01/2019 0356   RBC 3.37 (L) 10/01/2019 0356   HGB 9.9 (L) 10/01/2019 0356   HCT 35.0 (L) 10/01/2019 0356   PLT 171 10/01/2019 0356   MCV 103.9 (H) 10/01/2019 0356   MCH 29.4 10/01/2019 0356   MCHC 28.3 (L) 10/01/2019 0356   RDW 13.2 10/01/2019 0356   LYMPHSABS 2.6 04/30/2014 1317   MONOABS 0.6 04/30/2014 1317   EOSABS 0.2 04/30/2014 1317   BASOSABS 0.0 04/30/2014 1317     Assessment/Plan:  1. AKI- Scr has risen from 1.24 on 09/19/19 to 8.64 on 09/30/19.  UA + for blood and proteinuria.  Treated with cipro 2 weeks ago so AIN possible, however she is not longer taking the antibiotic.  Need to rule out acute GN and serologies pending.  CK elevated at 2500 but not enough to account for severe decline in renal function in 2 weeks.  She will need a renal biopsy at [facility] in the next 24-48 hours pending serologies.  No indication for dialysis at this time as renal function slowly improving with IVF's.  2. Hyperkalemia- due to #1 resolved 3. HTN- off ace-inhibitor 4. Falls/deconditioning- will likely require SNF placement after discharge 5. COPD 6. HCV, chronic  7. Anemia- of acute illness 8. Elevated CPK- mild rhabdo- recheck levels  Irena Cords, MD [facility] [phone]

## 2019-10-01 NOTE — Plan of Care (Signed)
  Problem: Acute Rehab PT Goals(only PT should resolve) Goal: Pt Will Go Supine/Side To Sit Outcome: Progressing Flowsheets (Taken 10/01/2019 1226) Pt will go Supine/Side to Sit: with modified independence Goal: Patient Will Transfer Sit To/From Stand Outcome: Progressing Flowsheets (Taken 10/01/2019 1226) Patient will transfer sit to/from stand: with min guard assist Goal: Pt Will Transfer Bed To Chair/Chair To Bed Outcome: Progressing Flowsheets (Taken 10/01/2019 1226) Pt will Transfer Bed to Chair/Chair to Bed: min guard assist Goal: Pt Will Ambulate Outcome: Progressing Flowsheets (Taken 10/01/2019 1226) Pt will Ambulate:  50 feet  with minimal assist  with rolling walker   Tori Karmelo Bass PT, DPT 10/01/19, 12:26 PM [phone]

## 2019-10-01 NOTE — ED Notes (Signed)
Hospitalist made aware of pt low bp. Bolus of NS ordered. Will continue to monitor.

## 2019-10-02 ENCOUNTER — Inpatient Hospital Stay (HOSPITAL_COMMUNITY): Payer: 59

## 2019-10-02 LAB — CBC
HCT: 35.5 % — ABNORMAL LOW (ref 36.0–46.0)
Hemoglobin: 10.1 g/dL — ABNORMAL LOW (ref 12.0–15.0)
MCH: 29 pg (ref 26.0–34.0)
MCHC: 28.5 g/dL — ABNORMAL LOW (ref 30.0–36.0)
MCV: 102 fL — ABNORMAL HIGH (ref 80.0–100.0)
Platelets: 171 10*3/uL (ref 150–400)
RBC: 3.48 MIL/uL — ABNORMAL LOW (ref 3.87–5.11)
RDW: 13.5 % (ref 11.5–15.5)
WBC: 12.1 10*3/uL — ABNORMAL HIGH (ref 4.0–10.5)
nRBC: 0 % (ref 0.0–0.2)

## 2019-10-02 LAB — C4 COMPLEMENT: Complement C4, Body Fluid: 17 mg/dL (ref 12–38)

## 2019-10-02 LAB — RENAL FUNCTION PANEL
Albumin: 3.3 g/dL — ABNORMAL LOW (ref 3.5–5.0)
Anion gap: 12 (ref 5–15)
BUN: 78 mg/dL — ABNORMAL HIGH (ref 8–23)
CO2: 17 mmol/L — ABNORMAL LOW (ref 22–32)
Calcium: 7.6 mg/dL — ABNORMAL LOW (ref 8.9–10.3)
Chloride: 109 mmol/L (ref 98–111)
Creatinine, Ser: 7.51 mg/dL — ABNORMAL HIGH (ref 0.44–1.00)
GFR calc Af Amer: 6 mL/min — ABNORMAL LOW (ref >60)
GFR calc non Af Amer: 5 mL/min — ABNORMAL LOW (ref >60)
Glucose, Bld: 107 mg/dL — ABNORMAL HIGH (ref 70–99)
Phosphorus: 6.4 mg/dL — ABNORMAL HIGH (ref 2.5–4.6)
Potassium: 5.2 mmol/L — ABNORMAL HIGH (ref 3.5–5.1)
Sodium: 138 mmol/L (ref 135–145)

## 2019-10-02 LAB — ANTISTREPTOLYSIN O TITER: ASO: 145 IU/mL (ref 0.0–200.0)

## 2019-10-02 LAB — RHEUMATOID FACTOR: Rheumatoid fact SerPl-aCnc: <10 IU/mL (ref 0.0–13.9)

## 2019-10-02 LAB — GLOMERULAR BASEMENT MEMBRANE ANTIBODIES: GBM Ab: 2 units (ref 0–20)

## 2019-10-02 LAB — C3 COMPLEMENT: C3 Complement: 129 mg/dL (ref 82–167)

## 2019-10-02 MED ORDER — SODIUM ZIRCONIUM CYCLOSILICATE 10 G PO PACK
10.0000 g | PACK | Freq: Every day | ORAL | Status: DC
  Administered 2019-10-02 – 2019-10-05 (×3): 10 g via ORAL
  Filled 2019-10-02 (×4): qty 1

## 2019-10-02 NOTE — Progress Notes (Addendum)
PROGRESS NOTE    [name]  OIZ:[id number] DOB: 02-22-1945 DOA: 09/30/2019 PCP: Patient, No Pcp Per    Brief Narrative:   [name]  is a 74 y.o. female, with possible chronic hepatitis C. GERD, H. pylori gastritis. chronic combined systolic and diastolic heart failure, ICD, COPD, CAD, hypertension, obstructive sleep apnea, nonsustained ventricular tachycardia. LVEF 50% August 2019, grade 1 diastolic dysfunction and mild aortic stenosis. -Patient presents to ED today secondary to neck and back pain after falls, patient reports has been having unsteady gait, weakness and fatigue for last few days, patient was recently seen at [facility] for hematuria, her CT abdomen pelvis done with no acute findings, her creatinine was at 1.24, her UA did show some proteinuria and large bloods, she was treated with ciprofloxacin, urine cultures were not sent, patient presents to ED secondary to worsening fatigue, weakness, unsteady gait, she denies any chest pain, nausea or vomiting, denies any dysuria, but reports hematuria has resolved.  As well patiently was started to be seen recently by GI for diagnosis of chronic hepatitis C. - in ED her work-up was significant for creatinine of 2.6, potassium of 6.1, EKG showing prolonged QTC at 560, CT abdomen pelvis done showing no obstructive uropathy, no stones, no acute findings, currently her UA remains pending, LFTs elevated including AST of 132, total bili of 1.4, ALT within normal limit, [facility] hospitalist consulted to admit.   Assessment & Plan:   Active Problems:   HLD (hyperlipidemia)   HTN (hypertension)   VENTRICULAR TACHYCARDIA   Implantable cardioverter-defibrillator (ICD) in situ   Abnormal thyroid function test   Cardiomyopathy, ischemic   Chronic hepatitis C (HCC)   GERD (gastroesophageal reflux disease)   AKI (acute kidney injury) (HCC)   1)Acute kidney injury--- suspect component of dehydration, rhabdomyolysis compounded by  ACE inhibitor use ---  Creatinine has risen from 1.24 on 8/12-8.64 on 8/23.  --Creatinine currently 7.5  her urinalysis shows blood and proteinuria.  She was taking Cipro prior to admission.   AIN less likely given lack of improvement after discontinuation of Cipro -Glomerulonephritis appears more likely, serologies pending, plan is to go to [facility] on 10/03/2019 for kidney biopsy -Continue IV fluids, discussed with nephrology service  2)Hyperkalemia.  Secondary to acute kidney injury--- potassium improved currently 5.2, will give Lokelma  3)Rhabdomyolysis.  Mild elevation of CK secondary to immobilization on the floor, poor CK clearance due to AKI-- 2,469 >>3,290.   Continue on IV fluids and monitor urine output  3)Hypertension.  Chronically on ACE inhibitor which is been discontinued.  4)Chronic hep C.  Followed by GI as an outpatient, check LFTs in a.m.  5)Peripheral arterial disease.  Chronically on Xarelto and statin.  These are currently on hold.  6)Nonsustained ventricular tachycardia status post ICD.  Followed by Dr. Johney Frame.  Resume Coreg once blood pressure stabilizes.  7) leukocytosis noted-- urine culture, Blood culture   DVT prophylaxis: SCDs Start: 09/30/19 1918  Code Status: Full code Family Communication: Updated husband over the phone Disposition Plan: Status is: Inpatient  Remains inpatient appropriate because:Inpatient level of care appropriate due to severity of illness   Dispo: The patient is from: Home              Anticipated d/c is to: SNF              Anticipated d/c date is: 3 days              Patient currently is not  medically stable to d/c.  Patient needs continued work-up/treatment of renal failure, need for IV fluids and awaiting renal biopsy   Consultants:   Nephrology  Procedures:   Renal biopsy 10/03/2019  Antimicrobials:       Subjective: Remittent episodes of confusion or disorientation, -Nausea but no vomiting  -Having  difficulties with IV access  Objective: Vitals:   10/02/19 0000 10/02/19 0406 10/02/19 1614 10/02/19 1655  BP:  (!) 108/54 123/76   Pulse: (!) 103 95 (!) 126 (!) 109  Resp:  18 17   Temp:  98.3 F (36.8 C) 98.3 F (36.8 C)   TempSrc:  Oral Oral   SpO2:  100% 91%   Weight:      Height:        Intake/Output Summary (Last 24 hours) at 10/02/2019 1756 Last data filed at 10/02/2019 1400 Gross per 24 hour  Intake 480 ml  Output 600 ml  Net -120 ml   Filed Weights   09/30/19 1118  Weight: 74.8 kg    Examination:  General exam: In no acute distress, Respiratory system: Clear to auscultation. Respiratory effort normal. Cardiovascular system: S1 & S2 heard, RRR.   Gastrointestinal system: Abdomen is nondistended, soft and nontender.  Normal bowel sounds heard. Central nervous system:  No focal neurological deficits. Extremities: Symmetric 5 x 5 power. Skin: No rashes, lesions or ulcers Psychiatry: Occasional episodes of confusion and disorientation    Data Reviewed: I have personally reviewed following labs and imaging studies  CBC: Recent Labs  Lab 09/30/19 1249 10/01/19 0356 10/02/19 0615  WBC 10.7* 11.0* 12.1*  HGB 11.9* 9.9* 10.1*  HCT 40.6 35.0* 35.5*  MCV 101.8* 103.9* 102.0*  PLT 177 171 171   Basic Metabolic Panel: Recent Labs  Lab 09/30/19 1249 09/30/19 1739 09/30/19 2035 10/01/19 0356 10/02/19 0615  NA 134*  --  134* 136 138  K 6.1*  --  5.5* 4.9 5.2*  CL 100  --  104 106 109  CO2 23  --  18* 17* 17*  GLUCOSE 94  --  79 70 107*  BUN 84*  --  85* 81* 78*  CREATININE 8.64*  --  8.47* 8.06* 7.51*  CALCIUM 8.3*  --  7.5* 7.2* 7.6*  PHOS  --  7.7*  --  7.3* 6.4*   GFR: Estimated Creatinine Clearance: 6 mL/min (A) (by C-G formula based on SCr of 7.51 mg/dL (H)). Liver Function Tests: Recent Labs  Lab 09/30/19 1249 10/01/19 0356 10/02/19 0615  AST 132* 103*  --   ALT 39 34  --   ALKPHOS 42 35*  --   BILITOT 1.4* 1.0  --   PROT 7.3 6.0*  --    ALBUMIN 3.5 3.0* 3.3*   Recent Labs  Lab 09/30/19 1249  LIPASE 41   No results for input(s): AMMONIA in the last 168 hours. Coagulation Profile: Recent Labs  Lab 09/30/19 1739 10/01/19 0356  INR 1.2 1.1   Cardiac Enzymes: Recent Labs  Lab 09/30/19 1249 10/01/19 1235  CKTOTAL 2,469* 3,290*   BNP (last 3 results) No results for input(s): PROBNP in the last 8760 hours. HbA1C: No results for input(s): HGBA1C in the last 72 hours. CBG: No results for input(s): GLUCAP in the last 168 hours. Lipid Profile: No results for input(s): CHOL, HDL, LDLCALC, TRIG, CHOLHDL, LDLDIRECT in the last 72 hours. Thyroid Function Tests: No results for input(s): TSH, T4TOTAL, FREET4, T3FREE, THYROIDAB in the last 72 hours. Anemia Panel: No results for input(s):  VITAMINB12, FOLATE, FERRITIN, TIBC, IRON, RETICCTPCT in the last 72 hours. Sepsis Labs: No results for input(s): PROCALCITON, LATICACIDVEN in the last 168 hours.  Recent Results (from the past 240 hour(s))  SARS Coronavirus 2 by RT PCR (hospital order, performed in [facility] hospital lab) Nasopharyngeal Nasopharyngeal Swab     Status: None   Collection Time: 09/30/19  4:28 PM   Specimen: Nasopharyngeal Swab  Result Value Ref Range Status   SARS Coronavirus 2 NEGATIVE NEGATIVE Final    Comment: (NOTE) SARS-CoV-2 target nucleic acids are NOT DETECTED.  The SARS-CoV-2 RNA is generally detectable in upper and lower respiratory specimens during the acute phase of infection. The lowest concentration of SARS-CoV-2 viral copies this assay can detect is 250 copies / mL. A negative result does not preclude SARS-CoV-2 infection and should not be used as the sole basis for treatment or other patient management decisions.  A negative result may occur with improper specimen collection / handling, submission of specimen other than nasopharyngeal swab, presence of viral mutation(s) within the areas targeted by this assay, and inadequate  number of viral copies (<250 copies / mL). A negative result must be combined with clinical observations, patient history, and epidemiological information.  Fact Sheet for Patients:   BoilerBrush.com.cy  Fact Sheet for Healthcare Providers: https://pope.com/  This test is not yet approved or  cleared by the [place] FDA and has been authorized for detection and/or diagnosis of SARS-CoV-2 by FDA under an Emergency Use Authorization (EUA).  This EUA will remain in effect (meaning this test can be used) for the duration of the COVID-19 declaration under Section 564(b)(1) of the Act, 21 U.S.C. section 360bbb-3(b)(1), unless the authorization is terminated or revoked sooner.  Performed at [facility], [address] [place], [place] [address]          Radiology Studies: US RENAL  Result Date: 10/02/2019 CLINICAL DATA:  Inpatient.  Acute renal failure. EXAM: RENAL / URINARY TRACT ULTRASOUND COMPLETE COMPARISON:  09/30/2019 CT abdomen/pelvis. 09/04/2013 abdominal sonogram. FINDINGS: Right Kidney: Renal measurements: 10.2 x 4.3 x 6.1 cm = volume: 139 mL. Mildly echogenic right renal parenchyma, normal thickness. No hydronephrosis. No mass. Left Kidney: Renal measurements: 10.0 x 5.9 x 7.1 cm = volume: 218 mL. Mildly echogenic left renal parenchyma, normal thickness. Mild left hydronephrosis. No mass. Bladder: Appears normal for degree of bladder distention. Other: None. IMPRESSION: 1. Mild left hydronephrosis of uncertain etiology. No right hydronephrosis. Suggest correlation with urinalysis. Consider short-term follow-up evaluation with hematuria protocol CT abdomen/pelvis without and with IV contrast. 2. Echogenic normal size kidneys, indicative of acute nonspecific renal parenchymal disease. 3. Normal bladder. Electronically Signed   By: Delbert Phenix M.D.   On: 10/02/2019 12:44        Scheduled Meds: . pantoprazole  40 mg Oral Daily   . sodium zirconium cyclosilicate  10 g Oral Daily   Continuous Infusions: . sodium chloride 125 mL/hr at 10/01/19 0234     LOS: 2 days    Shon Hale, MD [facility]   If 7PM-7AM, please contact night-coverage [email or web address]  10/02/2019, 5:56 PM

## 2019-10-02 NOTE — Progress Notes (Signed)
Patient ID: [name], female   DOB: May 22, 1945, 74 y.o.   MRN: [id number] S: Feels a little shaky this am but no new complaints O:BP (!) 108/54 (BP Location: Right Arm)   Pulse 95   Temp 98.3 F (36.8 C) (Oral)   Resp 18   Ht 5' (1.524 m)   Wt 74.8 kg   SpO2 100%   BMI 32.22 kg/m   Intake/Output Summary (Last 24 hours) at 10/02/2019 1040 Last data filed at 10/01/2019 1800 Gross per 24 hour  Intake 875 ml  Output 300 ml  Net 575 ml   Intake/Output: I/O last 3 completed shifts: In: 1125 [I.V.:1125] Out: 300 [Urine:300]  Intake/Output this shift:  No intake/output data recorded. Weight change:  Gen: NAD CVS: no rub Resp: cta Abd: benign Ext: trace edema  Recent Labs  Lab 09/30/19 1249 09/30/19 1739 09/30/19 2035 10/01/19 0356 10/02/19 0615  NA 134*  --  134* 136 138  K 6.1*  --  5.5* 4.9 5.2*  CL 100  --  104 106 109  CO2 23  --  18* 17* 17*  GLUCOSE 94  --  79 70 107*  BUN 84*  --  85* 81* 78*  CREATININE 8.64*  --  8.47* 8.06* 7.51*  ALBUMIN 3.5  --   --  3.0* 3.3*  CALCIUM 8.3*  --  7.5* 7.2* 7.6*  PHOS  --  7.7*  --  7.3* 6.4*  AST 132*  --   --  103*  --   ALT 39  --   --  34  --    Liver Function Tests: Recent Labs  Lab 09/30/19 1249 10/01/19 0356 10/02/19 0615  AST 132* 103*  --   ALT 39 34  --   ALKPHOS 42 35*  --   BILITOT 1.4* 1.0  --   PROT 7.3 6.0*  --   ALBUMIN 3.5 3.0* 3.3*   Recent Labs  Lab 09/30/19 1249  LIPASE 41   No results for input(s): AMMONIA in the last 168 hours. CBC: Recent Labs  Lab 09/30/19 1249 10/01/19 0356 10/02/19 0615  WBC 10.7* 11.0* 12.1*  HGB 11.9* 9.9* 10.1*  HCT 40.6 35.0* 35.5*  MCV 101.8* 103.9* 102.0*  PLT 177 171 171   Cardiac Enzymes: Recent Labs  Lab 09/30/19 1249 10/01/19 1235  CKTOTAL 2,469* 3,290*   CBG: No results for input(s): GLUCAP in the last 168 hours.  Iron Studies: No results for input(s): IRON, TIBC, TRANSFERRIN, FERRITIN in the last 72 hours. Studies/Results: CT  ABDOMEN PELVIS WO CONTRAST  Result Date: 09/30/2019 CLINICAL DATA:  Fall from bed 3 days ago with persistent right-sided pain, initial encounter EXAM: CT CHEST, ABDOMEN AND PELVIS WITHOUT CONTRAST TECHNIQUE: Multidetector CT imaging of the chest, abdomen and pelvis was performed following the standard protocol without IV contrast. COMPARISON:  None. FINDINGS: CT CHEST FINDINGS Cardiovascular: Atherosclerotic calcifications of the thoracic aorta are noted. Coronary calcifications are seen. No cardiac enlargement is noted. Pacing device is seen. The pulmonary artery shows normal caliber. Mediastinum/Nodes: Thoracic inlet is within normal limits. No hilar or mediastinal adenopathy is noted. The esophagus as visualized is within normal limits. Lungs/Pleura: The lungs are well aerated bilaterally. Minimal right lower lobe atelectatic changes are noted. No sizable effusion or pneumothorax is seen. Musculoskeletal: No acute compression deformity is noted. Postsurgical changes in the left humerus are seen. No acute rib abnormality is noted. CT ABDOMEN PELVIS FINDINGS Hepatobiliary: No focal liver abnormality is seen. No gallstones,  gallbladder wall thickening, or biliary dilatation. Pancreas: Unremarkable. No pancreatic ductal dilatation or surrounding inflammatory changes. Spleen: Normal in size without focal abnormality. Adrenals/Urinary Tract: Adrenal glands are unremarkable. Kidneys are well visualized bilaterally. No renal calculi are seen. No obstructive changes are seen. The bladder is decompressed. Stomach/Bowel: The appendix has been surgically removed. No obstructive or inflammatory changes of the colon or small bowel are seen. The stomach is within normal limits. Vascular/Lymphatic: Aortic atherosclerosis. No enlarged abdominal or pelvic lymph nodes. Reproductive: Status post hysterectomy. No adnexal masses. Other: No abdominal wall hernia or abnormality. No abdominopelvic ascites. Musculoskeletal: Degenerative  changes of lumbar spine are noted. IMPRESSION: CT of the chest: Mild right lower lobe atelectatic changes. No pneumothorax or effusion is seen. No acute abnormality is noted. CT of the abdomen and pelvis: No acute abnormality seen. Aortic Atherosclerosis (ICD10-I70.0). Electronically Signed   By: Alcide Clever M.D.   On: 09/30/2019 16:08   CT Head Wo Contrast  Result Date: 09/30/2019 CLINICAL DATA:  Fall out of bed.  Fell 3 days ago. EXAM: CT HEAD WITHOUT CONTRAST TECHNIQUE: Contiguous axial images were obtained from the base of the skull through the vertex without intravenous contrast. COMPARISON:  04/30/2014 FINDINGS: Brain: Mild cerebral atrophy. Mild-to-moderate low density in the periventricular white matter likely related to small vessel disease. No mass lesion, hemorrhage, hydrocephalus, acute infarct, intra-axial, or extra-axial fluid collection. Vascular: No hyperdense vessel or unexpected calcification. Skull: No significant soft tissue swelling.  No skull fracture. Sinuses/Orbits: Normal imaged portions of the orbits and globes. Clear paranasal sinuses and mastoid air cells. Hypoplastic frontal sinuses. Cerumen in the external ear canals. Other: None. IMPRESSION: 1. No acute intracranial abnormality. 2. Cerebral atrophy and small vessel ischemic change. Electronically Signed   By: Jeronimo Greaves M.D.   On: 09/30/2019 16:11   CT Chest Wo Contrast  Result Date: 09/30/2019 CLINICAL DATA:  Fall from bed 3 days ago with persistent right-sided pain, initial encounter EXAM: CT CHEST, ABDOMEN AND PELVIS WITHOUT CONTRAST TECHNIQUE: Multidetector CT imaging of the chest, abdomen and pelvis was performed following the standard protocol without IV contrast. COMPARISON:  None. FINDINGS: CT CHEST FINDINGS Cardiovascular: Atherosclerotic calcifications of the thoracic aorta are noted. Coronary calcifications are seen. No cardiac enlargement is noted. Pacing device is seen. The pulmonary artery shows normal caliber.  Mediastinum/Nodes: Thoracic inlet is within normal limits. No hilar or mediastinal adenopathy is noted. The esophagus as visualized is within normal limits. Lungs/Pleura: The lungs are well aerated bilaterally. Minimal right lower lobe atelectatic changes are noted. No sizable effusion or pneumothorax is seen. Musculoskeletal: No acute compression deformity is noted. Postsurgical changes in the left humerus are seen. No acute rib abnormality is noted. CT ABDOMEN PELVIS FINDINGS Hepatobiliary: No focal liver abnormality is seen. No gallstones, gallbladder wall thickening, or biliary dilatation. Pancreas: Unremarkable. No pancreatic ductal dilatation or surrounding inflammatory changes. Spleen: Normal in size without focal abnormality. Adrenals/Urinary Tract: Adrenal glands are unremarkable. Kidneys are well visualized bilaterally. No renal calculi are seen. No obstructive changes are seen. The bladder is decompressed. Stomach/Bowel: The appendix has been surgically removed. No obstructive or inflammatory changes of the colon or small bowel are seen. The stomach is within normal limits. Vascular/Lymphatic: Aortic atherosclerosis. No enlarged abdominal or pelvic lymph nodes. Reproductive: Status post hysterectomy. No adnexal masses. Other: No abdominal wall hernia or abnormality. No abdominopelvic ascites. Musculoskeletal: Degenerative changes of lumbar spine are noted. IMPRESSION: CT of the chest: Mild right lower lobe atelectatic changes. No pneumothorax or effusion  is seen. No acute abnormality is noted. CT of the abdomen and pelvis: No acute abnormality seen. Aortic Atherosclerosis (ICD10-I70.0). Electronically Signed   By: Alcide Clever M.D.   On: 09/30/2019 16:08   . pantoprazole  40 mg Oral Daily    BMET    Component Value Date/Time   NA 138 10/02/2019 0615   K 5.2 (H) 10/02/2019 0615   CL 109 10/02/2019 0615   CO2 17 (L) 10/02/2019 0615   GLUCOSE 107 (H) 10/02/2019 0615   BUN 78 (H) 10/02/2019 0615    CREATININE 7.51 (H) 10/02/2019 0615   CREATININE 1.02 (H) 09/11/2017 0823   CALCIUM 7.6 (L) 10/02/2019 0615   GFRNONAA 5 (L) 10/02/2019 0615   GFRAA 6 (L) 10/02/2019 0615   CBC    Component Value Date/Time   WBC 12.1 (H) 10/02/2019 0615   RBC 3.48 (L) 10/02/2019 0615   HGB 10.1 (L) 10/02/2019 0615   HCT 35.5 (L) 10/02/2019 0615   PLT 171 10/02/2019 0615   MCV 102.0 (H) 10/02/2019 0615   MCH 29.0 10/02/2019 0615   MCHC 28.5 (L) 10/02/2019 0615   RDW 13.5 10/02/2019 0615   LYMPHSABS 2.6 04/30/2014 1317   MONOABS 0.6 04/30/2014 1317   EOSABS 0.2 04/30/2014 1317   BASOSABS 0.0 04/30/2014 1317     Assessment/Plan:  1. AKI- Scr has risen from 1.24 on 09/19/19 to 8.64 on 09/30/19.  UA + for blood and proteinuria.  Treated with cipro 2 weeks ago so AIN possible, however she is not longer taking the antibiotic.  Need to rule out acute GN and serologies pending.   1. ANA, ASO, HIV, dsDNA, RF all negative 2. Normal complements 3. CK rising from 2500 to 3200 despite IVF's, but not enough to account for severe decline in renal function in 2 weeks. 4. Mildly elevated kappa/lamda light chains will order SPEP and UPEP 5. ANCA pending  6. She will need a renal biopsy at [facility] and have consulted IR  7. No indication for dialysis at this time as renal function slowly improving with IVF's.  2. Hyperkalemia- due to #1 will dose with lokelma 3. HTN- off ace-inhibitor 4. Falls/deconditioning- will likely require SNF placement after discharge 5. COPD 6. HCV, chronic  7. Anemia- of acute illness 8. Elevated CPK- mild rhabdo- follow levels  Irena Cords, MD [employer] [phone]

## 2019-10-02 NOTE — Progress Notes (Signed)
Patient c/o not being able to void.  received order for I&O cath after bladder scan showed over 500cc of urine.  Patient did get up and void over 700cc.Marland Kitchen No I&O cath done at this time.

## 2019-10-02 NOTE — Progress Notes (Signed)
Midline order received. Secure chat with SWOT RN, AC to try PIV.

## 2019-10-02 NOTE — Progress Notes (Signed)
IV came out by patient on accident. SWOT nurse attempted multiple times by ultrasound to get IV access but was unsuccessful. Dr. Marisa Severin paged to see if midline or PICC would be acceptable option. Will monitor situation.

## 2019-10-02 NOTE — Progress Notes (Signed)
Physical Therapy Treatment Patient Details Name: [name] MRN: [id number] DOB: August 04, 1945 Today's Date: 10/02/2019    History of Present Illness 74 y.o. female with PMH significant for hepatitis C, CHF, ICD, COPD, CAD, HTN, obstructive sleep apnea, nonsustained ventricular tachycardia, presents with c/o neck/back pain after fall and weakness.    PT Comments    Patient demonstrates increased endurance/distance for taking steps with unsteady labored cadence, occasional buckling of knees with jerky like movements, limited secondary to fatigue and tolerated sitting up in chair with her daughter present after therapy.  Patient will benefit from continued physical therapy in hospital and recommended venue below to increase strength, balance, endurance for safe ADLs and gait.    Follow Up Recommendations  SNF;Supervision for mobility/OOB;Supervision - Intermittent     Equipment Recommendations  None recommended by PT    Recommendations for Other Services       Precautions / Restrictions      Mobility  Bed Mobility Overal bed mobility: Needs Assistance Bed Mobility: Supine to Sit;Sit to Supine     Supine to sit: Supervision;Min guard Sit to supine: Supervision;Min guard   General bed mobility comments: increased time, labored movement  Transfers Overall transfer level: Needs assistance Equipment used: Rolling walker (2 wheeled);1 person hand held assist Transfers: Sit to/from [employer] Sit to Stand: Min guard Stand pivot transfers: Min guard;Min assist       General transfer comment: labored movement, occasional buckling of knees  Ambulation/Gait Ambulation/Gait assistance: Mod assist;Min assist Gait Distance (Feet): 25 Feet Assistive device: Rolling walker (2 wheeled) Gait Pattern/deviations: Decreased step length - right;Decreased step length - left;WFL(Within Functional Limits) Gait velocity: decreased   General Gait Details: slow labored  cadence with frequently shaking of body with knees buckling, limited secondary to fatigue and fair/poor standing balance   Stairs             Wheelchair Mobility    Modified Rankin (Stroke Patients Only)       Balance Overall balance assessment: Needs assistance;History of Falls Sitting-balance support: Feet supported;No upper extremity supported Sitting balance-Leahy Scale: Fair Sitting balance - Comments: seated at EOB   Standing balance support: During functional activity;No upper extremity supported Standing balance-Leahy Scale: Poor Standing balance comment: fair/poor without AD, fair using RW                            Cognition Arousal/Alertness: Awake/alert Behavior During Therapy: WFL for tasks assessed/performed Overall Cognitive Status: Within Functional Limits for tasks assessed                                        Exercises General Exercises - Lower Extremity Ankle Circles/Pumps: Seated;Strengthening;10 reps;AROM Long Arc Quad: Seated;AROM;Strengthening;10 reps Hip Flexion/Marching: Seated;AROM;Strengthening;10 reps    General Comments        Pertinent Vitals/Pain Pain Assessment: No/denies pain    Home Living                      Prior Function            PT Goals (current goals can now be found in the care plan section) Acute Rehab PT Goals Patient Stated Goal: "get her stronger" PT Goal Formulation: With family Time For Goal Achievement: 10/15/19 Potential to Achieve Goals: Good Progress towards PT goals: Progressing toward goals  Frequency    Min 3X/week      PT Plan Current plan remains appropriate    Co-evaluation              AM-PAC PT "6 Clicks" Mobility   Outcome Measure  Help needed turning from your back to your side while in a flat bed without using bedrails?: None Help needed moving from lying on your back to sitting on the side of a flat bed without using bedrails?:  A Little Help needed moving to and from a bed to a chair (including a wheelchair)?: A Little Help needed standing up from a chair using your arms (e.g., wheelchair or bedside chair)?: A Little Help needed to walk in hospital room?: A Lot Help needed climbing 3-5 steps with a railing? : A Lot 6 Click Score: 17    End of Session   Activity Tolerance: Patient tolerated treatment well;Patient limited by fatigue Patient left: in chair;with call bell/phone within reach;with family/visitor present Nurse Communication: Mobility status PT Visit Diagnosis: Unsteadiness on feet (R26.81);Other abnormalities of gait and mobility (R26.89);Repeated falls (R29.6);Muscle weakness (generalized) (M62.81)     Time: 1120-1150 PT Time Calculation (min) (ACUTE ONLY): 30 min  Charges:  $Therapeutic Exercise: 8-22 mins $Therapeutic Activity: 8-22 mins                     2:00 PM, 10/02/19 Ocie Bob, MPT Physical Therapist with [facility] [phone] office [phone] mobile phone

## 2019-10-02 NOTE — Progress Notes (Signed)
OT Cancellation Note  Patient Details Name: [name] MRN: [id number] DOB: May 24, 1945   Cancelled Treatment:    Reason Eval/Treat Not Completed: Fatigue/lethargy limiting ability to participate. Pt sleeping upon therapy arrival. Able to wake up momentarily when therapist introduced self. Pt immediately fell back to sleep and therapist could not engage patient or awaken her. Will re-attempt OT evaluation when able to participate at a later time.    Limmie Patricia, OTR/L,CBIS  [phone] 10/02/2019, 9:20 AM
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
## 2019-10-02 NOTE — TOC Progression Note (Signed)
Transition of Care [facility]) - Progression Note    Patient Details  Name: [name] MRN: [id number] Date of Birth: Jul 11, 1945  [facility]) CM/SW Contact  Karn Cassis, [place] Phone Number: 10/02/2019, 12:56 PM  Clinical Narrative:  LCSW discussed home health needs with pt's husband who is agreeable to referral to [facility]. MD notified for home health orders and TOC will send referral to [facility].     Expected Discharge Plan: Home w Home Health Services Barriers to Discharge: Continued Medical Work up  Expected Discharge Plan and Services Expected Discharge Plan: Home w Home Health Services       Living arrangements for the past 2 months: Single Family Home                                       Social Determinants of Health (SDOH) Interventions    Readmission Risk Interventions No flowsheet data found.

## 2019-10-03 ENCOUNTER — Inpatient Hospital Stay (HOSPITAL_COMMUNITY): Payer: 59

## 2019-10-03 ENCOUNTER — Ambulatory Visit (HOSPITAL_COMMUNITY)
Admit: 2019-10-03 | Discharge: 2019-10-03 | Disposition: A | Discharge status: Home / Self Care | Payer: 59 | Attending: Nephrology | Admitting: Nephrology

## 2019-10-03 HISTORY — PX: IR US GUIDE VASC ACCESS RIGHT: IMG2390

## 2019-10-03 HISTORY — PX: IR FLUORO GUIDE CV LINE RIGHT: IMG2283

## 2019-10-03 LAB — MAGNESIUM: Magnesium: 1.8 mg/dL (ref 1.7–2.4)

## 2019-10-03 LAB — CBC WITH DIFFERENTIAL/PLATELET
Abs Immature Granulocytes: 0.06 10*3/uL (ref 0.00–0.07)
Basophils Absolute: 0 10*3/uL (ref 0.0–0.1)
Basophils Relative: 0 %
Eosinophils Absolute: 0 10*3/uL (ref 0.0–0.5)
Eosinophils Relative: 0 %
HCT: 32.8 % — ABNORMAL LOW (ref 36.0–46.0)
Hemoglobin: 9.5 g/dL — ABNORMAL LOW (ref 12.0–15.0)
Immature Granulocytes: 1 %
Lymphocytes Relative: 15 %
Lymphs Abs: 1.8 10*3/uL (ref 0.7–4.0)
MCH: 29.5 pg (ref 26.0–34.0)
MCHC: 29 g/dL — ABNORMAL LOW (ref 30.0–36.0)
MCV: 101.9 fL — ABNORMAL HIGH (ref 80.0–100.0)
Monocytes Absolute: 0.7 10*3/uL (ref 0.1–1.0)
Monocytes Relative: 6 %
Neutro Abs: 9.5 10*3/uL — ABNORMAL HIGH (ref 1.7–7.7)
Neutrophils Relative %: 78 %
Platelets: 153 10*3/uL (ref 150–400)
RBC: 3.22 MIL/uL — ABNORMAL LOW (ref 3.87–5.11)
RDW: 13.4 % (ref 11.5–15.5)
WBC: 12.1 10*3/uL — ABNORMAL HIGH (ref 4.0–10.5)
nRBC: 0 % (ref 0.0–0.2)

## 2019-10-03 LAB — COMPREHENSIVE METABOLIC PANEL
ALT: 41 U/L (ref 0–44)
AST: 92 U/L — ABNORMAL HIGH (ref 15–41)
Albumin: 3.1 g/dL — ABNORMAL LOW (ref 3.5–5.0)
Alkaline Phosphatase: 32 U/L — ABNORMAL LOW (ref 38–126)
Anion gap: 9 (ref 5–15)
BUN: 76 mg/dL — ABNORMAL HIGH (ref 8–23)
CO2: 15 mmol/L — ABNORMAL LOW (ref 22–32)
Calcium: 7.9 mg/dL — ABNORMAL LOW (ref 8.9–10.3)
Chloride: 113 mmol/L — ABNORMAL HIGH (ref 98–111)
Creatinine, Ser: 6.21 mg/dL — ABNORMAL HIGH (ref 0.44–1.00)
GFR calc Af Amer: 7 mL/min — ABNORMAL LOW (ref >60)
GFR calc non Af Amer: 6 mL/min — ABNORMAL LOW (ref >60)
Glucose, Bld: 90 mg/dL (ref 70–99)
Potassium: 4.6 mmol/L (ref 3.5–5.1)
Sodium: 137 mmol/L (ref 135–145)
Total Bilirubin: 1.1 mg/dL (ref 0.3–1.2)
Total Protein: 6.4 g/dL — ABNORMAL LOW (ref 6.5–8.1)

## 2019-10-03 LAB — ANCA TITERS
Atypical P-ANCA titer: <1:20 {titer}
C-ANCA: <1:20 {titer}
P-ANCA: <1:20 {titer}

## 2019-10-03 LAB — CK: Total CK: 1038 U/L — ABNORMAL HIGH (ref 38–234)

## 2019-10-03 MED ORDER — MIDAZOLAM HCL 2 MG/2ML IJ SOLN
INTRAMUSCULAR | Status: AC
  Filled 2019-10-03: qty 4

## 2019-10-03 MED ORDER — OXYCODONE HCL 5 MG PO TABS
5.0000 mg | ORAL_TABLET | Freq: Once | ORAL | Status: AC
  Administered 2019-10-03: 5 mg via ORAL
  Filled 2019-10-03: qty 1

## 2019-10-03 MED ORDER — FENTANYL CITRATE (PF) 100 MCG/2ML IJ SOLN
INTRAMUSCULAR | Status: AC | PRN
  Administered 2019-10-03: 25 ug via INTRAVENOUS

## 2019-10-03 MED ORDER — METOPROLOL TARTRATE 50 MG PO TABS
25.0000 mg | ORAL_TABLET | Freq: Once | ORAL | Status: AC
  Administered 2019-10-03: 25 mg via ORAL
  Filled 2019-10-03: qty 1

## 2019-10-03 MED ORDER — SODIUM CHLORIDE 0.9 % IV SOLN: INTRAVENOUS | Status: DC

## 2019-10-03 MED ORDER — LIDOCAINE HCL 1 % IJ SOLN
INTRAMUSCULAR | Status: AC
  Filled 2019-10-03: qty 40

## 2019-10-03 MED ORDER — FENTANYL CITRATE (PF) 100 MCG/2ML IJ SOLN
INTRAMUSCULAR | Status: AC
  Filled 2019-10-03: qty 2

## 2019-10-03 MED ORDER — GELATIN ABSORBABLE 12-7 MM EX MISC
CUTANEOUS | Status: AC
  Filled 2019-10-03: qty 1

## 2019-10-03 MED ORDER — MIDAZOLAM HCL 2 MG/2ML IJ SOLN
INTRAMUSCULAR | Status: AC | PRN
  Administered 2019-10-03 (×2): 0.5 mg via INTRAVENOUS

## 2019-10-03 MED ORDER — FENTANYL CITRATE (PF) 100 MCG/2ML IJ SOLN
INTRAMUSCULAR | Status: AC | PRN
  Administered 2019-10-03 (×2): 25 ug via INTRAVENOUS

## 2019-10-03 MED ORDER — LIDOCAINE HCL (PF) 1 % IJ SOLN
INTRAMUSCULAR | Status: AC | PRN
  Administered 2019-10-03: 10 mL

## 2019-10-03 MED ORDER — MIDAZOLAM HCL 2 MG/2ML IJ SOLN
INTRAMUSCULAR | Status: AC | PRN
  Administered 2019-10-03: 0.5 mg via INTRAVENOUS

## 2019-10-03 MED ORDER — LIDOCAINE HCL (PF) 1 % IJ SOLN
INTRAMUSCULAR | Status: DC | PRN
  Administered 2019-10-03: 10 mL

## 2019-10-03 NOTE — Progress Notes (Signed)
Midlevel notified of increased heart rate. New orders for labs in place.

## 2019-10-03 NOTE — Sedation Documentation (Signed)
Patient is resting comfortably. 

## 2019-10-03 NOTE — Progress Notes (Signed)
PROGRESS NOTE    [name]  ZOX:[id number] DOB: [birth year] DOA: 09/30/2019 PCP: Patient, No Pcp Per    Brief Narrative:   [name]  is a 74 y.o. female, with possible chronic hepatitis C. GERD, H. pylori gastritis. chronic combined systolic and diastolic heart failure, ICD, COPD, CAD, hypertension, obstructive sleep apnea, nonsustained ventricular tachycardia. LVEF 50% August 2019, grade 1 diastolic dysfunction and mild aortic stenosis. -Patient presents to ED today secondary to neck and back pain after falls, patient reports has been having unsteady gait, weakness and fatigue for last few days, patient was recently seen at [facility] for hematuria, her CT abdomen pelvis done with no acute findings, her creatinine was at 1.24, her UA did show some proteinuria and large bloods, she was treated with ciprofloxacin, urine cultures were not sent, patient presents to ED secondary to worsening fatigue, weakness, unsteady gait, she denies any chest pain, nausea or vomiting, denies any dysuria, but reports hematuria has resolved.  As well patiently was started to be seen recently by GI for diagnosis of chronic hepatitis C. - in ED her work-up was significant for creatinine of 2.6, potassium of 6.1, EKG showing prolonged QTC at 560, CT abdomen pelvis done showing no obstructive uropathy, no stones, no acute findings, currently her UA remains pending, LFTs elevated including AST of 132, total bili of 1.4, ALT within normal limit, [facility] consulted to admit.   Assessment & Plan:   Active Problems:   HLD (hyperlipidemia)   HTN (hypertension)   VENTRICULAR TACHYCARDIA   Implantable cardioverter-defibrillator (ICD) in situ   Abnormal thyroid function test   Cardiomyopathy, ischemic   Chronic hepatitis C (HCC)   GERD (gastroesophageal reflux disease)   AKI (acute kidney injury) (HCC)   1)Acute kidney injury--- suspect component of dehydration, rhabdomyolysis compounded by  ACE inhibitor use ---  Creatinine has risen from 1.24 on 8/12-8.64 on 8/23.  --Creatinine currently 6.21  her urinalysis shows blood and proteinuria.  She was taking Cipro prior to admission.   AIN less likely given lack of improvement after discontinuation of Cipro -Glomerulonephritis appears more likely, serologies pending, S/p kidney biopsy on 10/03/2019 -Continue IV fluids, discussed with nephrology service  2)Hyperkalemia.  Secondary to acute kidney injury--- potassium improved currently 4.6, after Lokelma  3)Rhabdomyolysis.  Mild elevation of CK secondary to immobilization on the floor, poor CK clearance due to AKI-- 2,469 >>3,290>>>1,038  --Overall improving with IV fluids  Continue on IV fluids and monitor urine output  3)Hypertension.  Chronically on ACE inhibitor which is been discontinued.  4)Chronic Hep C---- Followed by GI as an outpatient,  =-AST is down to 92 from 132, ALT 41  T.bili 1.1  5)Peripheral arterial disease.  Chronically on Xarelto and statin.  These are currently on hold.  6)Nonsustained ventricular tachycardia status post ICD.  Followed by Dr. Johney Frame.  Resume Coreg once blood pressure stabilizes.  7)Leukocytosis noted-- urine culture, Blood culture  8) acute anemia--hemoglobin down to 9.5 from 11.9 on admission, suspect related to  hemodilution from IV fluids   DVT prophylaxis: SCDs Start: 09/30/19 1918  Code Status: Full code Family Communication: Updated husband over the phone Disposition Plan: Status is: Inpatient  Remains inpatient appropriate because:Inpatient level of care appropriate due to severity of illness   Dispo: The patient is from: Home              Anticipated d/c is to: SNF  Anticipated d/c date is: 3 days              Patient currently is not medically stable to d/c.  Patient needs continued work-up/treatment of renal failure, need for IV fluids and awaiting renal biopsy   Consultants:   Nephrology  Procedures:     Renal biopsy 10/03/2019 Right IJ central line 10/03/2019 Antimicrobials:       Subjective: -Husband at bedside, questions answered, patient a bit more coherent at this time  Objective: Vitals:   10/03/19 0458 10/03/19 0630 10/03/19 0806 10/03/19 1436  BP: (!) 126/54  138/67 (!) 132/102  Pulse: (!) 120 (!) 114 (!) 112 (!) 117  Resp: 20   16  Temp: 98.3 F (36.8 C)  98 F (36.7 C) 98.1 F (36.7 C)  TempSrc: Oral  Oral Oral  SpO2: (!) 66% 92% 92% 100%  Weight:      Height:        Intake/Output Summary (Last 24 hours) at 10/03/2019 1622 Last data filed at 10/03/2019 0000 Gross per 24 hour  Intake --  Output 750 ml  Net -750 ml   Filed Weights   09/30/19 1118  Weight: 74.8 kg    Examination:  General exam: In no acute distress, Neck-right IJ central line Respiratory system: Clear to auscultation. Respiratory effort normal. Cardiovascular system: S1 & S2 heard, RRR.   Gastrointestinal system: Abdomen is nondistended, soft and nontender.  Normal bowel sounds heard. Central nervous system: Generalized weakness, no focal neurological deficits. Extremities: Symmetric 5 x 5 power. Skin: No rashes, lesions or ulcers Psychiatry: Occasional episodes of confusion and disorientation    Data Reviewed: I have personally reviewed following labs and imaging studies  CBC: Recent Labs  Lab 09/30/19 1249 10/01/19 0356 10/02/19 0615 10/03/19 0724  WBC 10.7* 11.0* 12.1* 12.1*  NEUTROABS  --   --   --  9.5*  HGB 11.9* 9.9* 10.1* 9.5*  HCT 40.6 35.0* 35.5* 32.8*  MCV 101.8* 103.9* 102.0* 101.9*  PLT 177 171 171 153   Basic Metabolic Panel: Recent Labs  Lab 09/30/19 1249 09/30/19 1739 09/30/19 2035 10/01/19 0356 10/02/19 0615 10/03/19 0724  NA 134*  --  134* 136 138 137  K 6.1*  --  5.5* 4.9 5.2* 4.6  CL 100  --  104 106 109 113*  CO2 23  --  18* 17* 17* 15*  GLUCOSE 94  --  79 70 107* 90  BUN 84*  --  85* 81* 78* 76*  CREATININE 8.64*  --  8.47* 8.06* 7.51*  6.21*  CALCIUM 8.3*  --  7.5* 7.2* 7.6* 7.9*  MG  --   --   --   --   --  1.8  PHOS  --  7.7*  --  7.3* 6.4*  --    GFR: Estimated Creatinine Clearance: 7.3 mL/min (A) (by C-G formula based on SCr of 6.21 mg/dL (H)). Liver Function Tests: Recent Labs  Lab 09/30/19 1249 10/01/19 0356 10/02/19 0615 10/03/19 0724  AST 132* 103*  --  92*  ALT 39 34  --  41  ALKPHOS 42 35*  --  32*  BILITOT 1.4* 1.0  --  1.1  PROT 7.3 6.0*  --  6.4*  ALBUMIN 3.5 3.0* 3.3* 3.1*   Recent Labs  Lab 09/30/19 1249  LIPASE 41   No results for input(s): AMMONIA in the last 168 hours. Coagulation Profile: Recent Labs  Lab 09/30/19 1739 10/01/19 0356  INR 1.2 1.1  Cardiac Enzymes: Recent Labs  Lab 09/30/19 1249 10/01/19 1235 10/03/19 0724  CKTOTAL 2,469* 3,290* 1,038*   BNP (last 3 results) No results for input(s): PROBNP in the last 8760 hours. HbA1C: No results for input(s): HGBA1C in the last 72 hours. CBG: No results for input(s): GLUCAP in the last 168 hours. Lipid Profile: No results for input(s): CHOL, HDL, LDLCALC, TRIG, CHOLHDL, LDLDIRECT in the last 72 hours. Thyroid Function Tests: No results for input(s): TSH, T4TOTAL, FREET4, T3FREE, THYROIDAB in the last 72 hours. Anemia Panel: No results for input(s): VITAMINB12, FOLATE, FERRITIN, TIBC, IRON, RETICCTPCT in the last 72 hours. Sepsis Labs: No results for input(s): PROCALCITON, LATICACIDVEN in the last 168 hours.  Recent Results (from the past 240 hour(s))  SARS Coronavirus 2 by RT PCR (hospital order, [facility]) Nasopharyngeal Nasopharyngeal Swab     Status: None   Collection Time: 09/30/19  4:28 PM   Specimen: Nasopharyngeal Swab  Result Value Ref Range Status   SARS Coronavirus 2 NEGATIVE NEGATIVE Final    Comment: (NOTE) SARS-CoV-2 target nucleic acids are NOT DETECTED.  The SARS-CoV-2 RNA is generally detectable in upper and lower respiratory specimens during the acute phase of  infection. The lowest concentration of SARS-CoV-2 viral copies this assay can detect is 250 copies / mL. A negative result does not preclude SARS-CoV-2 infection and should not be used as the sole basis for treatment or other patient management decisions.  A negative result may occur with improper specimen collection / handling, submission of specimen other than nasopharyngeal swab, presence of viral mutation(s) within the areas targeted by this assay, and inadequate number of viral copies (<250 copies / mL). A negative result must be combined with clinical observations, patient history, and epidemiological information.  Fact Sheet for Patients:   BoilerBrush.com.cyhttps://www.fda.gov/media/136312/download  Fact Sheet for Healthcare Providers: https://pope.com/https://www.fda.gov/media/136313/download  This test is not yet approved or  cleared by the [place] FDA and has been authorized for detection and/or diagnosis of SARS-CoV-2 by FDA under an Emergency Use Authorization (EUA).  This EUA will remain in effect (meaning this test can be used) for the duration of the COVID-19 declaration under Section 564(b)(1) of the Act, 21 U.S.C. section 360bbb-3(b)(1), unless the authorization is terminated or revoked sooner.  Performed at [facility], [address] [place], [place] [address]   Culture, blood (Routine X 2) w Reflex to ID Panel     Status: None (Preliminary result)   Collection Time: 10/02/19  6:19 PM   Specimen: BLOOD RIGHT WRIST  Result Value Ref Range Status   Specimen Description BLOOD RIGHT WRIST  Final   Special Requests   Final    BOTTLES DRAWN AEROBIC ONLY Blood Culture results may not be optimal due to an inadequate volume of blood received in culture bottles   Culture   Final    NO GROWTH < 12 HOURS Performed at [facility], [address] [place], [place] [address]    Report Status PENDING  Incomplete  Culture, blood (Routine X 2) w Reflex to ID Panel     Status: None (Preliminary result)    Collection Time: 10/02/19  9:08 PM   Specimen: BLOOD LEFT HAND  Result Value Ref Range Status   Specimen Description BLOOD LEFT HAND  Final   Special Requests   Final    BOTTLES DRAWN AEROBIC AND ANAEROBIC Blood Culture adequate volume   Culture   Final    NO GROWTH < 12 HOURS Performed at  [facility] [address], [place], [place] [address]    Report Status PENDING  Incomplete         Radiology Studies: US RENAL  Result Date: 10/02/2019 CLINICAL DATA:  Inpatient.  Acute renal failure. EXAM: RENAL / URINARY TRACT ULTRASOUND COMPLETE COMPARISON:  09/30/2019 CT abdomen/pelvis. 09/04/2013 abdominal sonogram. FINDINGS: Right Kidney: Renal measurements: 10.2 x 4.3 x 6.1 cm = volume: 139 mL. Mildly echogenic right renal parenchyma, normal thickness. No hydronephrosis. No mass. Left Kidney: Renal measurements: 10.0 x 5.9 x 7.1 cm = volume: 218 mL. Mildly echogenic left renal parenchyma, normal thickness. Mild left hydronephrosis. No mass. Bladder: Appears normal for degree of bladder distention. Other: None. IMPRESSION: 1. Mild left hydronephrosis of uncertain etiology. No right hydronephrosis. Suggest correlation with urinalysis. Consider short-term follow-up evaluation with hematuria protocol CT abdomen/pelvis without and with IV contrast. 2. Echogenic normal size kidneys, indicative of acute nonspecific renal parenchymal disease. 3. Normal bladder. Electronically Signed   By: Delbert Phenix M.D.   On: 10/02/2019 12:44   IR Fluoro Guide CV Line Right  Result Date: 10/03/2019 INDICATION: 74 year old female referred for central venous access EXAM: IMAGE GUIDED CENTRAL VENOUS CATHETER MEDICATIONS: None ANESTHESIA/SEDATION: Moderate (conscious) sedation was employed during this procedure. A total of Versed 0.5 mg and Fentanyl 25 mcg was administered intravenously. Moderate Sedation Time: 15 minutes. The patient's level of consciousness and vital signs were monitored continuously by radiology  nursing throughout the procedure under my direct supervision. FLUOROSCOPY TIME:  Fluoroscopy Time: 0 minutes 6 seconds (1 mGy). COMPLICATIONS: None PROCEDURE: Informed written consent was obtained from the patient after a thorough discussion of the procedural risks, benefits and alternatives. All questions were addressed. Maximal Sterile Barrier Technique was utilized including caps, mask, sterile gowns, sterile gloves, sterile drape, hand hygiene and skin antiseptic. A timeout was performed prior to the initiation of the procedure. Patient positioned supine position on fluoroscopy table. The right IJ region was prepped and draped in the usual sterile fashion. 1% lidocaine was used for local anesthesia. Micro puncture was used to access the right internal jugular vein and a microwire was advanced under fluoroscopy. Peel-away sheath was placed on the microwire and microwire was withdrawn measuring the internal length of a double-lumen central venous catheter. Central venous catheter with 2 lumen was ligated at 15 cm. Catheter was passed through the peel-away sheath and the peel-away sheath was removed. Wire was removed. Final image was stored. Catheter was flushed with saline and capped.  Sutured in position. Dermabond was used to seal some venous who is at the right IJ. Under site. Patient tolerated the procedure well and remained hemodynamically stable throughout. No complications were encountered and no significant blood loss. IMPRESSION: Status post image guided placement of right IJ central venous catheter. Signed, Yvone Neu. Reyne Dumas, RPVI [facility] Radiology Electronically Signed   By: Gilmer Mor D.O.   On: 10/03/2019 12:17   IR US Guide Vasc Access Right  Result Date: 10/03/2019 INDICATION: 74 year old female referred for central venous access EXAM: IMAGE GUIDED CENTRAL VENOUS CATHETER MEDICATIONS: None ANESTHESIA/SEDATION: Moderate (conscious)  sedation was employed during this procedure. A total of Versed 0.5 mg and Fentanyl 25 mcg was administered intravenously. Moderate Sedation Time: 15 minutes. The patient's level of consciousness and vital signs were monitored continuously by radiology nursing throughout the procedure under my direct supervision. FLUOROSCOPY TIME:  Fluoroscopy Time: 0 minutes 6 seconds (1 mGy). COMPLICATIONS: None PROCEDURE: Informed written consent was obtained from the  patient after a thorough discussion of the procedural risks, benefits and alternatives. All questions were addressed. Maximal Sterile Barrier Technique was utilized including caps, mask, sterile gowns, sterile gloves, sterile drape, hand hygiene and skin antiseptic. A timeout was performed prior to the initiation of the procedure. Patient positioned supine position on fluoroscopy table. The right IJ region was prepped and draped in the usual sterile fashion. 1% lidocaine was used for local anesthesia. Micro puncture was used to access the right internal jugular vein and a microwire was advanced under fluoroscopy. Peel-away sheath was placed on the microwire and microwire was withdrawn measuring the internal length of a double-lumen central venous catheter. Central venous catheter with 2 lumen was ligated at 15 cm. Catheter was passed through the peel-away sheath and the peel-away sheath was removed. Wire was removed. Final image was stored. Catheter was flushed with saline and capped.  Sutured in position. Dermabond was used to seal some venous who is at the right IJ. Under site. Patient tolerated the procedure well and remained hemodynamically stable throughout. No complications were encountered and no significant blood loss. IMPRESSION: Status post image guided placement of right IJ central venous catheter. Signed, Yvone Neu. Reyne Dumas, RPVI [facility] Radiology Electronically Signed   By: Gilmer Mor D.O.   On:  10/03/2019 12:17   IR US Guide Bx Asp/Drain  Result Date: 10/03/2019 INDICATION: 74 year old female with a history renal failure referred for biopsy EXAM: IMAGE GUIDED MEDICAL RENAL BIOPSY MEDICATIONS: None. ANESTHESIA/SEDATION: Moderate (conscious) sedation was employed during this procedure. A total of Versed 1.5 mg and Fentanyl 75 mcg was administered intravenously. Moderate Sedation Time: 31 minutes. The patient's level of consciousness and vital signs were monitored continuously by radiology nursing throughout the procedure under my direct supervision. FLUOROSCOPY TIME:  Ultrasound COMPLICATIONS: None PROCEDURE: Informed written consent was obtained from the patient after a thorough discussion of the procedural risks, benefits and alternatives. All questions were addressed. Maximal Sterile Barrier Technique was utilized including caps, mask, sterile gowns, sterile gloves, sterile drape, hand hygiene and skin antiseptic. A timeout was performed prior to the initiation of the procedure. Patient was positioned prone position on the gantry table. Images were stored sent to PACs. Once the patient is prepped and draped in the usual sterile fashion, the skin and subcutaneous tissues overlying the right kidney were generously infiltrated 1% lidocaine for local anesthesia. Using ultrasound guidance, a 17 gauge guide needle was advanced into the cortex of the right kidney. Once we confirmed location of the needle tip, 2 separate 18 gauge core biopsy were achieved. Two Gel-Foam pledgets were infused with a small amount of saline. The needle was removed. Final images were stored. The patient tolerated the procedure well and remained hemodynamically stable throughout. No complications were encountered and no significant blood loss encountered. IMPRESSION: Status post image guided right medical renal biopsy. Signed, Yvone Neu. Reyne Dumas, RPVI [facility] Special[facility] Radiology  Electronically Signed   By: Gilmer Mor D.O.   On: 10/03/2019 13:06        Scheduled Meds: . metoprolol tartrate  25 mg Oral Once  . oxyCODONE  5 mg Oral Once  . pantoprazole  40 mg Oral Daily  . sodium zirconium cyclosilicate  10 g Oral Daily   Continuous Infusions: . sodium chloride       LOS: 3 days    Shon Hale, MD [facility]   If 7PM-7AM, please contact night-coverage [email or web address]  10/03/2019, 4:22  PM  

## 2019-10-03 NOTE — Progress Notes (Signed)
Patient ID: [name], female   DOB: 11/26/1945, 74 y.o.   MRN: [id number] S: Pt has gone to [facility] for renal biopsy and was not in her room.  Lost her IV yesterday and IV team unable to place new access.  For IV access today at IR prior to renal biopsy.  Also developed tachycardia last night without IVF's. O:BP 138/67 (BP Location: Right Arm)   Pulse (!) 112   Temp 98 F (36.7 C) (Oral)   Resp 20   Ht 5' (1.524 m)   Wt 74.8 kg   SpO2 92%   BMI 32.22 kg/m   Intake/Output Summary (Last 24 hours) at 10/03/2019 0839 Last data filed at 10/03/2019 0000 Gross per 24 hour  Intake 240 ml  Output 1050 ml  Net -810 ml   Intake/Output: I/O last 3 completed shifts: In: 480 [P.O.:480] Out: 1050 [Urine:1050]  Intake/Output this shift:  No intake/output data recorded. Weight change:   Recent Labs  Lab 09/30/19 1249 09/30/19 1739 09/30/19 2035 10/01/19 0356 10/02/19 0615  NA 134*  --  134* 136 138  K 6.1*  --  5.5* 4.9 5.2*  CL 100  --  104 106 109  CO2 23  --  18* 17* 17*  GLUCOSE 94  --  79 70 107*  BUN 84*  --  85* 81* 78*  CREATININE 8.64*  --  8.47* 8.06* 7.51*  ALBUMIN 3.5  --   --  3.0* 3.3*  CALCIUM 8.3*  --  7.5* 7.2* 7.6*  PHOS  --  7.7*  --  7.3* 6.4*  AST 132*  --   --  103*  --   ALT 39  --   --  34  --    Liver Function Tests: Recent Labs  Lab 09/30/19 1249 10/01/19 0356 10/02/19 0615  AST 132* 103*  --   ALT 39 34  --   ALKPHOS 42 35*  --   BILITOT 1.4* 1.0  --   PROT 7.3 6.0*  --   ALBUMIN 3.5 3.0* 3.3*   Recent Labs  Lab 09/30/19 1249  LIPASE 41   No results for input(s): AMMONIA in the last 168 hours. CBC: Recent Labs  Lab 09/30/19 1249 10/01/19 0356 10/02/19 0615  WBC 10.7* 11.0* 12.1*  HGB 11.9* 9.9* 10.1*  HCT 40.6 35.0* 35.5*  MCV 101.8* 103.9* 102.0*  PLT 177 171 171   Cardiac Enzymes: Recent Labs  Lab 09/30/19 1249 10/01/19 1235  CKTOTAL 2,469* 3,290*   CBG: No results for input(s): GLUCAP in the last 168 hours.  Iron  Studies: No results for input(s): IRON, TIBC, TRANSFERRIN, FERRITIN in the last 72 hours. Studies/Results: US RENAL  Result Date: 10/02/2019 CLINICAL DATA:  Inpatient.  Acute renal failure. EXAM: RENAL / URINARY TRACT ULTRASOUND COMPLETE COMPARISON:  09/30/2019 CT abdomen/pelvis. 09/04/2013 abdominal sonogram. FINDINGS: Right Kidney: Renal measurements: 10.2 x 4.3 x 6.1 cm = volume: 139 mL. Mildly echogenic right renal parenchyma, normal thickness. No hydronephrosis. No mass. Left Kidney: Renal measurements: 10.0 x 5.9 x 7.1 cm = volume: 218 mL. Mildly echogenic left renal parenchyma, normal thickness. Mild left hydronephrosis. No mass. Bladder: Appears normal for degree of bladder distention. Other: None. IMPRESSION: 1. Mild left hydronephrosis of uncertain etiology. No right hydronephrosis. Suggest correlation with urinalysis. Consider short-term follow-up evaluation with hematuria protocol CT abdomen/pelvis without and with IV contrast. 2. Echogenic normal size kidneys, indicative of acute nonspecific renal parenchymal disease. 3. Normal bladder. Electronically Signed   By:  Delbert Phenix M.D.   On: 10/02/2019 12:44   . pantoprazole  40 mg Oral Daily  . sodium zirconium cyclosilicate  10 g Oral Daily    BMET    Component Value Date/Time   NA 138 10/02/2019 0615   K 5.2 (H) 10/02/2019 0615   CL 109 10/02/2019 0615   CO2 17 (L) 10/02/2019 0615   GLUCOSE 107 (H) 10/02/2019 0615   BUN 78 (H) 10/02/2019 0615   CREATININE 7.51 (H) 10/02/2019 0615   CREATININE 1.02 (H) 09/11/2017 0823   CALCIUM 7.6 (L) 10/02/2019 0615   GFRNONAA 5 (L) 10/02/2019 0615   GFRAA 6 (L) 10/02/2019 0615   CBC    Component Value Date/Time   WBC 12.1 (H) 10/02/2019 0615   RBC 3.48 (L) 10/02/2019 0615   HGB 10.1 (L) 10/02/2019 0615   HCT 35.5 (L) 10/02/2019 0615   PLT 171 10/02/2019 0615   MCV 102.0 (H) 10/02/2019 0615   MCH 29.0 10/02/2019 0615   MCHC 28.5 (L) 10/02/2019 0615   RDW 13.5 10/02/2019 0615    LYMPHSABS 2.6 04/30/2014 1317   MONOABS 0.6 04/30/2014 1317   EOSABS 0.2 04/30/2014 1317   BASOSABS 0.0 04/30/2014 1317    Assessment/Plan:  1. AKI- Scr has risen from 1.24 on 09/19/19 to 8.64 on 09/30/19. UA + for blood and proteinuria. Treated with cipro 2 weeks ago so AIN possible, however she is not longer taking the antibiotic. Need to rule out acute GN and serologies pending.  1. covid-19, ANA, ASO, HIV, dsDNA, RF all negative 2. Normal complements 3. CK rising from 2500 to 3200 despite IVF's, but not enough to account for severe decline in renal function in 2 weeks. 4. Mildly elevated kappa/lamda light chains will order SPEP and UPEP 5. ANCA still pending  6. IR consulted for renal biopsy today at [facility]  7. No indication for dialysis at this time as renal function slowly improving with IVF's.  8. Unfortunately labs were not obtained prior to her transfer to [facility].  Need to repeat renal panel and CK levels. 9. Need IV access for IVF's 2. Hyperkalemia- due to #1 will continue lokelma 3. HTN- off ace-inhibitor 4. Nonsustained Vtach- s/p ICD.  Resumed coreg per primary. 5. Falls/deconditioning- will likely require SNF placement after discharge 6. COPD 7. HCV, chronic  8. Anemia- of acute illness 9. Elevated CPK- mild rhabdo- follow levels  Irena Cords, MD [employer] [phone]

## 2019-10-03 NOTE — Progress Notes (Signed)
OT Cancellation Note  Patient Details Name: [name] MRN: [id number] DOB: [birth year]   Cancelled Treatment:    Reason Eval/Treat Not Completed: Patient at procedure or test/ unavailable.  Patient at [facility] for renal biopsy at this time. OT evaluation will be completed at another time.     Limmie Patricia, OTR/L,CBIS  [phone]  10/03/2019, 9:20 AM

## 2019-10-03 NOTE — Progress Notes (Signed)
Patient presents to [facility] today for random renal biopsy.  H&P completed by IR PA 10/02/19.  Consented by family as patient is confused.  She has a working IV in the right hand with saline running at 50 mL/hr.   Plan to proceed with renal biopsy.  Loyce Dys, MS RD PA-C 9:42 AM

## 2019-10-03 NOTE — Procedures (Signed)
Interventional Radiology Procedure Note  Procedure: US guided biopsy of right kidney, medical renal Complications: None EBL: None Recommendations: - Bedrest 2 hours.   - Routine wound care - Follow up pathology - Advance diet   Signed,  Jakerria Kingbird, DO   

## 2019-10-03 NOTE — Progress Notes (Signed)
After further discussion with primary team this AM a central line has been requested in patient with difficult IV access and renal insufficiency.  IR will place IJ temp cath for access.  Called husband for updated consent.   Loyce Dys, MS RD PA-C 11:14 AM

## 2019-10-03 NOTE — Procedures (Signed)
Interventional Radiology Procedure Note  Procedure: Placement of a right IJ approach double lumen central venous catheter.  Tip is positioned at the superior cavoatrial junction and catheter is ready for immediate use.   Complications: None  Recommendations:  - Ok to use - Do not submerge - Routine care   Signed,  Yvone Neu. Loreta Ave, DO

## 2019-10-03 NOTE — Sedation Documentation (Signed)
Next procedure to begin. Patients position changed for renal biopsy.

## 2019-10-04 ENCOUNTER — Encounter (HOSPITAL_COMMUNITY): Payer: Self-pay | Admitting: Radiology

## 2019-10-04 ENCOUNTER — Inpatient Hospital Stay (HOSPITAL_COMMUNITY): Payer: 59

## 2019-10-04 DIAGNOSIS — I340 Nonrheumatic mitral (valve) insufficiency: Secondary | ICD-10-CM

## 2019-10-04 DIAGNOSIS — I350 Nonrheumatic aortic (valve) stenosis: Secondary | ICD-10-CM

## 2019-10-04 DIAGNOSIS — I351 Nonrheumatic aortic (valve) insufficiency: Secondary | ICD-10-CM

## 2019-10-04 DIAGNOSIS — R410 Disorientation, unspecified: Secondary | ICD-10-CM | POA: Clinically undetermined

## 2019-10-04 LAB — URINE CULTURE
Culture: <10000 — AB
Special Requests: NORMAL

## 2019-10-04 LAB — RENAL FUNCTION PANEL
Albumin: 3 g/dL — ABNORMAL LOW (ref 3.5–5.0)
Anion gap: 8 (ref 5–15)
BUN: 73 mg/dL — ABNORMAL HIGH (ref 8–23)
CO2: 17 mmol/L — ABNORMAL LOW (ref 22–32)
Calcium: 8 mg/dL — ABNORMAL LOW (ref 8.9–10.3)
Chloride: 115 mmol/L — ABNORMAL HIGH (ref 98–111)
Creatinine, Ser: 5.4 mg/dL — ABNORMAL HIGH (ref 0.44–1.00)
GFR calc Af Amer: 8 mL/min — ABNORMAL LOW (ref >60)
GFR calc non Af Amer: 7 mL/min — ABNORMAL LOW (ref >60)
Glucose, Bld: 112 mg/dL — ABNORMAL HIGH (ref 70–99)
Phosphorus: 5 mg/dL — ABNORMAL HIGH (ref 2.5–4.6)
Potassium: 4.6 mmol/L (ref 3.5–5.1)
Sodium: 140 mmol/L (ref 135–145)

## 2019-10-04 LAB — ECHOCARDIOGRAM COMPLETE
AV Mean grad: 15.3 mmHg
AV Peak grad: 28.3 mmHg
Ao pk vel: 2.66 m/s
Area-P 1/2: 4.44 cm2
Height: 60 in
MV M vel: 4.61 m/s
MV Peak grad: 85 mmHg
P 1/2 time: 293 msec
S' Lateral: 3.14 cm
Weight: 2640 oz

## 2019-10-04 LAB — TROPONIN I (HIGH SENSITIVITY)
Troponin I (High Sensitivity): 77 ng/L — ABNORMAL HIGH (ref <18)
Troponin I (High Sensitivity): 68 ng/L — ABNORMAL HIGH (ref <18)

## 2019-10-04 LAB — CBC
HCT: 30.3 % — ABNORMAL LOW (ref 36.0–46.0)
Hemoglobin: 9 g/dL — ABNORMAL LOW (ref 12.0–15.0)
MCH: 29.9 pg (ref 26.0–34.0)
MCHC: 29.7 g/dL — ABNORMAL LOW (ref 30.0–36.0)
MCV: 100.7 fL — ABNORMAL HIGH (ref 80.0–100.0)
Platelets: 194 10*3/uL (ref 150–400)
RBC: 3.01 MIL/uL — ABNORMAL LOW (ref 3.87–5.11)
RDW: 13.6 % (ref 11.5–15.5)
WBC: 12.8 10*3/uL — ABNORMAL HIGH (ref 4.0–10.5)
nRBC: 0 % (ref 0.0–0.2)

## 2019-10-04 LAB — IMMUNOFIXATION, URINE

## 2019-10-04 MED ORDER — ALBUTEROL SULFATE (2.5 MG/3ML) 0.083% IN NEBU
2.5000 mg | INHALATION_SOLUTION | RESPIRATORY_TRACT | Status: DC | PRN
  Administered 2019-10-04: 2.5 mg via RESPIRATORY_TRACT
  Filled 2019-10-04: qty 3

## 2019-10-04 MED ORDER — OXYCODONE HCL 5 MG PO TABS
5.0000 mg | ORAL_TABLET | Freq: Three times a day (TID) | ORAL | Status: DC | PRN
  Administered 2019-10-04: 5 mg via ORAL
  Filled 2019-10-04: qty 1

## 2019-10-04 MED ORDER — HALOPERIDOL LACTATE 5 MG/ML IJ SOLN
2.0000 mg | Freq: Once | INTRAMUSCULAR | Status: AC
  Administered 2019-10-04: 2 mg via INTRAVENOUS
  Filled 2019-10-04: qty 1

## 2019-10-04 MED ORDER — TAMSULOSIN HCL 0.4 MG PO CAPS
0.4000 mg | ORAL_CAPSULE | Freq: Every day | ORAL | Status: DC
  Administered 2019-10-04 – 2019-10-05 (×2): 0.4 mg via ORAL
  Filled 2019-10-04 (×2): qty 1

## 2019-10-04 MED ORDER — QUETIAPINE FUMARATE 25 MG PO TABS
25.0000 mg | ORAL_TABLET | Freq: Once | ORAL | Status: AC
  Administered 2019-10-04: 25 mg via ORAL
  Filled 2019-10-04: qty 1

## 2019-10-04 MED ORDER — CHLORHEXIDINE GLUCONATE CLOTH 2 % EX PADS
6.0000 | MEDICATED_PAD | Freq: Every day | CUTANEOUS | Status: DC
  Administered 2019-10-04 – 2019-10-06 (×3): 6 via TOPICAL

## 2019-10-04 MED ORDER — NITROGLYCERIN 0.4 MG SL SUBL: 0.4000 mg | SUBLINGUAL_TABLET | SUBLINGUAL | Status: DC | PRN

## 2019-10-04 MED ORDER — HYDROXYZINE HCL 10 MG PO TABS
10.0000 mg | ORAL_TABLET | Freq: Four times a day (QID) | ORAL | Status: DC | PRN
  Administered 2019-10-04 – 2019-10-05 (×2): 10 mg via ORAL
  Filled 2019-10-04 (×2): qty 1

## 2019-10-04 NOTE — Progress Notes (Signed)
MD notified of wheezing and no PRN orders.

## 2019-10-04 NOTE — Progress Notes (Signed)
MD notified of pt reported new onset of chest pressure. Vitals wnl for pt. Pt alert, responding appropriately. Awaiting further instruction

## 2019-10-04 NOTE — Progress Notes (Signed)
Patient ID: [name], female   DOB: [birth year], 73 y.o.   MRN: [id number] S: Confused today.  Tolerated renal biopsy well and has right ij central line in place. O:BP (!) 107/48 (BP Location: Left Arm)   Pulse 95   Temp 98 F (36.7 C) (Oral)   Resp 17   Ht 5' (1.524 m)   Wt 74.8 kg   SpO2 98%   BMI 32.22 kg/m   Intake/Output Summary (Last 24 hours) at 10/04/2019 9381 Last data filed at 10/04/2019 0400 Gross per 24 hour  Intake 1242.77 ml  Output 400 ml  Net 842.77 ml   Intake/Output: I/O last 3 completed shifts: In: 1242.8 [I.V.:1242.8] Out: 1150 [Urine:1150]  Intake/Output this shift:  No intake/output data recorded. Weight change:  Gen: WD obese WF who appears fatigued and confused CVS: RRR, no rub Resp: occ exp wheezes bilaterally Abd: +BS, soft, NT/ND Ext: no edema  Recent Labs  Lab 09/30/19 1249 09/30/19 1739 09/30/19 2035 10/01/19 0356 10/02/19 0615 10/03/19 0724 10/04/19 0237  NA 134*  --  134* 136 138 137 140  K 6.1*  --  5.5* 4.9 5.2* 4.6 4.6  CL 100  --  104 106 109 113* 115*  CO2 23  --  18* 17* 17* 15* 17*  GLUCOSE 94  --  79 70 107* 90 112*  BUN 84*  --  85* 81* 78* 76* 73*  CREATININE 8.64*  --  8.47* 8.06* 7.51* 6.21* 5.40*  ALBUMIN 3.5  --   --  3.0* 3.3* 3.1* 3.0*  CALCIUM 8.3*  --  7.5* 7.2* 7.6* 7.9* 8.0*  PHOS  --  7.7*  --  7.3* 6.4*  --  5.0*  AST 132*  --   --  103*  --  92*  --   ALT 39  --   --  34  --  41  --    Liver Function Tests: Recent Labs  Lab 09/30/19 1249 09/30/19 1249 10/01/19 0356 10/01/19 0356 10/02/19 0615 10/03/19 0724 10/04/19 0237  AST 132*  --  103*  --   --  92*  --   ALT 39  --  34  --   --  41  --   ALKPHOS 42  --  35*  --   --  32*  --   BILITOT 1.4*  --  1.0  --   --  1.1  --   PROT 7.3  --  6.0*  --   --  6.4*  --   ALBUMIN 3.5   < > 3.0*   < > 3.3* 3.1* 3.0*   < > = values in this interval not displayed.   Recent Labs  Lab 09/30/19 1249  LIPASE 41   No results for input(s): AMMONIA in  the last 168 hours. CBC: Recent Labs  Lab 09/30/19 1249 09/30/19 1249 10/01/19 0356 10/01/19 0356 10/02/19 0615 10/03/19 0724 10/04/19 0237  WBC 10.7*   < > 11.0*   < > 12.1* 12.1* 12.8*  NEUTROABS  --   --   --   --   --  9.5*  --   HGB 11.9*   < > 9.9*   < > 10.1* 9.5* 9.0*  HCT 40.6   < > 35.0*   < > 35.5* 32.8* 30.3*  MCV 101.8*  --  103.9*  --  102.0* 101.9* 100.7*  PLT 177   < > 171   < > 171 153 194   < > =  values in this interval not displayed.   Cardiac Enzymes: Recent Labs  Lab 09/30/19 1249 10/01/19 1235 10/03/19 0724  CKTOTAL 2,469* 3,290* 1,038*   CBG: No results for input(s): GLUCAP in the last 168 hours.  Iron Studies: No results for input(s): IRON, TIBC, TRANSFERRIN, FERRITIN in the last 72 hours. Studies/Results: US RENAL  Result Date: 10/02/2019 CLINICAL DATA:  Inpatient.  Acute renal failure. EXAM: RENAL / URINARY TRACT ULTRASOUND COMPLETE COMPARISON:  09/30/2019 CT abdomen/pelvis. 09/04/2013 abdominal sonogram. FINDINGS: Right Kidney: Renal measurements: 10.2 x 4.3 x 6.1 cm = volume: 139 mL. Mildly echogenic right renal parenchyma, normal thickness. No hydronephrosis. No mass. Left Kidney: Renal measurements: 10.0 x 5.9 x 7.1 cm = volume: 218 mL. Mildly echogenic left renal parenchyma, normal thickness. Mild left hydronephrosis. No mass. Bladder: Appears normal for degree of bladder distention. Other: None. IMPRESSION: 1. Mild left hydronephrosis of uncertain etiology. No right hydronephrosis. Suggest correlation with urinalysis. Consider short-term follow-up evaluation with hematuria protocol CT abdomen/pelvis without and with IV contrast. 2. Echogenic normal size kidneys, indicative of acute nonspecific renal parenchymal disease. 3. Normal bladder. Electronically Signed   By: Delbert PhenixJason A Poff M.D.   On: 10/02/2019 12:44   IR Fluoro Guide CV Line Right  Result Date: 10/03/2019 INDICATION: 74 year old female referred for central venous access EXAM: IMAGE GUIDED  CENTRAL VENOUS CATHETER MEDICATIONS: None ANESTHESIA/SEDATION: Moderate (conscious) sedation was employed during this procedure. A total of Versed 0.5 mg and Fentanyl 25 mcg was administered intravenously. Moderate Sedation Time: 15 minutes. The patient's level of consciousness and vital signs were monitored continuously by radiology nursing throughout the procedure under my direct supervision. FLUOROSCOPY TIME:  Fluoroscopy Time: 0 minutes 6 seconds (1 mGy). COMPLICATIONS: None PROCEDURE: Informed written consent was obtained from the patient after a thorough discussion of the procedural risks, benefits and alternatives. All questions were addressed. Maximal Sterile Barrier Technique was utilized including caps, mask, sterile gowns, sterile gloves, sterile drape, hand hygiene and skin antiseptic. A timeout was performed prior to the initiation of the procedure. Patient positioned supine position on fluoroscopy table. The right IJ region was prepped and draped in the usual sterile fashion. 1% lidocaine was used for local anesthesia. Micro puncture was used to access the right internal jugular vein and a microwire was advanced under fluoroscopy. Peel-away sheath was placed on the microwire and microwire was withdrawn measuring the internal length of a double-lumen central venous catheter. Central venous catheter with 2 lumen was ligated at 15 cm. Catheter was passed through the peel-away sheath and the peel-away sheath was removed. Wire was removed. Final image was stored. Catheter was flushed with saline and capped.  Sutured in position. Dermabond was used to seal some venous who is at the right IJ. Under site. Patient tolerated the procedure well and remained hemodynamically stable throughout. No complications were encountered and no significant blood loss. IMPRESSION: Status post image guided placement of right IJ central venous catheter. Signed, Yvone NeuJaime S. Reyne DumasWagner, DO, RPVI [facility]ascular and Interventional Radiology  Special[facility] Electronically Signed   By: Gilmer MorJaime  Wagner D.O.   On: 10/03/2019 12:17   IR US Guide Vasc Access Right  Result Date: 10/03/2019 INDICATION: 74 year old female referred for central venous access EXAM: IMAGE GUIDED CENTRAL VENOUS CATHETER MEDICATIONS: None ANESTHESIA/SEDATION: Moderate (conscious) sedation was employed during this procedure. A total of Versed 0.5 mg and Fentanyl 25 mcg was administered intravenously. Moderate Sedation Time: 15 minutes. The patient's level of consciousness and vital signs were monitored continuously by radiology nursing  throughout the procedure under my direct supervision. FLUOROSCOPY TIME:  Fluoroscopy Time: 0 minutes 6 seconds (1 mGy). COMPLICATIONS: None PROCEDURE: Informed written consent was obtained from the patient after a thorough discussion of the procedural risks, benefits and alternatives. All questions were addressed. Maximal Sterile Barrier Technique was utilized including caps, mask, sterile gowns, sterile gloves, sterile drape, hand hygiene and skin antiseptic. A timeout was performed prior to the initiation of the procedure. Patient positioned supine position on fluoroscopy table. The right IJ region was prepped and draped in the usual sterile fashion. 1% lidocaine was used for local anesthesia. Micro puncture was used to access the right internal jugular vein and a microwire was advanced under fluoroscopy. Peel-away sheath was placed on the microwire and microwire was withdrawn measuring the internal length of a double-lumen central venous catheter. Central venous catheter with 2 lumen was ligated at 15 cm. Catheter was passed through the peel-away sheath and the peel-away sheath was removed. Wire was removed. Final image was stored. Catheter was flushed with saline and capped.  Sutured in position. Dermabond was used to seal some venous who is at the right IJ. Under site. Patient tolerated the procedure well and remained  hemodynamically stable throughout. No complications were encountered and no significant blood loss. IMPRESSION: Status post image guided placement of right IJ central venous catheter. Signed, Yvone Neu. Reyne Dumas, RPVI Vascular and Interventional Radiology Specialists [facility] Electronically Signed   By: Gilmer Mor D.O.   On: 10/03/2019 12:17   IR US Guide Bx Asp/Drain  Result Date: 10/03/2019 INDICATION: 74 year old female with a history renal failure referred for biopsy EXAM: IMAGE GUIDED MEDICAL RENAL BIOPSY MEDICATIONS: None. ANESTHESIA/SEDATION: Moderate (conscious) sedation was employed during this procedure. A total of Versed 1.5 mg and Fentanyl 75 mcg was administered intravenously. Moderate Sedation Time: 31 minutes. The patient's level of consciousness and vital signs were monitored continuously by radiology nursing throughout the procedure under my direct supervision. FLUOROSCOPY TIME:  Ultrasound COMPLICATIONS: None PROCEDURE: Informed written consent was obtained from the patient after a thorough discussion of the procedural risks, benefits and alternatives. All questions were addressed. Maximal Sterile Barrier Technique was utilized including caps, mask, sterile gowns, sterile gloves, sterile drape, hand hygiene and skin antiseptic. A timeout was performed prior to the initiation of the procedure. Patient was positioned prone position on the gantry table. Images were stored sent to PACs. Once the patient is prepped and draped in the usual sterile fashion, the skin and subcutaneous tissues overlying the right kidney were generously infiltrated 1% lidocaine for local anesthesia. Using ultrasound guidance, a 17 gauge guide needle was advanced into the cortex of the right kidney. Once we confirmed location of the needle tip, 2 separate 18 gauge core biopsy were achieved. Two Gel-Foam pledgets were infused with a small amount of saline. The needle was removed. Final images were stored. The  patient tolerated the procedure well and remained hemodynamically stable throughout. No complications were encountered and no significant blood loss encountered. IMPRESSION: Status post image guided right medical renal biopsy. Signed, Yvone Neu. Reyne Dumas, RPVI Vascular and Interventional Radiology Specialists [facility] Electronically Signed   By: Gilmer Mor D.O.   On: 10/03/2019 13:06   . Chlorhexidine Gluconate Cloth  6 each Topical Daily  . pantoprazole  40 mg Oral Daily  . sodium zirconium cyclosilicate  10 g Oral Daily    BMET    Component Value Date/Time   NA 140 10/04/2019 0237   K 4.6 10/04/2019  0237   CL 115 (H) 10/04/2019 0237   CO2 17 (L) 10/04/2019 0237   GLUCOSE 112 (H) 10/04/2019 0237   BUN 73 (H) 10/04/2019 0237   CREATININE 5.40 (H) 10/04/2019 0237   CREATININE 1.02 (H) 09/11/2017 0823   CALCIUM 8.0 (L) 10/04/2019 0237   GFRNONAA 7 (L) 10/04/2019 0237   GFRAA 8 (L) 10/04/2019 0237   CBC    Component Value Date/Time   WBC 12.8 (H) 10/04/2019 0237   RBC 3.01 (L) 10/04/2019 0237   HGB 9.0 (L) 10/04/2019 0237   HCT 30.3 (L) 10/04/2019 0237   PLT 194 10/04/2019 0237   MCV 100.7 (H) 10/04/2019 0237   MCH 29.9 10/04/2019 0237   MCHC 29.7 (L) 10/04/2019 0237   RDW 13.6 10/04/2019 0237   LYMPHSABS 1.8 10/03/2019 0724   MONOABS 0.7 10/03/2019 0724   EOSABS 0.0 10/03/2019 0724   BASOSABS 0.0 10/03/2019 0724    Assessment/Plan:  1. AKI- Scr has risen from 1.24 on 09/19/19 to 8.64 on 09/30/19. UA + for blood and proteinuria. Treated with cipro 2 weeks ago so AIN possible, however she is not longer taking the antibiotic. Possible ischemic ATN +/- AIN.  Serologies negative 1. covid-19, ANA, ASO, HIV, dsDNA, RF, and ANCA all negative 2. Normal complements 3. CKrising from2500to 3200 despite IVF's,but not enough to account for severe decline in renal function in 2 weeks. 4. Mildly elevated kappa/lamda light chains will order SPEP and UPEP 5. REnal  biopsy obtained 10/03/19 6. No indication fordialysis at this time as renal function slowly improving with IVF's.  2. Hyperkalemia- due to #1will continue lokelma 3. HTN- off ace-inhibitor 4. Nonsustained Vtach- s/p ICD.  Resumed coreg per primary. 5. Falls/deconditioning- will likely require SNF placement after discharge 6. COPD- starting to wheeze.  Nebs per primary. 7. HCV, chronic  8. Anemia- of acute illness 9. Elevated CPK- mild rhabdo-follow levels but now starting to decline with IVF's  Will follow labs peripherally and see her again on 10/07/19 unless her condition changes for the worse.  Irena Cords, MD [employer] [phone]

## 2019-10-04 NOTE — Progress Notes (Addendum)
PROGRESS NOTE    MONQUE HAGGAR  ZOX:[id number] DOB: October 11, 1945 DOA: 09/30/2019 PCP: Georgeann Oppenheim, NP    Brief Narrative:   Irania Durell  is a 74 y.o. female, with possible chronic hepatitis C. GERD, H. pylori gastritis. chronic combined systolic and diastolic heart failure, ICD, COPD, CAD, hypertension, obstructive sleep apnea, nonsustained ventricular tachycardia. LVEF 50% August 2019, grade 1 diastolic dysfunction and mild aortic stenosis. -Patient presents to ED today secondary to neck and back pain after falls, patient reports has been having unsteady gait, weakness and fatigue for last few days, patient was recently seen at [facility] for hematuria, her CT abdomen pelvis done with no acute findings, her creatinine was at 1.24, her UA did show some proteinuria and large bloods, she was treated with ciprofloxacin, urine cultures were not sent, patient presents to ED secondary to worsening fatigue, weakness, unsteady gait, she denies any chest pain, nausea or vomiting, denies any dysuria, but reports hematuria has resolved.  As well patiently was started to be seen recently by GI for diagnosis of chronic hepatitis C. - in ED her work-up was significant for creatinine of 2.6, potassium of 6.1, EKG showing prolonged QTC at 560, CT abdomen pelvis done showing no obstructive uropathy, no stones, no acute findings, currently her UA remains pending, LFTs elevated including AST of 132, total bili of 1.4, ALT within normal limit, [facility] consulted to admit.   Assessment & Plan:   Active Problems:   HLD (hyperlipidemia)   HTN (hypertension)   VENTRICULAR TACHYCARDIA   Implantable cardioverter-defibrillator (ICD) in situ   Abnormal thyroid function test   Cardiomyopathy, ischemic   Chronic hepatitis C (HCC)   GERD (gastroesophageal reflux disease)   AKI (acute kidney injury) (HCC)   Delirium   1)Acute kidney injury--- suspect component of dehydration, rhabdomyolysis  compounded by ACE inhibitor use --Creatinine currently down to 5.40 from 8.64 on 09/30/19 (baseline 1.2 (09/19/19)  her urinalysis shows blood and proteinuria.  She was taking Cipro prior to admission.   AIN less likely given lack of improvement after discontinuation of Cipro -Glomerulonephritis appears more likely, serologies Neg, S/p kidney biopsy on 10/03/2019, pathology pending -Continue IV fluids, discussed with nephrology service -renally adjust medications, avoid nephrotoxic agents / dehydration  / hypotension -Oliguria persist, --- intermittent confusion may be opiate related rather than uremia  2)Hyperkalemia.  Secondary to acute kidney injury--- potassium improved currently 4.6, after Lokelma  3)Rhabdomyolysis.  Mild elevation of CK secondary to immobilization on the floor, poor CK clearance due to AKI-- 2,469 >>3,290>>>1,038  --Overall improving with IV fluids  Continue on IV fluids and monitor urine output  3)Hypertension.  Chronically on ACE inhibitor which is been discontinued.  4)Chronic Hep C---- Followed by GI as an outpatient,  =-AST is down to 92 from 132, ALT 41  T.bili 1.1  5)Peripheral arterial disease.  Chronically on Xarelto and statin.  These are currently on hold.  6)Nonsustained ventricular tachycardia status post ICD.  Followed by Dr. Johney Frame.  Resume Coreg once blood pressure stabilizes.  7)Leukocytosis noted-- urine culture, Blood culture NGTD  8) acute anemia--hemoglobin down to around 9 from 11.9 on admission, suspect related to  hemodilution from IV fluids  9) acute metabolic encephalopathy-- intermittent confusion may be opiate related rather than uremia --Stop opiates  10) elevated troponin- Episodes of possible chest discomfort, borderline troponin noted, echo with preserved EF of 55 to 60%, grade 2 diastolic dysfunction without significant regional wall motion abnormalities -Curbside discussion with cardiology service, no  further ischemia work-up  planned at this time     DVT prophylaxis: SCDs Start: 09/30/19 1918  Code Status: Full code Family Communication: Updated husband over the phone Disposition Plan: Status is: Inpatient  Remains inpatient appropriate because:Inpatient level of care appropriate due to severity of illness   Dispo: The patient is from: Home              Anticipated d/c is to: SNF              Anticipated d/c date is: 3 days              Patient currently is not medically stable to d/c.  Patient needs continued work-up/treatment of renal failure, need for IV fluids and awaiting renal biopsy   Consultants:   Nephrology  Procedures:   Renal biopsy 10/03/2019 Right IJ central line 10/03/2019 Antimicrobials:       Subjective: Daughter at bedside, patient remains intermittently confused -No fevers no vomiting -Episodes of possible chest discomfort, borderline troponin noted,   Objective: Vitals:   10/03/19 2110 10/04/19 0100 10/04/19 0609 10/04/19 1022  BP: 121/63 (!) 142/66 (!) 107/48   Pulse: 92 92 95   Resp: 16 16 17    Temp: 98.2 F (36.8 C) 98.4 F (36.9 C) 98 F (36.7 C)   TempSrc: Oral Oral Oral   SpO2: 100% 98% 98% 98%  Weight:      Height:        Intake/Output Summary (Last 24 hours) at 10/04/2019 1649 Last data filed at 10/04/2019 0400 Gross per 24 hour  Intake 1242.77 ml  Output --  Net 1242.77 ml   Filed Weights   09/30/19 1118  Weight: 74.8 kg    Examination:  General exam: In no acute distress, Neck-right IJ central line Respiratory system: Clear to auscultation. Respiratory effort normal. Cardiovascular system: S1 & S2 heard, RRR.  AICD in situ Gastrointestinal system: Abdomen is nondistended, soft and nontender.  Normal bowel sounds heard.  No CVA area tenderness Central nervous system: Generalized weakness, no focal neurological deficits. Extremities: Symmetric 5 x 5 power. Skin: No rashes, lesions or ulcers Psychiatry: Occasional episodes of confusion and  disorientation    Data Reviewed:    CBC: Recent Labs  Lab 09/30/19 1249 10/01/19 0356 10/02/19 0615 10/03/19 0724 10/04/19 0237  WBC 10.7* 11.0* 12.1* 12.1* 12.8*  NEUTROABS  --   --   --  9.5*  --   HGB 11.9* 9.9* 10.1* 9.5* 9.0*  HCT 40.6 35.0* 35.5* 32.8* 30.3*  MCV 101.8* 103.9* 102.0* 101.9* 100.7*  PLT 177 171 171 153 194   Basic Metabolic Panel: Recent Labs  Lab 09/30/19 1249 09/30/19 1739 09/30/19 2035 10/01/19 0356 10/02/19 0615 10/03/19 0724 10/04/19 0237  NA   < >  --  134* 136 138 137 140  K   < >  --  5.5* 4.9 5.2* 4.6 4.6  CL   < >  --  104 106 109 113* 115*  CO2   < >  --  18* 17* 17* 15* 17*  GLUCOSE   < >  --  79 70 107* 90 112*  BUN   < >  --  85* 81* 78* 76* 73*  CREATININE   < >  --  8.47* 8.06* 7.51* 6.21* 5.40*  CALCIUM   < >  --  7.5* 7.2* 7.6* 7.9* 8.0*  MG  --   --   --   --   --  1.8  --  PHOS  --  7.7*  --  7.3* 6.4*  --  5.0*   < > = values in this interval not displayed.   GFR: Estimated Creatinine Clearance: 8.4 mL/min (A) (by C-G formula based on SCr of 5.4 mg/dL (H)). Liver Function Tests: Recent Labs  Lab 09/30/19 1249 10/01/19 0356 10/02/19 0615 10/03/19 0724 10/04/19 0237  AST 132* 103*  --  92*  --   ALT 39 34  --  41  --   ALKPHOS 42 35*  --  32*  --   BILITOT 1.4* 1.0  --  1.1  --   PROT 7.3 6.0*  --  6.4*  --   ALBUMIN 3.5 3.0* 3.3* 3.1* 3.0*   Recent Labs  Lab 09/30/19 1249  LIPASE 41   No results for input(s): AMMONIA in the last 168 hours. Coagulation Profile: Recent Labs  Lab 09/30/19 1739 10/01/19 0356  INR 1.2 1.1   Cardiac Enzymes: Recent Labs  Lab 09/30/19 1249 10/01/19 1235 10/03/19 0724  CKTOTAL 2,469* 3,290* 1,038*   BNP (last 3 results) No results for input(s): PROBNP in the last 8760 hours. HbA1C: No results for input(s): HGBA1C in the last 72 hours. CBG: No results for input(s): GLUCAP in the last 168 hours. Lipid Profile: No results for input(s): CHOL, HDL, LDLCALC, TRIG,  CHOLHDL, LDLDIRECT in the last 72 hours. Thyroid Function Tests: No results for input(s): TSH, T4TOTAL, FREET4, T3FREE, THYROIDAB in the last 72 hours. Anemia Panel: No results for input(s): VITAMINB12, FOLATE, FERRITIN, TIBC, IRON, RETICCTPCT in the last 72 hours. Sepsis Labs: No results for input(s): PROCALCITON, LATICACIDVEN in the last 168 hours.  Recent Results (from the past 240 hour(s))  SARS Coronavirus 2 by RT PCR (hospital order, performed in [facility] hospital lab) Nasopharyngeal Nasopharyngeal Swab     Status: None   Collection Time: 09/30/19  4:28 PM   Specimen: Nasopharyngeal Swab  Result Value Ref Range Status   SARS Coronavirus 2 NEGATIVE NEGATIVE Final    Comment: (NOTE) SARS-CoV-2 target nucleic acids are NOT DETECTED.  The SARS-CoV-2 RNA is generally detectable in upper and lower respiratory specimens during the acute phase of infection. The lowest concentration of SARS-CoV-2 viral copies this assay can detect is 250 copies / mL. A negative result does not preclude SARS-CoV-2 infection and should not be used as the sole basis for treatment or other patient management decisions.  A negative result may occur with improper specimen collection / handling, submission of specimen other than nasopharyngeal swab, presence of viral mutation(s) within the areas targeted by this assay, and inadequate number of viral copies (<250 copies / mL). A negative result must be combined with clinical observations, patient history, and epidemiological information.  Fact Sheet for Patients:   BoilerBrush.com.cy  Fact Sheet for Healthcare Providers: https://pope.com/  This test is not yet approved or  cleared by the [place] FDA and has been authorized for detection and/or diagnosis of SARS-CoV-2 by FDA under an Emergency Use Authorization (EUA).  This EUA will remain in effect (meaning this test can be used) for the duration of  the COVID-19 declaration under Section 564(b)(1) of the Act, 21 U.S.C. section 360bbb-3(b)(1), unless the authorization is terminated or revoked sooner.  Performed at [facility], [address], [place], [place] [address]   Culture, blood (Routine X 2) w Reflex to ID Panel     Status: None (Preliminary result)   Collection Time: 10/02/19  6:19 PM   Specimen: BLOOD RIGHT WRIST  Result Value Ref Range Status   Specimen Description BLOOD RIGHT WRIST  Final   Special Requests   Final    BOTTLES DRAWN AEROBIC ONLY Blood Culture results may not be optimal due to an inadequate volume of blood received in culture bottles   Culture   Final    NO GROWTH 2 DAYS Performed at [facility], [address] [place], [place] [address]    Report Status PENDING  Incomplete  Culture, blood (Routine X 2) w Reflex to ID Panel     Status: None (Preliminary result)   Collection Time: 10/02/19  9:08 PM   Specimen: BLOOD LEFT HAND  Result Value Ref Range Status   Specimen Description BLOOD LEFT HAND  Final   Special Requests   Final    BOTTLES DRAWN AEROBIC AND ANAEROBIC Blood Culture adequate volume   Culture   Final    NO GROWTH 2 DAYS Performed at [facility], [address], [place], [place] [address]    Report Status PENDING  Incomplete  Urine Culture     Status: Abnormal   Collection Time: 10/02/19 11:00 PM   Specimen: Urine, Clean Catch  Result Value Ref Range Status   Specimen Description   Final    URINE, CLEAN CATCH Performed at [facility], [address] [place], [place] [address]    Special Requests   Final    Normal Performed at [facility], [address], [place], [place] [address]    Culture (A)  Final    <10,000 COLONIES/mL INSIGNIFICANT GROWTH Performed at [facility], [address] [place], [place] [address]    Report Status 10/04/2019 FINAL  Final         Radiology Studies: IR Fluoro Guide CV Line Right  Result Date: 10/03/2019 INDICATION: 74 year old  female referred for central venous access EXAM: IMAGE GUIDED CENTRAL VENOUS CATHETER MEDICATIONS: None ANESTHESIA/SEDATION: Moderate (conscious) sedation was employed during this procedure. A total of Versed 0.5 mg and Fentanyl 25 mcg was administered intravenously. Moderate Sedation Time: 15 minutes. The patient's level of consciousness and vital signs were monitored continuously by radiology nursing throughout the procedure under my direct supervision. FLUOROSCOPY TIME:  Fluoroscopy Time: 0 minutes 6 seconds (1 mGy). COMPLICATIONS: None PROCEDURE: Informed written consent was obtained from the patient after a thorough discussion of the procedural risks, benefits and alternatives. All questions were addressed. Maximal Sterile Barrier Technique was utilized including caps, mask, sterile gowns, sterile gloves, sterile drape, hand hygiene and skin antiseptic. A timeout was performed prior to the initiation of the procedure. Patient positioned supine position on fluoroscopy table. The right IJ region was prepped and draped in the usual sterile fashion. 1% lidocaine was used for local anesthesia. Micro puncture was used to access the right internal jugular vein and a microwire was advanced under fluoroscopy. Peel-away sheath was placed on the microwire and microwire was withdrawn measuring the internal length of a double-lumen central venous catheter. Central venous catheter with 2 lumen was ligated at 15 cm. Catheter was passed through the peel-away sheath and the peel-away sheath was removed. Wire was removed. Final image was stored. Catheter was flushed with saline and capped.  Sutured in position. Dermabond was used to seal some venous who is at the right IJ. Under site. Patient tolerated the procedure well and remained hemodynamically stable throughout. No complications were encountered and no significant blood loss. IMPRESSION: Status post image guided placement of right IJ central venous catheter. Signed, Yvone Neu. Loreta Ave,  DO, RPVI [facility] Radiology Electronically Signed   By: Gilmer Mor D.O.   On: 10/03/2019 12:17   IR US Guide Vasc Access Right  Result Date: 10/03/2019 INDICATION: 74 year old female referred for central venous access EXAM: IMAGE GUIDED CENTRAL VENOUS CATHETER MEDICATIONS: None ANESTHESIA/SEDATION: Moderate (conscious) sedation was employed during this procedure. A total of Versed 0.5 mg and Fentanyl 25 mcg was administered intravenously. Moderate Sedation Time: 15 minutes. The patient's level of consciousness and vital signs were monitored continuously by radiology nursing throughout the procedure under my direct supervision. FLUOROSCOPY TIME:  Fluoroscopy Time: 0 minutes 6 seconds (1 mGy). COMPLICATIONS: None PROCEDURE: Informed written consent was obtained from the patient after a thorough discussion of the procedural risks, benefits and alternatives. All questions were addressed. Maximal Sterile Barrier Technique was utilized including caps, mask, sterile gowns, sterile gloves, sterile drape, hand hygiene and skin antiseptic. A timeout was performed prior to the initiation of the procedure. Patient positioned supine position on fluoroscopy table. The right IJ region was prepped and draped in the usual sterile fashion. 1% lidocaine was used for local anesthesia. Micro puncture was used to access the right internal jugular vein and a microwire was advanced under fluoroscopy. Peel-away sheath was placed on the microwire and microwire was withdrawn measuring the internal length of a double-lumen central venous catheter. Central venous catheter with 2 lumen was ligated at 15 cm. Catheter was passed through the peel-away sheath and the peel-away sheath was removed. Wire was removed. Final image was stored. Catheter was flushed with saline and capped.  Sutured in position. Dermabond was used to seal some venous who is at the right IJ. Under site.  Patient tolerated the procedure well and remained hemodynamically stable throughout. No complications were encountered and no significant blood loss. IMPRESSION: Status post image guided placement of right IJ central venous catheter. Signed, Yvone Neu. Reyne Dumas, RPVI [facility] Radiology Electronically Signed   By: Gilmer Mor D.O.   On: 10/03/2019 12:17   DG CHEST PORT 1 VIEW  Result Date: 10/04/2019 CLINICAL DATA:  Shortness of breath EXAM: PORTABLE CHEST 1 VIEW COMPARISON:  09/13/2017 FINDINGS: 0939 hours. The cardio pericardial silhouette is enlarged. Interstitial markings are diffusely coarsened with chronic features. Right IJ central line tip overlies the distal SVC level. Left-sided pacer/AICD again noted. Bones are diffusely demineralized. Fixation hardware in the left humerus has been incompletely visualized. Telemetry leads overlie the chest. IMPRESSION: Cardiomegaly with chronic interstitial opacity. No acute cardiopulmonary findings. Electronically Signed   By: Kennith Center M.D.   On: 10/04/2019 09:56   ECHOCARDIOGRAM COMPLETE  Result Date: 10/04/2019    ECHOCARDIOGRAM REPORT   Patient Name:   MALORY SPURR Copley Date of Exam: 10/04/2019 Medical Rec #:  [id number]         Height:       60.0 in Accession #:    [id number]        Weight:       165.0 lb Date of Birth:  Mar 09, 1945        BSA:          1.720 m Patient Age:    73 years          BP:           107/48 mmHg Patient Gender: F                 HR:  95 bpm. [facility]: 2D Echo Indications:    Dyspnea 786.09 / R06.00  History:        Patient has prior history of Echocardiogram examinations, most                 recent 09/07/2017. Defibrillator, Signs/Symptoms:Chest Pain; Risk                 Factors:Dyslipidemia, Hypertension and Current Smoker. Chronic                 hepatitis C.  Sonographer:    Jeryl ColumbiaJohanna Elliott RDCS (AE) Referring Phys: [id number] Yadiel Aubry  IMPRESSIONS  1. Left ventricular ejection fraction, by estimation, is 55 to 60%. The left ventricle has normal function. The left ventricle has no regional wall motion abnormalities. There is mild left ventricular hypertrophy. Left ventricular diastolic parameters are consistent with Grade II diastolic dysfunction (pseudonormalization).  2. Right ventricular systolic function is normal. The right ventricular size is normal. A device wire is visualized. There is mildly elevated pulmonary artery systolic pressure. The estimated right ventricular systolic pressure is 37.4 mmHg.  3. The mitral valve is grossly normal. Mild mitral valve regurgitation.  4. The aortic valve was not well visualized, moderately calcified. Aortic valve regurgitation is mild. Mild aortic valve stenosis. Aortic valve mean gradient measures 15.3 mmHg. Aortic valve Vmax measures 2.66 m/s.  5. The inferior vena cava is normal in size with <50% respiratory variability, suggesting right atrial pressure of 8 mmHg. FINDINGS  Left Ventricle: Left ventricular ejection fraction, by estimation, is 55 to 60%. The left ventricle has normal function. The left ventricle has no regional wall motion abnormalities. The left ventricular internal cavity size was normal in size. There is  mild left ventricular hypertrophy. Left ventricular diastolic parameters are consistent with Grade II diastolic dysfunction (pseudonormalization). Right Ventricle: The right ventricular size is normal. No increase in right ventricular wall thickness. Right ventricular systolic function is normal. There is mildly elevated pulmonary artery systolic pressure. The tricuspid regurgitant velocity is 2.71  m/s, and with an assumed right atrial pressure of 8 mmHg, the estimated right ventricular systolic pressure is 37.4 mmHg. Left Atrium: Left atrial size was normal in size. Right Atrium: Right atrial size was normal in size. Pericardium: There is no evidence of pericardial effusion.  Presence of pericardial fat pad. Mitral Valve: The mitral valve is grossly normal. There is mild thickening of the mitral valve leaflet(s). Mild to moderate mitral annular calcification. Mild mitral valve regurgitation. Tricuspid Valve: The tricuspid valve is grossly normal. Tricuspid valve regurgitation is mild. Aortic Valve: The aortic valve was not well visualized. Aortic valve regurgitation is mild. Aortic regurgitation PHT measures 293 msec. Mild aortic stenosis is present. There is moderate calcification of the aortic valve. Aortic valve mean gradient measures 15.3 mmHg. Aortic valve peak gradient measures 28.3 mmHg. Pulmonic Valve: The pulmonic valve was not well visualized. Pulmonic valve regurgitation is trivial. Aorta: The aortic root is normal in size and structure. Venous: The inferior vena cava is normal in size with less than 50% respiratory variability, suggesting right atrial pressure of 8 mmHg. IAS/Shunts: No atrial level shunt detected by color flow Doppler. Additional Comments: A device wire is visualized.  LEFT VENTRICLE PLAX 2D LVIDd:         4.50 cm Diastology LVIDs:         3.14 cm LV e' lateral:   6.31 cm/s LV PW:  1.36 cm LV E/e' lateral: 20.3 LV IVS:        1.16 cm LV e' medial:    6.53 cm/s                        LV E/e' medial:  19.6  RIGHT VENTRICLE RV S prime:     14.10 cm/s TAPSE (M-mode): 2.0 cm LEFT ATRIUM             Index       RIGHT ATRIUM           Index LA diam:        3.10 cm 1.80 cm/m  RA Area:     13.00 cm LA Vol (A2C):   51.7 ml 30.06 ml/m RA Volume:   32.90 ml  19.13 ml/m LA Vol (A4C):   21.7 ml 12.62 ml/m LA Biplane Vol: 36.4 ml 21.16 ml/m  AORTIC VALVE AV Vmax:           266.00 cm/s AV Vmean:          186.000 cm/s AV VTI:            0.602 m AV Peak Grad:      28.3 mmHg AV Mean Grad:      15.3 mmHg LVOT Vmax:         60.03 cm/s LVOT Vmean:        43.300 cm/s LVOT VTI:          0.124 m LVOT/AV VTI ratio: 0.21 AI PHT:            293 msec  AORTA Ao Root diam:  2.60 cm MITRAL VALVE                TRICUSPID VALVE MV Area (PHT): 4.44 cm     TR Peak grad:   29.4 mmHg MV Decel Time: 171 msec     TR Vmax:        271.00 cm/s MR Peak grad: 85.0 mmHg MR Vmax:      461.00 cm/s   SHUNTS MV E velocity: 128.00 cm/s  Systemic VTI: 0.12 m MV A velocity: 107.00 cm/s MV E/A ratio:  1.20 Nona Dell MD Electronically signed by Nona Dell MD Signature Date/Time: 10/04/2019/10:48:45 AM    Final     Scheduled Meds: . Chlorhexidine Gluconate Cloth  6 each Topical Daily  . pantoprazole  40 mg Oral Daily  . sodium zirconium cyclosilicate  10 g Oral Daily   Continuous Infusions: . sodium chloride 125 mL/hr at 10/04/19 1614     LOS: 4 days    Shon Hale, MD [facility]   If 7PM-7AM, please contact night-coverage [email or web address]  10/04/2019, 4:49 PM

## 2019-10-04 NOTE — Progress Notes (Signed)
*  PRELIMINARY RESULTS* Echocardiogram 2D Echocardiogram has been performed.  [name] 10/04/2019, 9:37 AM

## 2019-10-04 NOTE — Care Management Important Message (Signed)
Important Message  Patient Details  Name: [name] MRN: [id number] Date of Birth: 05/01/1945   Medicare Important Message Given:  Yes     Corey Harold 10/04/2019, 4:08 PM

## 2019-10-04 NOTE — Progress Notes (Signed)
Daughter has been with patient most of day. Dressed her in pajamas and got patient up to chair a bedside commode.  Voided and had bm.  Patient c/o back pain this morning and received order for oxycodone.  Patient is restless and confused.

## 2019-10-04 NOTE — Progress Notes (Signed)
MD notified of troponin 77, and pt has increased agitation, restless, and climbing out of the bed multiple times.

## 2019-10-04 NOTE — Progress Notes (Signed)
ICM remote transmission rescheduled from 10/07/2019 to 10/21/2019 due to patient is currently hospitalized.

## 2019-10-04 NOTE — Progress Notes (Signed)
Patient combative, climbing out of bed, pulling her central ine. Her gait is completely unsteady, she is at high risk for falls. Floor coverage advised of above  behaviors/ bed alarm initated. Provider has given order for Seroquel PO. Will continue to monitor patient as the shift progresses .

## 2019-10-05 ENCOUNTER — Inpatient Hospital Stay (HOSPITAL_COMMUNITY): Payer: 59

## 2019-10-05 DIAGNOSIS — R946 Abnormal results of thyroid function studies: Secondary | ICD-10-CM

## 2019-10-05 DIAGNOSIS — R410 Disorientation, unspecified: Secondary | ICD-10-CM

## 2019-10-05 LAB — COMPREHENSIVE METABOLIC PANEL
ALT: 36 U/L (ref 0–44)
AST: 51 U/L — ABNORMAL HIGH (ref 15–41)
Albumin: 2.7 g/dL — ABNORMAL LOW (ref 3.5–5.0)
Alkaline Phosphatase: 30 U/L — ABNORMAL LOW (ref 38–126)
Anion gap: 6 (ref 5–15)
BUN: 65 mg/dL — ABNORMAL HIGH (ref 8–23)
CO2: 18 mmol/L — ABNORMAL LOW (ref 22–32)
Calcium: 7.6 mg/dL — ABNORMAL LOW (ref 8.9–10.3)
Chloride: 114 mmol/L — ABNORMAL HIGH (ref 98–111)
Creatinine, Ser: 4.28 mg/dL — ABNORMAL HIGH (ref 0.44–1.00)
GFR calc Af Amer: 11 mL/min — ABNORMAL LOW (ref >60)
GFR calc non Af Amer: 10 mL/min — ABNORMAL LOW (ref >60)
Glucose, Bld: 144 mg/dL — ABNORMAL HIGH (ref 70–99)
Potassium: 3.8 mmol/L (ref 3.5–5.1)
Sodium: 138 mmol/L (ref 135–145)
Total Bilirubin: 0.8 mg/dL (ref 0.3–1.2)
Total Protein: 5.8 g/dL — ABNORMAL LOW (ref 6.5–8.1)

## 2019-10-05 LAB — CBC
HCT: 27 % — ABNORMAL LOW (ref 36.0–46.0)
Hemoglobin: 8.2 g/dL — ABNORMAL LOW (ref 12.0–15.0)
MCH: 30.7 pg (ref 26.0–34.0)
MCHC: 30.4 g/dL (ref 30.0–36.0)
MCV: 101.1 fL — ABNORMAL HIGH (ref 80.0–100.0)
Platelets: 158 10*3/uL (ref 150–400)
RBC: 2.67 MIL/uL — ABNORMAL LOW (ref 3.87–5.11)
RDW: 13.9 % (ref 11.5–15.5)
WBC: 9.2 10*3/uL (ref 4.0–10.5)
nRBC: 0 % (ref 0.0–0.2)

## 2019-10-05 MED ORDER — QUETIAPINE FUMARATE 25 MG PO TABS
25.0000 mg | ORAL_TABLET | Freq: Every day | ORAL | Status: DC
  Administered 2019-10-05: 25 mg via ORAL
  Filled 2019-10-05: qty 1

## 2019-10-05 MED ORDER — CARVEDILOL 3.125 MG PO TABS
3.1250 mg | ORAL_TABLET | Freq: Two times a day (BID) | ORAL | Status: DC
  Administered 2019-10-05 – 2019-10-06 (×2): 3.125 mg via ORAL
  Filled 2019-10-05 (×2): qty 1

## 2019-10-05 NOTE — Progress Notes (Signed)
Patient continues to be combative with care, attempting to pull CVC line , multiple attempts to get out of bed. She is unsteady and has generalized weakness, at great  risk for falls. Again informed provider of these behaviors, Order given for Haldol. I will administer and continue to monitor patient closely

## 2019-10-05 NOTE — Progress Notes (Signed)
PROGRESS NOTE    [name]  TOI:[id number] DOB: 05-26-1945 DOA: 09/30/2019 PCP: Georgeann Oppenheim, NP    Brief Narrative:   [name]  is a 74 y.o. female, with possible chronic hepatitis C. GERD, H. pylori gastritis. chronic combined systolic and diastolic heart failure, ICD, COPD, CAD, hypertension, obstructive sleep apnea, nonsustained ventricular tachycardia. LVEF 50% August 2019, grade 1 diastolic dysfunction and mild aortic stenosis. -Patient presents to ED today secondary to neck and back pain after falls, patient reports has been having unsteady gait, weakness and fatigue for last few days, patient was recently seen at [facility] for hematuria, her CT abdomen pelvis done with no acute findings, her creatinine was at 1.24, her UA did show some proteinuria and large bloods, she was treated with ciprofloxacin, urine cultures were not sent, patient presents to ED secondary to worsening fatigue, weakness, unsteady gait, she denies any chest pain, nausea or vomiting, denies any dysuria, but reports hematuria has resolved.  As well patiently was started to be seen recently by GI for diagnosis of chronic hepatitis C. - in ED her work-up was significant for creatinine of 2.6, potassium of 6.1, EKG showing prolonged QTC at 560, CT abdomen pelvis done showing no obstructive uropathy, no stones, no acute findings, currently her UA remains pending, LFTs elevated including AST of 132, total bili of 1.4, ALT within normal limit, [facility] hospitalist consulted to admit.   Assessment & Plan:   Active Problems:   HLD (hyperlipidemia)   HTN (hypertension)   VENTRICULAR TACHYCARDIA   Implantable cardioverter-defibrillator (ICD) in situ   Abnormal thyroid function test   Cardiomyopathy, ischemic   Chronic hepatitis C (HCC)   GERD (gastroesophageal reflux disease)   AKI (acute kidney injury) (HCC)   Delirium   1)Acute kidney injury--- suspect component of dehydration, rhabdomyolysis  compounded by ACE inhibitor use --Overall renal function improving, creatinine trending down --Creatinine currently down to 4.28 from 8.64 on 09/30/19 (baseline 1.2 (09/19/19)  her urinalysis shows blood and proteinuria.  She was taking Cipro prior to admission.   AIN less likely given lack of improvement after discontinuation of Cipro -Glomerulonephritis appears more likely, serologies Neg, S/p kidney biopsy on 10/03/2019, pathology pending -Decrease IV normal saline to 50 mL an hour, discussed with nephrology service -renally adjust medications, avoid nephrotoxic agents / dehydration  / hypotension -Oliguria persist, --- intermittent confusion may be opiate related rather than uremia  2)Hyperkalemia.  Secondary to acute kidney injury--- potassium improved currently 3.8, discontinued Lokelma  3)Rhabdomyolysis.  Mild elevation of CK secondary to immobilization on the floor, poor CK clearance due to AKI-- 2,469 >>3,290>>>1,038   Continue on IV fluids and monitor urine output  3)Hypertension.  Chronically on ACE inhibitor which is been discontinued. -Started Coreg 3.25 mg twice daily  4)Chronic Hep C---- Followed by GI as an outpatient,  =-AST is down to 51 from 132, ALT 36  T.bili 0.8  5)Peripheral arterial disease.  Chronically on Xarelto and statin.  These are currently on hold.  6)Nonsustained ventricular tachycardia status post ICD.  Followed by Dr. Johney Frame.  Okay to resume Coreg at 3.  1 2 5  mg twice daily on 10/05/2019  7)Leukocytosis noted-- urine culture, Blood culture NGTD -Leukocytosis resolved as of 10/05/2019  8) acute anemia--hemoglobin down to 8.2 from 11.9 on admission, suspect related to  hemodilution from IV fluids -??  If candidate for ESA/Procrit  9) acute metabolic encephalopathy--confusion, agitation and restlessness persist-- intermittent confusion may be opiate related rather than uremia --Stopped  opiates Seroquel/Haldol as ordered  10) elevated troponin- Episodes  of possible chest discomfort, borderline troponin noted, echo with preserved EF of 55 to 60%, grade 2 diastolic dysfunction without significant regional wall motion abnormalities -Curbside discussion with cardiology service, no further ischemia work-up planned at this time -Restarted Coreg on 10/05/2019   DVT prophylaxis: SCDs Start: 09/30/19 1918  Code Status: Full code Family Communication: Updated husband over the phone, greatly discussed with patient's daughter at bedside Disposition Plan: Status is: Inpatient  Remains inpatient appropriate because:Inpatient level of care appropriate due to severity of illness   Dispo: The patient is from: Home              Anticipated d/c is to: SNF              Anticipated d/c date is: 3 days              Patient currently is not medically stable to d/c.  Patient needs continued work-up/treatment of renal failure, need for IV fluids and awaiting renal biopsy   Consultants:   Nephrology  Procedures:   Renal biopsy 10/03/2019 Right IJ central line 10/03/2019 Antimicrobials:      Subjective: -Patient son at bedside, nonsustained V. Tach, -Episodes of confusion and agitation overnight, required Seroquel and Haldol - Objective: Vitals:   10/04/19 1022 10/04/19 1737 10/05/19 0544 10/05/19 0547  BP:  (!) 114/55 124/61   Pulse:  93 97   Resp:  16    Temp:  98.6 F (37 C)  (!) 97.5 F (36.4 C)  TempSrc:  Oral  Oral  SpO2: 98% 91% 100%   Weight:      Height:       No intake or output data in the 24 hours ending 10/05/19 1202 Filed Weights   09/30/19 1118  Weight: 74.8 kg    Examination:  General exam: In no acute distress, Neck-right IJ central line Respiratory system: Clear to auscultation. Respiratory effort normal. Cardiovascular system: S1 & S2 heard, RRR.  AICD in situ Gastrointestinal system: Abdomen is nondistended, soft and nontender.  Normal bowel sounds heard.  No CVA area tenderness Central nervous system:  Generalized weakness, no focal neurological deficits. Extremities: Symmetric 5 x 5 power. Skin: No rashes, lesions or ulcers Psychiatry: Occasional episodes of confusion and disorientation  Data Reviewed:    CBC: Recent Labs  Lab 10/01/19 0356 10/02/19 0615 10/03/19 0724 10/04/19 0237 10/05/19 0915  WBC 11.0* 12.1* 12.1* 12.8* 9.2  NEUTROABS  --   --  9.5*  --   --   HGB 9.9* 10.1* 9.5* 9.0* 8.2*  HCT 35.0* 35.5* 32.8* 30.3* 27.0*  MCV 103.9* 102.0* 101.9* 100.7* 101.1*  PLT 171 171 153 194 158   Basic Metabolic Panel: Recent Labs  Lab 09/30/19 1739 09/30/19 2035 10/01/19 0356 10/02/19 0615 10/03/19 0724 10/04/19 0237 10/05/19 0915  NA  --    < > 136 138 137 140 138  K  --    < > 4.9 5.2* 4.6 4.6 3.8  CL  --    < > 106 109 113* 115* 114*  CO2  --    < > 17* 17* 15* 17* 18*  GLUCOSE  --    < > 70 107* 90 112* 144*  BUN  --    < > 81* 78* 76* 73* 65*  CREATININE  --    < > 8.06* 7.51* 6.21* 5.40* 4.28*  CALCIUM  --    < > 7.2* 7.6* 7.9*  8.0* 7.6*  MG  --   --   --   --  1.8  --   --   PHOS 7.7*  --  7.3* 6.4*  --  5.0*  --    < > = values in this interval not displayed.   GFR: Estimated Creatinine Clearance: 10.6 mL/min (A) (by C-G formula based on SCr of 4.28 mg/dL (H)). Liver Function Tests: Recent Labs  Lab 09/30/19 1249 09/30/19 1249 10/01/19 0356 10/02/19 0615 10/03/19 0724 10/04/19 0237 10/05/19 0915  AST 132*  --  103*  --  92*  --  51*  ALT 39  --  34  --  41  --  36  ALKPHOS 42  --  35*  --  32*  --  30*  BILITOT 1.4*  --  1.0  --  1.1  --  0.8  PROT 7.3  --  6.0*  --  6.4*  --  5.8*  ALBUMIN 3.5   < > 3.0* 3.3* 3.1* 3.0* 2.7*   < > = values in this interval not displayed.   Recent Labs  Lab 09/30/19 1249  LIPASE 41   No results for input(s): AMMONIA in the last 168 hours. Coagulation Profile: Recent Labs  Lab 09/30/19 1739 10/01/19 0356  INR 1.2 1.1   Cardiac Enzymes: Recent Labs  Lab 09/30/19 1249 10/01/19 1235 10/03/19 0724    CKTOTAL 2,469* 3,290* 1,038*   BNP (last 3 results) No results for input(s): PROBNP in the last 8760 hours. HbA1C: No results for input(s): HGBA1C in the last 72 hours. CBG: No results for input(s): GLUCAP in the last 168 hours. Lipid Profile: No results for input(s): CHOL, HDL, LDLCALC, TRIG, CHOLHDL, LDLDIRECT in the last 72 hours. Thyroid Function Tests: No results for input(s): TSH, T4TOTAL, FREET4, T3FREE, THYROIDAB in the last 72 hours. Anemia Panel: No results for input(s): VITAMINB12, FOLATE, FERRITIN, TIBC, IRON, RETICCTPCT in the last 72 hours. Sepsis Labs: No results for input(s): PROCALCITON, LATICACIDVEN in the last 168 hours.  Recent Results (from the past 240 hour(s))  SARS Coronavirus 2 by RT PCR (hospital order, performed in [facility] hospital lab) Nasopharyngeal Nasopharyngeal Swab     Status: None   Collection Time: 09/30/19  4:28 PM   Specimen: Nasopharyngeal Swab  Result Value Ref Range Status   SARS Coronavirus 2 NEGATIVE NEGATIVE Final    Comment: (NOTE) SARS-CoV-2 target nucleic acids are NOT DETECTED.  The SARS-CoV-2 RNA is generally detectable in upper and lower respiratory specimens during the acute phase of infection. The lowest concentration of SARS-CoV-2 viral copies this assay can detect is 250 copies / mL. A negative result does not preclude SARS-CoV-2 infection and should not be used as the sole basis for treatment or other patient management decisions.  A negative result may occur with improper specimen collection / handling, submission of specimen other than nasopharyngeal swab, presence of viral mutation(s) within the areas targeted by this assay, and inadequate number of viral copies (<250 copies / mL). A negative result must be combined with clinical observations, patient history, and epidemiological information.  Fact Sheet for Patients:   BoilerBrush.com.cy  Fact Sheet for Healthcare  Providers: https://pope.com/  This test is not yet approved or  cleared by the [place] FDA and has been authorized for detection and/or diagnosis of SARS-CoV-2 by FDA under an Emergency Use Authorization (EUA).  This EUA will remain in effect (meaning this test can be used) for the duration of the COVID-19 declaration  under Section 564(b)(1) of the Act, 21 U.S.C. section 360bbb-3(b)(1), unless the authorization is terminated or revoked sooner.  Performed at [facility], [address] [place], [place] [address]   Culture, blood (Routine X 2) w Reflex to ID Panel     Status: None (Preliminary result)   Collection Time: 10/02/19  6:19 PM   Specimen: BLOOD RIGHT WRIST  Result Value Ref Range Status   Specimen Description BLOOD RIGHT WRIST  Final   Special Requests   Final    BOTTLES DRAWN AEROBIC ONLY Blood Culture results may not be optimal due to an inadequate volume of blood received in culture bottles   Culture   Final    NO GROWTH 2 DAYS Performed at [facility], [address] [place], [place] [address]    Report Status PENDING  Incomplete  Culture, blood (Routine X 2) w Reflex to ID Panel     Status: None (Preliminary result)   Collection Time: 10/02/19  9:08 PM   Specimen: BLOOD LEFT HAND  Result Value Ref Range Status   Specimen Description BLOOD LEFT HAND  Final   Special Requests   Final    BOTTLES DRAWN AEROBIC AND ANAEROBIC Blood Culture adequate volume   Culture   Final    NO GROWTH 2 DAYS Performed at [facility], [address] [place], [place] [address]    Report Status PENDING  Incomplete  Urine Culture     Status: Abnormal   Collection Time: 10/02/19 11:00 PM   Specimen: Urine, Clean Catch  Result Value Ref Range Status   Specimen Description   Final    URINE, CLEAN CATCH Performed at [facility], [address] [place], [place] [address]    Special Requests   Final    Normal Performed at [facility], [address] [place], [place] [address]    Culture (A)  Final    <10,000 COLONIES/mL INSIGNIFICANT GROWTH Performed at [facility], [address] [place], [place] [address]    Report Status 10/04/2019 FINAL  Final    Radiology Studies: DG CHEST PORT 1 VIEW  Result Date: 10/04/2019 CLINICAL DATA:  Shortness of breath EXAM: PORTABLE CHEST 1 VIEW COMPARISON:  09/13/2017 FINDINGS: 0939 hours. The cardio pericardial silhouette is enlarged. Interstitial markings are diffusely coarsened with chronic features. Right IJ central line tip overlies the distal SVC level. Left-sided pacer/AICD again noted. Bones are diffusely demineralized. Fixation hardware in the left humerus has been incompletely visualized. Telemetry leads overlie the chest. IMPRESSION: Cardiomegaly with chronic interstitial opacity. No acute cardiopulmonary findings. Electronically Signed   By: Kennith CenterEric  Mansell M.D.   On: 10/04/2019 09:56   ECHOCARDIOGRAM COMPLETE  Result Date: 10/04/2019    ECHOCARDIOGRAM REPORT   Patient Name:   Nolon LennertMICKEY H Markowicz Date of Exam: 10/04/2019 Medical Rec #:  [id number]         Height:       60.0 in Accession #:    [id number]        Weight:       165.0 lb Date of Birth:  10/27/1945        BSA:          1.720 m Patient Age:    73 years          BP:           107/48 mmHg Patient Gender: F                 HR:  95 bpm. [facility] Procedure: 2D Echo Indications:    Dyspnea 786.09 / R06.00  History:        Patient has prior history of Echocardiogram examinations, most                 recent 09/07/2017. Defibrillator, Signs/Symptoms:Chest Pain; Risk                 Factors:Dyslipidemia, Hypertension and Current Smoker. Chronic                 hepatitis C.  Sonographer:    Jeryl Columbia RDCS (AE) Referring Phys: [id number] Helen Winterhalter IMPRESSIONS  1. Left ventricular ejection fraction, by estimation, is 55 to 60%. The left ventricle has normal function. The left ventricle has no regional wall motion  abnormalities. There is mild left ventricular hypertrophy. Left ventricular diastolic parameters are consistent with Grade II diastolic dysfunction (pseudonormalization).  2. Right ventricular systolic function is normal. The right ventricular size is normal. A device wire is visualized. There is mildly elevated pulmonary artery systolic pressure. The estimated right ventricular systolic pressure is 37.4 mmHg.  3. The mitral valve is grossly normal. Mild mitral valve regurgitation.  4. The aortic valve was not well visualized, moderately calcified. Aortic valve regurgitation is mild. Mild aortic valve stenosis. Aortic valve mean gradient measures 15.3 mmHg. Aortic valve Vmax measures 2.66 m/s.  5. The inferior vena cava is normal in size with <50% respiratory variability, suggesting right atrial pressure of 8 mmHg. FINDINGS  Left Ventricle: Left ventricular ejection fraction, by estimation, is 55 to 60%. The left ventricle has normal function. The left ventricle has no regional wall motion abnormalities. The left ventricular internal cavity size was normal in size. There is  mild left ventricular hypertrophy. Left ventricular diastolic parameters are consistent with Grade II diastolic dysfunction (pseudonormalization). Right Ventricle: The right ventricular size is normal. No increase in right ventricular wall thickness. Right ventricular systolic function is normal. There is mildly elevated pulmonary artery systolic pressure. The tricuspid regurgitant velocity is 2.71  m/s, and with an assumed right atrial pressure of 8 mmHg, the estimated right ventricular systolic pressure is 37.4 mmHg. Left Atrium: Left atrial size was normal in size. Right Atrium: Right atrial size was normal in size. Pericardium: There is no evidence of pericardial effusion. Presence of pericardial fat pad. Mitral Valve: The mitral valve is grossly normal. There is mild thickening of the mitral valve leaflet(s). Mild to moderate mitral annular  calcification. Mild mitral valve regurgitation. Tricuspid Valve: The tricuspid valve is grossly normal. Tricuspid valve regurgitation is mild. Aortic Valve: The aortic valve was not well visualized. Aortic valve regurgitation is mild. Aortic regurgitation PHT measures 293 msec. Mild aortic stenosis is present. There is moderate calcification of the aortic valve. Aortic valve mean gradient measures 15.3 mmHg. Aortic valve peak gradient measures 28.3 mmHg. Pulmonic Valve: The pulmonic valve was not well visualized. Pulmonic valve regurgitation is trivial. Aorta: The aortic root is normal in size and structure. Venous: The inferior vena cava is normal in size with less than 50% respiratory variability, suggesting right atrial pressure of 8 mmHg. IAS/Shunts: No atrial level shunt detected by color flow Doppler. Additional Comments: A device wire is visualized.  LEFT VENTRICLE PLAX 2D LVIDd:         4.50 cm Diastology LVIDs:         3.14 cm LV e' lateral:   6.31 cm/s LV PW:  1.36 cm LV E/e' lateral: 20.3 LV IVS:        1.16 cm LV e' medial:    6.53 cm/s                        LV E/e' medial:  19.6  RIGHT VENTRICLE RV S prime:     14.10 cm/s TAPSE (M-mode): 2.0 cm LEFT ATRIUM             Index       RIGHT ATRIUM           Index LA diam:        3.10 cm 1.80 cm/m  RA Area:     13.00 cm LA Vol (A2C):   51.7 ml 30.06 ml/m RA Volume:   32.90 ml  19.13 ml/m LA Vol (A4C):   21.7 ml 12.62 ml/m LA Biplane Vol: 36.4 ml 21.16 ml/m  AORTIC VALVE AV Vmax:           266.00 cm/s AV Vmean:          186.000 cm/s AV VTI:            0.602 m AV Peak Grad:      28.3 mmHg AV Mean Grad:      15.3 mmHg LVOT Vmax:         60.03 cm/s LVOT Vmean:        43.300 cm/s LVOT VTI:          0.124 m LVOT/AV VTI ratio: 0.21 AI PHT:            293 msec  AORTA Ao Root diam: 2.60 cm MITRAL VALVE                TRICUSPID VALVE MV Area (PHT): 4.44 cm     TR Peak grad:   29.4 mmHg MV Decel Time: 171 msec     TR Vmax:        271.00 cm/s MR Peak grad:  85.0 mmHg MR Vmax:      461.00 cm/s   SHUNTS MV E velocity: 128.00 cm/s  Systemic VTI: 0.12 m MV A velocity: 107.00 cm/s MV E/A ratio:  1.20 Nona Dell MD Electronically signed by Nona Dell MD Signature Date/Time: 10/04/2019/10:48:45 AM    Final     Scheduled Meds: . Chlorhexidine Gluconate Cloth  6 each Topical Daily  . pantoprazole  40 mg Oral Daily  . QUEtiapine  25 mg Oral QHS  . tamsulosin  0.4 mg Oral QPC supper   Continuous Infusions: . sodium chloride 125 mL/hr at 10/05/19 1000     LOS: 5 days   Shon Hale, MD [facility]   If 7PM-7AM, please contact night-coverage [email or web address]  10/05/2019, 12:02 PM

## 2019-10-06 LAB — CBC
HCT: 27.4 % — ABNORMAL LOW (ref 36.0–46.0)
Hemoglobin: 8.2 g/dL — ABNORMAL LOW (ref 12.0–15.0)
MCH: 30.1 pg (ref 26.0–34.0)
MCHC: 29.9 g/dL — ABNORMAL LOW (ref 30.0–36.0)
MCV: 100.7 fL — ABNORMAL HIGH (ref 80.0–100.0)
Platelets: 185 10*3/uL (ref 150–400)
RBC: 2.72 MIL/uL — ABNORMAL LOW (ref 3.87–5.11)
RDW: 14.1 % (ref 11.5–15.5)
WBC: 9 10*3/uL (ref 4.0–10.5)
nRBC: 0 % (ref 0.0–0.2)

## 2019-10-06 LAB — RENAL FUNCTION PANEL
Albumin: 2.8 g/dL — ABNORMAL LOW (ref 3.5–5.0)
Anion gap: 8 (ref 5–15)
BUN: 60 mg/dL — ABNORMAL HIGH (ref 8–23)
CO2: 17 mmol/L — ABNORMAL LOW (ref 22–32)
Calcium: 7.8 mg/dL — ABNORMAL LOW (ref 8.9–10.3)
Chloride: 116 mmol/L — ABNORMAL HIGH (ref 98–111)
Creatinine, Ser: 3.58 mg/dL — ABNORMAL HIGH (ref 0.44–1.00)
GFR calc Af Amer: 14 mL/min — ABNORMAL LOW (ref >60)
GFR calc non Af Amer: 12 mL/min — ABNORMAL LOW (ref >60)
Glucose, Bld: 91 mg/dL (ref 70–99)
Phosphorus: 4.1 mg/dL (ref 2.5–4.6)
Potassium: 3.8 mmol/L (ref 3.5–5.1)
Sodium: 141 mmol/L (ref 135–145)

## 2019-10-06 MED ORDER — ALBUTEROL SULFATE (2.5 MG/3ML) 0.083% IN NEBU: 2.5000 mg | INHALATION_SOLUTION | Freq: Four times a day (QID) | RESPIRATORY_TRACT | Status: DC | PRN

## 2019-10-06 NOTE — Progress Notes (Signed)
Patient requesting to leave Against medical advice. Risks of leaving AMA have been discussed with patient including worsening of symptoms and even death. MD has also discussed leaving against AMA with patient and her daughter and patient is still insistent on leaving against medical advice. Document signed and is in patient's chart.

## 2019-10-06 NOTE — Progress Notes (Signed)
Right IJ line removed per pt and family request with MD [employer] approval. (Pt leaving AMA). Sutures removed, line removed and vaseline guaze and folded 4X4 guaze applied over site, pressure held for 7 minutes to site. Dressing secured with tape. Pt tolerated well. Line measured at 15cm upon removal. Pt and family advised to leave dressing in place for 24 hours and to not shower or bathe till dressing is removed tomorrow. Both pt and daughter state understanding.

## 2019-10-06 NOTE — Discharge Summary (Addendum)
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 Left ventricular diastolic parameters are consistent with Grade II diastolic dysfunction (pseudonormalization).  2. Right ventricular systolic function is normal. The right ventricular size is normal. A device wire is visualized. There is mildly elevated pulmonary artery systolic pressure. The estimated right ventricular systolic pressure is 37.4 mmHg.  3. The mitral valve is grossly normal. Mild mitral valve regurgitation.  4. The aortic valve was not well visualized, moderately calcified. Aortic valve regurgitation is mild. Mild aortic valve stenosis. Aortic valve mean gradient measures 15.3 mmHg. Aortic valve Vmax measures 2.66 m/s.  5. The inferior vena cava is normal in size with <50% respiratory variability, suggesting right atrial pressure of 8 mmHg. FINDINGS  Left Ventricle: Left ventricular ejection fraction, by estimation, is 55 to 60%. The left ventricle has normal function. The left ventricle has no regional wall motion  abnormalities. The left ventricular internal cavity size was normal in size. There is  mild left ventricular hypertrophy. Left ventricular diastolic parameters are consistent with Grade II diastolic dysfunction (pseudonormalization). Right Ventricle: The right ventricular size is normal. No increase in right ventricular wall thickness. Right ventricular systolic function is normal. There is mildly elevated pulmonary artery systolic pressure. The tricuspid regurgitant velocity is 2.71  m/s, and with an assumed right atrial pressure of 8 mmHg, the estimated right ventricular systolic pressure is 37.4 mmHg. Left Atrium: Left atrial size was normal in size. Right Atrium: Right atrial size was normal in size. Pericardium: There is no evidence of pericardial effusion. Presence of pericardial fat pad. Mitral Valve: The mitral valve is grossly normal. There is mild thickening of the mitral valve leaflet(s). Mild to moderate mitral annular calcification. Mild mitral valve regurgitation. Tricuspid Valve: The tricuspid valve  is grossly normal. Tricuspid valve regurgitation is mild. Aortic Valve: The aortic valve was not well visualized. Aortic valve regurgitation is mild. Aortic regurgitation PHT measures 293 msec. Mild aortic stenosis is present. There is moderate calcification of the aortic valve. Aortic valve mean gradient measures 15.3 mmHg. Aortic valve peak gradient measures 28.3 mmHg. Pulmonic Valve: The pulmonic valve was not well visualized. Pulmonic valve regurgitation is trivial. Aorta: The aortic root is normal in size and structure. Venous: The inferior vena cava is normal in size with less than 50% respiratory variability, suggesting right atrial pressure of 8 mmHg. IAS/Shunts: No atrial level shunt detected by color flow Doppler. Additional Comments: A device wire is visualized.  LEFT VENTRICLE PLAX 2D LVIDd:         4.50 cm Diastology LVIDs:         3.14 cm LV e' lateral:   6.31 cm/s LV PW:         1.36 cm LV  E/e' lateral: 20.3 LV IVS:        1.16 cm LV e' medial:    6.53 cm/s                        LV E/e' medial:  19.6  RIGHT VENTRICLE RV S prime:     14.10 cm/s TAPSE (M-mode): 2.0 cm LEFT ATRIUM             Index       RIGHT ATRIUM           Index LA diam:        3.10 cm 1.80 cm/m  RA Area:     13.00 cm LA Vol (A2C):   51.7 ml 30.06 ml/m RA Volume:   32.90 ml  19.13 ml/m LA Vol (A4C):   21.7 ml 12.62 ml/m LA Biplane Vol: 36.4 ml 21.16 ml/m  AORTIC VALVE AV Vmax:           266.00 cm/s AV Vmean:          186.000 cm/s AV VTI:            0.602 m AV Peak Grad:      28.3 mmHg AV Mean Grad:      15.3 mmHg LVOT Vmax:         60.03 cm/s LVOT Vmean:        43.300 cm/s LVOT VTI:          0.124 m LVOT/AV VTI ratio: 0.21 AI PHT:            293 msec  AORTA Ao Root diam: 2.60 cm MITRAL VALVE                TRICUSPID VALVE MV Area (PHT): 4.44 cm     TR Peak grad:   29.4 mmHg MV Decel Time: 171 msec     TR Vmax:        271.00 cm/s MR Peak grad: 85.0 mmHg MR Vmax:      461.00 cm/s   SHUNTS MV E velocity: 128.00 cm/s  Systemic VTI: 0.12 m MV A velocity: 107.00 cm/s MV E/A ratio:  1.20 Nona Dell MD Electronically signed by Nona Dell MD Signature Date/Time: 10/04/2019/10:48:45 AM    Final    CUP PACEART REMOTE DEVICE CHECK  Result Date: 09/23/2019 Scheduled remote reviewed. Normal device function.  1 NSVT episode Next remote 91 days.   Micro Results     Recent Results (from the past 240 hour(s))  SARS Coronavirus 2 by RT PCR (hospital order, performed in [facility] hospital lab) Nasopharyngeal Nasopharyngeal Swab     Status: None   Collection Time: 09/30/19  4:28 PM   Specimen: Nasopharyngeal Swab  Result Value Ref Range Status   SARS Coronavirus 2 NEGATIVE NEGATIVE Final    Comment: (NOTE) SARS-CoV-2 target nucleic acids are NOT DETECTED.  The SARS-CoV-2 RNA is generally detectable in upper and lower respiratory specimens during the acute phase of infection. The lowest concentration of SARS-CoV-2  viral copies this assay can detect is 250 copies / mL. A negative result does not preclude SARS-CoV-2 infection and should not be used as the sole basis for treatment or other patient management decisions.  A negative result may occur with improper specimen collection / handling, submission of specimen other than nasopharyngeal swab, presence of viral mutation(s) within the areas targeted by this assay, and inadequate number of viral copies (<250 copies / mL). A negative result must be combined with clinical observations, patient history, and epidemiological information.  Fact Sheet for Patients:   BoilerBrush.com.cy  Fact Sheet for Healthcare Providers: https://pope.com/  This test is not yet approved or  cleared by the [place] FDA and has been authorized for detection and/or diagnosis of SARS-CoV-2 by FDA under an Emergency Use Authorization (EUA).  This EUA will remain in effect (meaning this test can be used) for the duration of the COVID-19 declaration under Section 564(b)(1) of the Act, 21 U.S.C. section 360bbb-3(b)(1), unless the authorization is terminated or revoked sooner.  Performed at [facility], [address], [place], [place] [address]   Culture, blood (Routine X 2) w Reflex to ID Panel     Status: None (Preliminary result)   Collection Time: 10/02/19  6:19 PM   Specimen: BLOOD RIGHT WRIST  Result Value Ref Range Status   Specimen Description BLOOD RIGHT WRIST  Final   Special Requests   Final    BOTTLES DRAWN AEROBIC ONLY Blood Culture results may not be optimal due to an inadequate volume of blood received in culture bottles   Culture   Final    NO GROWTH 4 DAYS Performed at [facility], [address] [place], [place] [address]    Report Status PENDING  Incomplete  Culture, blood (Routine X 2) w Reflex to ID Panel     Status: None (Preliminary result)   Collection Time: 10/02/19  9:08 PM   Specimen: BLOOD  LEFT HAND  Result Value Ref Range Status   Specimen Description BLOOD LEFT HAND  Final   Special Requests   Final    BOTTLES DRAWN AEROBIC AND ANAEROBIC Blood Culture adequate volume   Culture   Final    NO GROWTH 4 DAYS Performed at [facility], [address] [place], [place] [address]    Report Status PENDING  Incomplete  Urine Culture     Status: Abnormal   Collection Time: 10/02/19 11:00 PM   Specimen: Urine, Clean Catch  Result Value Ref Range Status   Specimen Description   Final    URINE, CLEAN CATCH Performed at [facility], [address] [place], [place] [address]    Special Requests   Final    Normal Performed at [facility], [address] [place], [place] [address]    Culture (A)  Final    <10,000 COLONIES/mL INSIGNIFICANT GROWTH Performed at [facility], [address] [place], [place] [address]    Report Status 10/04/2019 FINAL  Final  Today   Subjective    [name]  ---Patient's confusion and disorientation resolved with narcotic/opiate holiday -Patient was alert and oriented x3 today, daughter at bedside, patient answer questions appropriately able to repeat information accurately -Patient requesting narcotics--patient advised she has significant confusion with narcotics and would like to avoid narcotics at this time to avoid repeat of disorientation and delirium that she had for the last couple days  -Patient's daughter requesting diuretics--advised that we will stop IV fluids but hold off on diuretics for now to allow for further renal recovery --- Patient left AGAINST MEDICAL ADVICE with her daughter despite persuasion to the contrary--patient states that she has Subutex at home--patient insisting that she wants narcotics--she left AMA   Patient has been seen and examined prior to discharge   Objective   Blood pressure 117/62, pulse 74, temperature 97.7 F (36.5 C), resp. rate 16, height 5' (1.524 m), weight 74.8 kg, SpO2 94  %.   Intake/Output Summary (Last 24 hours) at 10/06/2019 1147 Last data filed at 10/05/2019 1833 Gross per 24 hour  Intake 360 ml  Output --  Net 360 ml   Exam Gen:- Awake Alert, no acute distress, coherent HEENT:- [email or web address].AT, No sclera icterus Neck-Supple Neck, right IJ central line removed prior to discharge  lungs-improved air movement bilaterally no wheezing* CV- S1, S2 normal, regular, left subclavian AICD in situ Abd-  +ve B.Sounds, Abd Soft, No tenderness,  Extremity/Skin:-2+ pitting edema,   good pulses Psych-affect is appropriate, oriented x3, no further confusion or disorientation Neuro-generalized weakness, no new focal deficits, no tremors    Data Review   CBC w Diff:  Lab Results  Component Value Date   WBC 9.0 10/06/2019   HGB 8.2 (L) 10/06/2019   HCT 27.4 (L) 10/06/2019   PLT 185 10/06/2019   LYMPHOPCT 15 10/03/2019   MONOPCT 6 10/03/2019   EOSPCT 0 10/03/2019   BASOPCT 0 10/03/2019    CMP:  Lab Results  Component Value Date   NA 141 10/06/2019   K 3.8 10/06/2019   CL 116 (H) 10/06/2019   CO2 17 (L) 10/06/2019   BUN 60 (H) 10/06/2019   CREATININE 3.58 (H) 10/06/2019   CREATININE 1.02 (H) 09/11/2017   PROT 5.8 (L) 10/05/2019   ALBUMIN 2.8 (L) 10/06/2019   BILITOT 0.8 10/05/2019   ALKPHOS 30 (L) 10/05/2019   AST 51 (H) 10/05/2019   ALT 36 10/05/2019  . -Patient's confusion and disorientation resolved with narcotic/opiate holiday -Patient was alert and oriented x3 today, daughter at bedside, patient answer questions appropriately able to repeat information accurately -Patient requesting narcotics--patient advised she has significant confusion with narcotics and would like to avoid narcotics at this time to avoid repeat of disorientation and delirium that she had for the last couple days  -Patient's daughter requesting diuretics--advised that we will stop IV fluids but hold off on diuretics for now to allow for further renal recovery --- Patient left  AGAINST MEDICAL ADVICE with her daughter despite persuasion to the contrary--patient states that she has Subutex at home--patient insisting that she wants narcotics--she left AMA  Total Discharge time is about 33 minutes  Shon Hale M.D on 10/06/2019 at 11:47 AM  Go to [email or web address] -  for contact info  [facility] - Office  [phone]

## 2019-10-06 NOTE — Progress Notes (Signed)
Patient has order for Cardiac monitoring and has refused. Per patient's daughter her mother has refused cardiac monitoring and she "took it off her mother" MD has been notified.

## 2019-10-07 LAB — IMMUNOFIXATION ELECTROPHORESIS
IgA: 138 mg/dL (ref 64–422)
IgG (Immunoglobin G), Serum: 1263 mg/dL (ref 586–1602)
IgM (Immunoglobulin M), Srm: 68 mg/dL (ref 26–217)
Total Protein ELP: 5.9 g/dL — ABNORMAL LOW (ref 6.0–8.5)

## 2019-10-07 LAB — CULTURE, BLOOD (ROUTINE X 2)
Culture: NO GROWTH
Culture: NO GROWTH
Special Requests: ADEQUATE

## 2019-10-09 ENCOUNTER — Telehealth: Payer: Self-pay | Admitting: Family Medicine

## 2019-10-09 LAB — HCV AB W REFLEX TO QUANT PCR: HCV Ab: >11 s/co ratio — ABNORMAL HIGH (ref 0.0–0.9)

## 2019-10-09 LAB — HCV RT-PCR, QUANT (NON-GRAPH)
HCV log10: 5.813 log10 IU/mL
Hepatitis C Quantitation: 650000 IU/mL

## 2019-10-09 NOTE — Telephone Encounter (Signed)
Patient was recently admitted at [facility] and they took her off her furosemide (LASIX) 20 MG tablet [[id number]]  And she's starting to swell please call [phone]

## 2019-10-10 ENCOUNTER — Telehealth: Payer: Self-pay | Admitting: Gastroenterology

## 2019-10-10 ENCOUNTER — Encounter: Payer: Self-pay | Admitting: Internal Medicine

## 2019-10-10 NOTE — Telephone Encounter (Signed)
Patient returned call to Carlye Grippe, RN  Please call patient at home.

## 2019-10-10 NOTE — Telephone Encounter (Signed)
Patient needs follow up ov for hep C. Was not addressed at last ov due to acute illness requiring hospitalization.   Per Dr. Jena Gauss, he will take on patient per patient's request.

## 2019-10-10 NOTE — Telephone Encounter (Signed)
Pt c/o increased swelling and requested that we increase her lasix and send her in albuterol - pt made aware that per recent ED stay that she was to hold lasix to see if this would help her renal function and pt left AMA - pt aware to go back to ED or urgent care for swelling and medication and contact pcp for albuterol rx (pt says she has an appt 9/7) and would go to urgent care or ED for symptoms

## 2019-10-28 NOTE — Progress Notes (Signed)
No ICM remote transmission received for 10/21/2019 and next ICM transmission scheduled for 11/13/2019.   

## 2019-11-08 ENCOUNTER — Encounter: Payer: 59 | Admitting: Internal Medicine

## 2019-11-08 DEATH — deceased

## 2019-12-03 ENCOUNTER — Ambulatory Visit: Payer: 59 | Admitting: Gastroenterology

## 2019-12-05 ENCOUNTER — Encounter (HOSPITAL_COMMUNITY): Payer: Self-pay | Admitting: Internal Medicine

## 2019-12-05 ENCOUNTER — Ambulatory Visit: Payer: 59 | Admitting: Family Medicine

## 2019-12-19 ENCOUNTER — Encounter (HOSPITAL_COMMUNITY): Payer: Self-pay | Admitting: Internal Medicine

## 2020-04-22 LAB — SURGICAL PATHOLOGY

## 2020-04-23 ENCOUNTER — Encounter (HOSPITAL_COMMUNITY): Payer: Self-pay | Admitting: Nephrology
